# Patient Record
Sex: Female | Born: 1985 | Race: White | Hispanic: No | Marital: Married | Smoking: Current every day smoker
Health system: Southern US, Community
[De-identification: ages and names within clinical notes are randomized; demographics above are authoritative.]

## PROBLEM LIST (undated history)

## (undated) ENCOUNTER — Inpatient Hospital Stay (HOSPITAL_COMMUNITY): Payer: Self-pay

## (undated) DIAGNOSIS — I499 Cardiac arrhythmia, unspecified: Secondary | ICD-10-CM

## (undated) DIAGNOSIS — I471 Supraventricular tachycardia, unspecified: Secondary | ICD-10-CM

## (undated) DIAGNOSIS — D649 Anemia, unspecified: Secondary | ICD-10-CM

## (undated) DIAGNOSIS — R000 Tachycardia, unspecified: Secondary | ICD-10-CM

## (undated) DIAGNOSIS — N200 Calculus of kidney: Secondary | ICD-10-CM

## (undated) DIAGNOSIS — E0590 Thyrotoxicosis, unspecified without thyrotoxic crisis or storm: Secondary | ICD-10-CM

## (undated) DIAGNOSIS — F419 Anxiety disorder, unspecified: Secondary | ICD-10-CM

## (undated) DIAGNOSIS — F32A Depression, unspecified: Secondary | ICD-10-CM

## (undated) DIAGNOSIS — R002 Palpitations: Secondary | ICD-10-CM

## (undated) DIAGNOSIS — R12 Heartburn: Secondary | ICD-10-CM

## (undated) DIAGNOSIS — Z8742 Personal history of other diseases of the female genital tract: Secondary | ICD-10-CM

## (undated) DIAGNOSIS — Z9889 Other specified postprocedural states: Secondary | ICD-10-CM

## (undated) DIAGNOSIS — O26899 Other specified pregnancy related conditions, unspecified trimester: Secondary | ICD-10-CM

## (undated) DIAGNOSIS — F329 Major depressive disorder, single episode, unspecified: Secondary | ICD-10-CM

## (undated) DIAGNOSIS — I10 Essential (primary) hypertension: Secondary | ICD-10-CM

## (undated) HISTORY — DX: Depression, unspecified: F32.A

## (undated) HISTORY — DX: Supraventricular tachycardia, unspecified: I47.10

## (undated) HISTORY — DX: Calculus of kidney: N20.0

## (undated) HISTORY — DX: Anxiety disorder, unspecified: F41.9

## (undated) HISTORY — DX: Tachycardia, unspecified: R00.0

## (undated) HISTORY — DX: Palpitations: R00.2

## (undated) HISTORY — DX: Essential (primary) hypertension: I10

## (undated) HISTORY — PX: ELBOW SURGERY: SHX618

## (undated) HISTORY — DX: Supraventricular tachycardia: I47.1

## (undated) HISTORY — DX: Major depressive disorder, single episode, unspecified: F32.9

---
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
## 2003-01-08 ENCOUNTER — Ambulatory Visit (HOSPITAL_COMMUNITY): Admission: RE | Admit: 2003-01-08 | Discharge: 2003-01-08 | Payer: Self-pay | Admitting: *Deleted

## 2003-02-07 ENCOUNTER — Inpatient Hospital Stay (HOSPITAL_COMMUNITY): Admission: AD | Admit: 2003-02-07 | Discharge: 2003-02-07 | Payer: Self-pay | Admitting: *Deleted

## 2003-03-01 ENCOUNTER — Inpatient Hospital Stay (HOSPITAL_COMMUNITY): Admission: AD | Admit: 2003-03-01 | Discharge: 2003-03-01 | Payer: Self-pay | Admitting: Obstetrics and Gynecology

## 2003-03-03 DIAGNOSIS — I10 Essential (primary) hypertension: Secondary | ICD-10-CM

## 2003-03-03 HISTORY — DX: Essential (primary) hypertension: I10

## 2003-03-12 ENCOUNTER — Ambulatory Visit (HOSPITAL_COMMUNITY): Admission: RE | Admit: 2003-03-12 | Discharge: 2003-03-12 | Payer: Self-pay | Admitting: *Deleted

## 2003-04-15 ENCOUNTER — Inpatient Hospital Stay (HOSPITAL_COMMUNITY): Admission: AD | Admit: 2003-04-15 | Discharge: 2003-04-15 | Payer: Self-pay | Admitting: *Deleted

## 2003-05-21 ENCOUNTER — Inpatient Hospital Stay (HOSPITAL_COMMUNITY): Admission: AD | Admit: 2003-05-21 | Discharge: 2003-05-21 | Payer: Self-pay | Admitting: Obstetrics & Gynecology

## 2003-05-24 ENCOUNTER — Encounter: Admission: RE | Admit: 2003-05-24 | Discharge: 2003-05-24 | Payer: Self-pay | Admitting: *Deleted

## 2003-05-25 ENCOUNTER — Encounter (INDEPENDENT_AMBULATORY_CARE_PROVIDER_SITE_OTHER): Payer: Self-pay | Admitting: Specialist

## 2003-05-25 ENCOUNTER — Inpatient Hospital Stay (HOSPITAL_COMMUNITY): Admission: AD | Admit: 2003-05-25 | Discharge: 2003-05-28 | Payer: Self-pay | Admitting: *Deleted

## 2003-05-30 ENCOUNTER — Inpatient Hospital Stay (HOSPITAL_COMMUNITY): Admission: AD | Admit: 2003-05-30 | Discharge: 2003-05-30 | Payer: Self-pay | Admitting: Obstetrics and Gynecology

## 2003-06-06 ENCOUNTER — Inpatient Hospital Stay (HOSPITAL_COMMUNITY): Admission: AD | Admit: 2003-06-06 | Discharge: 2003-06-06 | Payer: Self-pay | Admitting: Obstetrics and Gynecology

## 2003-06-18 ENCOUNTER — Ambulatory Visit: Admission: RE | Admit: 2003-06-18 | Discharge: 2003-06-18 | Payer: Self-pay | Admitting: *Deleted

## 2007-12-18 ENCOUNTER — Inpatient Hospital Stay (HOSPITAL_COMMUNITY): Admission: AD | Admit: 2007-12-18 | Discharge: 2007-12-18 | Payer: Self-pay | Admitting: Obstetrics and Gynecology

## 2007-12-25 ENCOUNTER — Inpatient Hospital Stay (HOSPITAL_COMMUNITY): Admission: AD | Admit: 2007-12-25 | Discharge: 2007-12-25 | Payer: Self-pay | Admitting: Obstetrics and Gynecology

## 2008-01-01 ENCOUNTER — Inpatient Hospital Stay (HOSPITAL_COMMUNITY): Admission: AD | Admit: 2008-01-01 | Discharge: 2008-01-01 | Payer: Self-pay | Admitting: Obstetrics and Gynecology

## 2008-01-01 HISTORY — PX: DILATION AND CURETTAGE OF UTERUS: SHX78

## 2008-01-02 ENCOUNTER — Encounter (INDEPENDENT_AMBULATORY_CARE_PROVIDER_SITE_OTHER): Payer: Self-pay | Admitting: Obstetrics & Gynecology

## 2008-01-02 ENCOUNTER — Ambulatory Visit: Admission: AD | Admit: 2008-01-02 | Discharge: 2008-01-02 | Payer: Self-pay | Admitting: Obstetrics & Gynecology

## 2009-01-30 ENCOUNTER — Inpatient Hospital Stay (HOSPITAL_COMMUNITY): Admission: AD | Admit: 2009-01-30 | Discharge: 2009-01-30 | Payer: Self-pay | Admitting: Obstetrics and Gynecology

## 2009-03-28 ENCOUNTER — Inpatient Hospital Stay (HOSPITAL_COMMUNITY): Admission: AD | Admit: 2009-03-28 | Discharge: 2009-03-29 | Payer: Self-pay | Admitting: Obstetrics and Gynecology

## 2009-04-19 ENCOUNTER — Ambulatory Visit (HOSPITAL_COMMUNITY): Admission: RE | Admit: 2009-04-19 | Discharge: 2009-04-19 | Payer: Self-pay | Admitting: Obstetrics & Gynecology

## 2009-05-10 ENCOUNTER — Inpatient Hospital Stay (HOSPITAL_COMMUNITY): Admission: AD | Admit: 2009-05-10 | Discharge: 2009-05-10 | Payer: Self-pay | Admitting: Obstetrics and Gynecology

## 2009-06-11 ENCOUNTER — Inpatient Hospital Stay (HOSPITAL_COMMUNITY): Admission: RE | Admit: 2009-06-11 | Discharge: 2009-06-13 | Payer: Self-pay | Admitting: Obstetrics & Gynecology

## 2010-05-21 LAB — CBC
HCT: 28 % — ABNORMAL LOW (ref 36.0–46.0)
HCT: 35.9 % — ABNORMAL LOW (ref 36.0–46.0)
Hemoglobin: 11.7 g/dL — ABNORMAL LOW (ref 12.0–15.0)
Hemoglobin: 9.5 g/dL — ABNORMAL LOW (ref 12.0–15.0)
MCHC: 32.7 g/dL (ref 30.0–36.0)
MCHC: 33.8 g/dL (ref 30.0–36.0)
MCV: 83.3 fL (ref 78.0–100.0)
MCV: 83.8 fL (ref 78.0–100.0)
Platelets: 210 10*3/uL (ref 150–400)
Platelets: 285 10*3/uL (ref 150–400)
RBC: 3.34 MIL/uL — ABNORMAL LOW (ref 3.87–5.11)
RBC: 4.3 MIL/uL (ref 3.87–5.11)
RDW: 15.5 % (ref 11.5–15.5)
RDW: 15.7 % — ABNORMAL HIGH (ref 11.5–15.5)
WBC: 11.6 10*3/uL — ABNORMAL HIGH (ref 4.0–10.5)
WBC: 12.4 10*3/uL — ABNORMAL HIGH (ref 4.0–10.5)

## 2010-05-21 LAB — RPR: RPR Ser Ql: NONREACTIVE

## 2010-05-25 LAB — WET PREP, GENITAL
Clue Cells Wet Prep HPF POC: NONE SEEN
Trich, Wet Prep: NONE SEEN
Yeast Wet Prep HPF POC: NONE SEEN

## 2010-06-03 LAB — URINALYSIS, ROUTINE W REFLEX MICROSCOPIC
Bilirubin Urine: NEGATIVE
Glucose, UA: NEGATIVE mg/dL
Ketones, ur: NEGATIVE mg/dL
Nitrite: NEGATIVE
Protein, ur: NEGATIVE mg/dL
Specific Gravity, Urine: <1.005 — ABNORMAL LOW (ref 1.005–1.030)
Urobilinogen, UA: 0.2 mg/dL (ref 0.0–1.0)
pH: 6.5 (ref 5.0–8.0)

## 2010-06-03 LAB — URINE MICROSCOPIC-ADD ON

## 2010-07-15 NOTE — Op Note (Signed)
[name], Paula Myers           ACCOUNT NO.:  [id number]   MEDICAL RECORD NO.:  [id number]          PATIENT TYPE:  AMB   LOCATION:  DFTL                          FACILITY:  [facility]   PHYSICIAN:  Gerrit Friends. Aldona Bar, M.D.   DATE OF BIRTH:  06/28/1985   DATE OF PROCEDURE:  01/02/2008  DATE OF DISCHARGE:                               OPERATIVE REPORT   PREOPERATIVE DIAGNOSIS:  Incomplete abortion, 10-[redacted] weeks gestation,  blood type O positive.   POSTOPERATIVE DIAGNOSES:  Incomplete abortion, 10-[redacted] weeks gestation,  blood type O positive.   PATHOLOGY:  Pending.   PROCEDURE:  Suction dilation and curettage.   ANESTHESIA:  Intravenous conscious sedation.   HISTORY:  This 25 year old gravida 2, para 1 was seen by me today  earlier in the office with some bleeding.  At that time, her cervix was  closed.  Fetal heart was well heard.  The patient's abdomen was soft.  She was having no cramping and she was advised to maintain expectant  management.  She called approximately 8:00 p.m. this evening with the  increased bleeding and passage of clots and was told to come to [facility] immediately.  I saw her at 8:45 p.m. and there was  approximately 200 mL of clot and blood in the vagina making cervix very  difficult to visualize without appropriate suction, which was  unavailable.  Nonetheless, it was possible to ascertain after multiple  swabbing of the vault but the cervix was at least 1-2 cm dilated with  profuse bleeding.  The patient was having cramping.   Although, her hemoglobin at that time was   Dictation ended at this point.      Gerrit Friends. Aldona Bar, M.D.     RMW/MEDQ  D:  01/02/2008  T:  01/03/2008  Job:  [id number]

## 2010-07-15 NOTE — Op Note (Signed)
NAMESHERINA, STAMMER           ACCOUNT NO.:  [id number]   MEDICAL RECORD NO.:  [id number]          PATIENT TYPE:  AMB   LOCATION:  DFTL                          [facility]  [facility]   PHYSICIAN:  Gerrit Friends. Aldona Bar, M.D.   DATE OF BIRTH:  October 06, 1985   DATE OF PROCEDURE:  DATE OF DISCHARGE:  01/02/2008                               OPERATIVE REPORT   PREOPERATIVE DIAGNOSES:  1. Incomplete abortion.  2. 10-11 week intrauterine pregnancy.  3. Blood type O positive.   POSTOPERATIVE DIAGNOSES:  1. Incomplete abortion.  2. 10-11 week intrauterine pregnancy.  3. Blood type O positive.   PATHOLOGY:  Pending.   PROCEDURE:  Suction, dilatation, and curettage.   ANESTHESIA:  Intravenous conscious sedation.   HISTORY:  This 25 year old gravida 2, para 1 was seen by me earlier  today in the office and at that time, her cervix was closed.  Fetal  heart tone was well heard and there was some mild vaginal bleeding  noted.  The patient was discharged from the office to be managed  expectantly.  At approximately 8:00 p.m. this evening, the patient  called me with the onset of profuse bleeding and passage of clots and  was told to come to [facility] for evaluation.   The patient was seen by me at 8:45 p.m. and at that time had  approximately 250 mL of blood and clot in the vagina and with much  difficulty this was removed using large swabs - suction not available -  and it was determined that the patient's cervix was at least a  centimeter or too dilated.  It was felt the patient had incomplete AB  and decision was made to proceed immediately to the operating room.  A  stat CBC was done, which found the patient's hemoglobin to be 12.   The patient was moved to the operating room and after being placed on  the operating room table in short Allen stirrups in the modified  lithotomy position, intravenous conscious sedation was carried out.  The  patient was prepped and in-and-out cath drained  the bladder of clear  urine.  A speculum was placed in the vagina, which was full of blood and  clot.  This again was suctioned and it became apparent they were  products of conception exuding from the os.  Using a #12 suction  curette, the cavity was thoroughly, gently, and systematically evacuated  of all products of conception, which were sent to Pathology.  Thereafter  curettage with a large standard curette was performed with no further  production of tissue.  Re-suctioning produced no additional tissue.  The  uterus at this time was felt to be approximately 6-week size and firm  and bleeding was noted to be minimal.  Procedure was felt to be complete  and the patient at this time was taken to recovery room in satisfactory  condition having tolerated the procedure well.  Estimated blood loss  between preop and intraop approximately 5-750 mL.  Pathologic specimen  consisted of products of conception.   The patient will be observed in the recovery area and  if stable  discharged to home.  She will start on some ferrous sulfate on a daily  basis as well as using 3 Advils every 6 hours as needed for cramping.  She was given 1 g of Ancef IV in the operating room.  She will be given  an instruction sheet and asked to return to the office in approximately  1 week's time.  Condition on arrival in recovery room satisfactory.      Gerrit Friends. Aldona Bar, M.D.  Electronically Signed     RMW/MEDQ  D:  01/02/2008  T:  01/03/2008  Job:  [id number]

## 2010-07-18 NOTE — Discharge Summary (Signed)
NAME:  Paula Myers, Paula Myers                       ACCOUNT NO.:  [id number]   MEDICAL RECORD NO.:  [id number]                   PATIENT TYPE:  INP   LOCATION:  9136                                 [facility]ACILITY:  [facility]   PHYSICIAN:  Conni Elliot, M.D.             DATE OF BIRTH:  25-Nov-1985   DATE OF ADMISSION:  05/25/2003  DATE OF DISCHARGE:  05/28/2003                                 DISCHARGE SUMMARY   LABORATORY DATA:  Type and Rh, A positive, antibody screen negative,  hemoglobin 12.1, platelets 284, rubella immune, hepatitis B surface antigen  negative, syphilis negative, HIV negative, gonorrhea negative, Chlamydia  negative, GBS negative.   STUDIES:  None.   HOSPITAL COURSE:  This is a 25 year old, G1, P1, who presented at 56 and  3/7ths weeks with brown tinged loss of fluid consistent with meconium,  spontaneous rupture of membranes.  She was admitted and started on Pitocin  for labor augmentation.  She received 1 round of IUPC/amnioinfusion for this  heavy meconium.  Her fetal heart tones started showing recurrent severe  variables, and the patient was a low transverse cesarean section secondary  to these nonreassuring fetal heart tones.  She delivered a viable female  infant with Apgars of 8 at 1 minute and 8 at 5 minutes.  The patient had an  uneventful postpartum course and was discharged 3 days later.  The patient  will be breast feeding, and plans to use Micronor for contraception.   MEDICATIONS AT DISCHARGE:  1. Percocet 5/325, 1-2 tablets q.4h. p.r.n. pain.  2. Ibuprofen 600 mg one p.o. q.6h. p.r.n. for mild pain.  3. Micronor one tablet daily.  Start the second Sunday after delivery.  4. Prenatal vitamins one p.o. daily.   FOLLOW UP VISIT:  The patient is to [facility] up with [facility]  weeks.     Conni Elliot, M.D.                   Conni Elliot, M.D.    ASG/MEDQ  D:  06/12/2003  T:  06/12/2003  Job:  [id number]

## 2010-07-18 NOTE — Op Note (Signed)
NAME:  DUANA, BENEDICT                       ACCOUNT NO.:  [id number]   MEDICAL RECORD NO.:  [id number]                   PATIENT TYPE:  INP   LOCATION:  9136                                 [facility]ILITY[facility]  [facility]   PHYSICIAN:  Phil D. Okey Dupre, M.D.                  DATE OF BIRTH:  [birth year]   DATE OF PROCEDURE:  05/25/2003  DATE OF DISCHARGE:                                 OPERATIVE REPORT   PREOPERATIVE DIAGNOSIS:  Nonreassuring fetal heart tracing with meconium.   POSTOPERATIVE DIAGNOSIS:  Nonreassuring fetal heart tracing with meconium.   PROCEDURE:  Low transverse cesarean section.   SURGEON:  Javier Glazier. Okey Dupre, M.D.   FIRST ASSISTANT:  Franklyn Lor, M.D.   ANESTHESIA:  Epidural.   ESTIMATED BLOOD LOSS:  500 mL.   POSTOPERATIVE CONDITION:  Satisfactory.   Penrose subcutaneous drain and Foley catheter.   The procedure went as follows:  Under satisfactory epidural anesthesia with  the patient in the dorsal supine position and a Foley catheter in the  urinary bladder, the abdomen was then prepped and draped in the usual  sterile manner and then entered through a Pfannenstiel incision situated 3  cm above the symphysis pubis and extending for a total length of 16 cm.  The  abdomen was entered by layers.  On entering the peritoneal cavity, the  visceral peritoneum on the anterior surface of the uterus opened  transversely by sharp dissection with the bladder pushed away from the lower  uterine segment and immediately meconium came out from beneath the bladder  flap.  The uterine opening was expanded by blunt dissection laterally and  from an LOT presentation, the baby was easily delivered and DeLee suction  done immediately on the baby for the thick meconium.  Cord doubly clamped,  divided the baby handed to the pediatrician, and then samples of blood taken  for analysis.  The placenta was manually removed and the uterus explored and  closed with a continuous running locked 0 Vicryl on an  atraumatic needle,  the area observed for bleeding, none was noted, and the fascia was closed  with continuous running alternating locked 0 Vicryl on an atraumatic needle.  Subcutaneous bleeders were controlled with hot cautery and a 7 Penrose drain  was placed into the subcutaneous tissue and exited through a separate stab  wound to the left of the incision and slightly above it, secured with a 1  Vicryl suture, and the skin edges approximated with skin staples, dry  sterile dressings applied, and the patient transferred to the recovery room  in satisfactory condition with a 500 mL blood loss, having tolerated the  procedure well.  Phil D. Okey Dupre, M.D.   PDR/MEDQ  D:  05/25/2003  T:  05/28/2003  Job:  [id number]

## 2010-12-01 LAB — ABO/RH: ABO/RH(D): A POS

## 2010-12-02 LAB — CBC
HCT: 39
Hemoglobin: 12.8
MCHC: 32.9
MCV: 81.6
Platelets: 330
RBC: 4.77
RDW: 16.2 — ABNORMAL HIGH
WBC: 12.3 — ABNORMAL HIGH

## 2010-12-02 LAB — COMPREHENSIVE METABOLIC PANEL
ALT: 16
AST: 18
Albumin: 3.5
Alkaline Phosphatase: 72
BUN: 5 — ABNORMAL LOW
CO2: 23
Calcium: 9.2
Chloride: 105
Creatinine, Ser: 0.59
GFR calc Af Amer: >60
GFR calc non Af Amer: >60
Glucose, Bld: 125 — ABNORMAL HIGH
Potassium: 3.7
Sodium: 136
Total Bilirubin: 0.3
Total Protein: 6.8

## 2011-12-16 ENCOUNTER — Encounter: Payer: Self-pay | Admitting: *Deleted

## 2014-01-09 ENCOUNTER — Emergency Department (HOSPITAL_COMMUNITY)
Admission: EM | Admit: 2014-01-09 | Discharge: 2014-01-09 | Disposition: A | Discharge status: Home / Self Care | Payer: 59 | Attending: Emergency Medicine | Admitting: Emergency Medicine

## 2014-01-09 ENCOUNTER — Encounter (HOSPITAL_COMMUNITY): Payer: Self-pay | Admitting: Emergency Medicine

## 2014-01-09 DIAGNOSIS — Z3A01 Less than 8 weeks gestation of pregnancy: Secondary | ICD-10-CM | POA: Insufficient documentation

## 2014-01-09 DIAGNOSIS — Z79899 Other long term (current) drug therapy: Secondary | ICD-10-CM | POA: Insufficient documentation

## 2014-01-09 DIAGNOSIS — O99281 Endocrine, nutritional and metabolic diseases complicating pregnancy, first trimester: Secondary | ICD-10-CM | POA: Insufficient documentation

## 2014-01-09 DIAGNOSIS — Z3490 Encounter for supervision of normal pregnancy, unspecified, unspecified trimester: Secondary | ICD-10-CM

## 2014-01-09 DIAGNOSIS — R002 Palpitations: Secondary | ICD-10-CM | POA: Insufficient documentation

## 2014-01-09 DIAGNOSIS — Z87442 Personal history of urinary calculi: Secondary | ICD-10-CM | POA: Diagnosis not present

## 2014-01-09 DIAGNOSIS — O9989 Other specified diseases and conditions complicating pregnancy, childbirth and the puerperium: Secondary | ICD-10-CM | POA: Diagnosis present

## 2014-01-09 DIAGNOSIS — O10011 Pre-existing essential hypertension complicating pregnancy, first trimester: Secondary | ICD-10-CM | POA: Diagnosis not present

## 2014-01-09 DIAGNOSIS — E876 Hypokalemia: Secondary | ICD-10-CM | POA: Diagnosis not present

## 2014-01-09 LAB — COMPREHENSIVE METABOLIC PANEL
ALT: 36 U/L — ABNORMAL HIGH (ref 0–35)
AST: 26 U/L (ref 0–37)
Albumin: 3.8 g/dL (ref 3.5–5.2)
Alkaline Phosphatase: 70 U/L (ref 39–117)
Anion gap: 17 — ABNORMAL HIGH (ref 5–15)
BUN: 8 mg/dL (ref 6–23)
CO2: 19 mEq/L (ref 19–32)
Calcium: 9.4 mg/dL (ref 8.4–10.5)
Chloride: 98 mEq/L (ref 96–112)
Creatinine, Ser: 0.64 mg/dL (ref 0.50–1.10)
GFR calc Af Amer: >90 mL/min (ref >90)
GFR calc non Af Amer: >90 mL/min (ref >90)
Glucose, Bld: 116 mg/dL — ABNORMAL HIGH (ref 70–99)
Potassium: 3.5 mEq/L — ABNORMAL LOW (ref 3.7–5.3)
Sodium: 134 mEq/L — ABNORMAL LOW (ref 137–147)
Total Bilirubin: <0.2 mg/dL — ABNORMAL LOW (ref 0.3–1.2)
Total Protein: 7.9 g/dL (ref 6.0–8.3)

## 2014-01-09 LAB — CBC WITH DIFFERENTIAL/PLATELET
Basophils Absolute: 0 10*3/uL (ref 0.0–0.1)
Basophils Relative: 0 % (ref 0–1)
Eosinophils Absolute: 0.1 10*3/uL (ref 0.0–0.7)
Eosinophils Relative: 1 % (ref 0–5)
HCT: 39.7 % (ref 36.0–46.0)
Hemoglobin: 13.4 g/dL (ref 12.0–15.0)
Lymphocytes Relative: 30 % (ref 12–46)
Lymphs Abs: 3.5 10*3/uL (ref 0.7–4.0)
MCH: 27 pg (ref 26.0–34.0)
MCHC: 33.8 g/dL (ref 30.0–36.0)
MCV: 80 fL (ref 78.0–100.0)
Monocytes Absolute: 0.7 10*3/uL (ref 0.1–1.0)
Monocytes Relative: 6 % (ref 3–12)
Neutro Abs: 7.1 10*3/uL (ref 1.7–7.7)
Neutrophils Relative %: 63 % (ref 43–77)
Platelets: 301 10*3/uL (ref 150–400)
RBC: 4.96 MIL/uL (ref 3.87–5.11)
RDW: 14.5 % (ref 11.5–15.5)
WBC: 11.5 10*3/uL — ABNORMAL HIGH (ref 4.0–10.5)

## 2014-01-09 MED ORDER — POTASSIUM CHLORIDE ER 10 MEQ PO TBCR: 10.0000 meq | EXTENDED_RELEASE_TABLET | Freq: Two times a day (BID) | ORAL | Status: DC

## 2014-01-09 MED ORDER — POTASSIUM CHLORIDE CRYS ER 20 MEQ PO TBCR
40.0000 meq | EXTENDED_RELEASE_TABLET | Freq: Once | ORAL | Status: AC
  Administered 2014-01-09: 40 meq via ORAL
  Filled 2014-01-09: qty 2

## 2014-01-09 NOTE — ED Provider Notes (Signed)
CSN: [id number]     Arrival date & time 01/09/14  0057 History   First MD Initiated Contact with Patient 01/09/14 0235     Chief Complaint  Patient presents with  . Palpitations     (Consider location/radiation/quality/duration/timing/severity/associated sxs/prior Treatment) HPI Comments: Patient is a 28 year old female G4 P2 at [redacted] weeks gestation. She presents with complaints of palpitations that have been going off-and-on for the past several days. She denies any chest pain or shortness of breath. She denies any dizziness. She states she had similar episodes with her last pregnancy which turned out to be SVT. She was started on digoxin which she is no longer taking.  Patient is a 28 y.o. female presenting with palpitations. The history is provided by the patient.  Palpitations Palpitations quality:  Regular Onset quality:  Sudden Timing:  Intermittent Progression:  Worsening Chronicity:  Recurrent Relieved by:  Nothing Worsened by:  Nothing tried Ineffective treatments:  None tried   Past Medical History  Diagnosis Date  . Kidney stone   . Tachycardia   . Palpitation   . Hypertension    Past Surgical History  Procedure Laterality Date  . Elbow surgery      Left  . Cesarean section     Family History  Problem Relation Age of Onset  . Hypertension Maternal Grandmother   . Heart disease Maternal Grandmother    History  Substance Use Topics  . Smoking status: Never Smoker   . Smokeless tobacco: Not on file  . Alcohol Use: Yes   OB History    Gravida Para Term Preterm AB TAB SAB Ectopic Multiple Living   1              Review of Systems  Cardiovascular: Positive for palpitations.  All other systems reviewed and are negative.     Allergies  Levaquin and Tetanus toxoids  Home Medications   Prior to Admission medications   Medication Sig Start Date End Date Taking? Authorizing Provider  Prenatal Vit-Fe Fumarate-FA (PRENATAL MULTIVITAMIN) TABS tablet Take 1  tablet by mouth daily at 12 noon.   Yes Historical Provider, MD  digoxin (LANOXIN) 0.25 MG tablet Take 250 mcg by mouth as directed.    Historical Provider, MD  FERROUS SULFATE PO Take by mouth daily.    Historical Provider, MD  Prenatal Vit-Fe Sulfate-FA (PRENATAL VITAMIN PO) Take by mouth daily.    Historical Provider, MD   BP 112/60 mmHg  Pulse 102  Temp(Src) 98.6 F (37 C) (Oral)  Resp 27  Ht 5\' 5"  (1.651 m)  Wt 257 lb (116.574 kg)  BMI 42.77 kg/m2  SpO2 98%  LMP 11/21/2013 Physical Exam  Constitutional: She is oriented to person, place, and time. She appears well-developed and well-nourished. No distress.  HENT:  Head: Normocephalic and atraumatic.  Neck: Normal range of motion. Neck supple.  Cardiovascular: Normal rate and regular rhythm.  Exam reveals no gallop and no friction rub.   No murmur heard. Pulmonary/Chest: Effort normal and breath sounds normal. No respiratory distress. She has no wheezes.  Abdominal: Soft. Bowel sounds are normal. She exhibits no distension. There is no tenderness.  Musculoskeletal: Normal range of motion.  Neurological: She is alert and oriented to person, place, and time.  Skin: Skin is warm and dry. She is not diaphoretic.  Nursing note and vitals reviewed.   ED Course  Procedures (including critical care time) Labs Review Labs Reviewed  CBC WITH DIFFERENTIAL - Abnormal; Notable for the following:  WBC 11.5 (*)    All other components within normal limits  COMPREHENSIVE METABOLIC PANEL - Abnormal; Notable for the following:    Sodium 134 (*)    Potassium 3.5 (*)    Glucose, Bld 116 (*)    ALT 36 (*)    Total Bilirubin <0.2 (*)    Anion gap 17 (*)    All other components within normal limits    Imaging Review No results found.   EKG Interpretation   Date/Time:  Tuesday January 09 2014 01:11:21 EST Ventricular Rate:  106 PR Interval:  118 QRS Duration: 86 QT Interval:  346 QTC Calculation: 459 R Axis:   78 Text  Interpretation:  Sinus tachycardia with frequent Premature  ventricular complexes Otherwise normal ECG Confirmed by DELOS  MD, Shawndrea Rutkowski  ([id number]) on 01/09/2014 2:48:00 AM      MDM   Final diagnoses:  None    Workup reveals a mildly low potassium of 3.5. Her EKG reveals frequent PVCs, however no sustained ectopy. I will recommend follow-up with cardiology to discuss a Holter monitor and return to the ER if her symptoms substantially worsen or change.    Geoffery Lyonsouglas Lona Six, MD 01/09/14 [phone]

## 2014-01-09 NOTE — Discharge Instructions (Signed)
Potassium as prescribed.  Follow-up with cardiology. The contact information for the cardiology offices been provided in this discharge summary. Call in the morning to arrange this appointment.  Return to the ER if your symptoms substantially worsen or change.   Palpitations A palpitation is the feeling that your heartbeat is irregular or is faster than normal. It may feel like your heart is fluttering or skipping a beat. Palpitations are usually not a serious problem. However, in some cases, you may need further medical evaluation. CAUSES  Palpitations can be caused by:  Smoking.  Caffeine or other stimulants, such as diet pills or energy drinks.  Alcohol.  Stress and anxiety.  Strenuous physical activity.  Fatigue.  Certain medicines.  Heart disease, especially if you have a history of irregular heart rhythms (arrhythmias), such as atrial fibrillation, atrial flutter, or supraventricular tachycardia.  An improperly working pacemaker or defibrillator. DIAGNOSIS  To find the cause of your palpitations, your health care provider will take your medical history and perform a physical exam. Your health care provider may also have you take a test called an ambulatory electrocardiogram (ECG). An ECG records your heartbeat patterns over a 24-hour period. You may also have other tests, such as:  Transthoracic echocardiogram (TTE). During echocardiography, sound waves are used to evaluate how blood flows through your heart.  Transesophageal echocardiogram (TEE).  Cardiac monitoring. This allows your health care provider to monitor your heart rate and rhythm in real time.  Holter monitor. This is a portable device that records your heartbeat and can help diagnose heart arrhythmias. It allows your health care provider to track your heart activity for several days, if needed.  Stress tests by exercise or by giving medicine that makes the heart beat faster. TREATMENT  Treatment of  palpitations depends on the cause of your symptoms and can vary greatly. Most cases of palpitations do not require any treatment other than time, relaxation, and monitoring your symptoms. Other causes, such as atrial fibrillation, atrial flutter, or supraventricular tachycardia, usually require further treatment. HOME CARE INSTRUCTIONS   Avoid:  Caffeinated coffee, tea, soft drinks, diet pills, and energy drinks.  Chocolate.  Alcohol.  Stop smoking if you smoke.  Reduce your stress and anxiety. Things that can help you relax include:  A method of controlling things in your body, such as your heartbeats, with your mind (biofeedback).  Yoga.  Meditation.  Physical activity such as swimming, jogging, or walking.  Get plenty of rest and sleep. SEEK MEDICAL CARE IF:   You continue to have a fast or irregular heartbeat beyond 24 hours.  Your palpitations occur more often. SEEK IMMEDIATE MEDICAL CARE IF:  You have chest pain or shortness of breath.  You have a severe headache.  You feel dizzy or you faint. MAKE SURE YOU:  Understand these instructions.  Will watch your condition.  Will get help right away if you are not doing well or get worse. Document Released: 02/14/2000 Document Revised: 02/21/2013 Document Reviewed: 04/17/2011 [facility] Patient Information 2015 [employer] [place]. This information is not intended to replace advice given to you by your health care provider. Make sure you discuss any questions you have with your health care provider.  Hypokalemia Hypokalemia means that the amount of potassium in the blood is lower than normal.Potassium is a chemical, called an electrolyte, that helps regulate the amount of fluid in the body. It also stimulates muscle contraction and helps nerves function properly.Most of the body's potassium is inside of cells, and only  a very small amount is in the blood. Because the amount in the blood is so small, minor changes can be  life-threatening. CAUSES  Antibiotics.  Diarrhea or vomiting.  Using laxatives too much, which can cause diarrhea.  Chronic kidney disease.  Water pills (diuretics).  Eating disorders (bulimia).  Low magnesium level.  Sweating a lot. SIGNS AND SYMPTOMS  Weakness.  Constipation.  Fatigue.  Muscle cramps.  Mental confusion.  Skipped heartbeats or irregular heartbeat (palpitations).  Tingling or numbness. DIAGNOSIS  Your health care provider can diagnose hypokalemia with blood tests. In addition to checking your potassium level, your health care provider may also check other lab tests. TREATMENT Hypokalemia can be treated with potassium supplements taken by mouth or adjustments in your current medicines. If your potassium level is very low, you may need to get potassium through a vein (IV) and be monitored in the hospital. A diet high in potassium is also helpful. Foods high in potassium are:  Nuts, such as peanuts and pistachios.  Seeds, such as sunflower seeds and pumpkin seeds.  Peas, lentils, and lima beans.  Whole grain and bran cereals and breads.  Fresh fruit and vegetables, such as apricots, avocado, bananas, cantaloupe, kiwi, oranges, tomatoes, asparagus, and potatoes.  Orange and tomato juices.  Red meats.  Fruit yogurt. HOME CARE INSTRUCTIONS  Take all medicines as prescribed by your health care provider.  Maintain a healthy diet by including nutritious food, such as fruits, vegetables, nuts, whole grains, and lean meats.  If you are taking a laxative, be sure to follow the directions on the label. SEEK MEDICAL CARE IF:  Your weakness gets worse.  You feel your heart pounding or racing.  You are vomiting or having diarrhea.  You are diabetic and having trouble keeping your blood glucose in the normal range. SEEK IMMEDIATE MEDICAL CARE IF:  You have chest pain, shortness of breath, or dizziness.  You are vomiting or having diarrhea for  more than 2 days.  You faint. MAKE SURE YOU:   Understand these instructions.  Will watch your condition.  Will get help right away if you are not doing well or get worse. Document Released: 02/16/2005 Document Revised: 12/07/2012 Document Reviewed: 08/19/2012 [facility] Patient Information 2015 [place], [place]. This information is not intended to replace advice given to you by your health care provider. Make sure you discuss any questions you have with your health care provider.

## 2014-01-09 NOTE — ED Notes (Addendum)
Pt. reports intermittent palpitations onset last Wednesday , ": face flushed" , denies chest pain , respirations unlabored . Pt. is [redacted] weeks pregnant ( G4P2 ) , denies abdominal cramping or vaginal spotting .

## 2014-01-15 ENCOUNTER — Ambulatory Visit (INDEPENDENT_AMBULATORY_CARE_PROVIDER_SITE_OTHER): Payer: 59 | Admitting: Cardiology

## 2014-01-15 ENCOUNTER — Encounter: Payer: Self-pay | Admitting: Cardiology

## 2014-01-15 VITALS — BP 114/62 | HR 98 | Ht 65.0 in | Wt 246.0 lb

## 2014-01-15 DIAGNOSIS — E876 Hypokalemia: Secondary | ICD-10-CM

## 2014-01-15 DIAGNOSIS — I471 Supraventricular tachycardia: Secondary | ICD-10-CM | POA: Insufficient documentation

## 2014-01-15 DIAGNOSIS — I493 Ventricular premature depolarization: Secondary | ICD-10-CM | POA: Insufficient documentation

## 2014-01-15 DIAGNOSIS — R002 Palpitations: Secondary | ICD-10-CM

## 2014-01-15 LAB — BASIC METABOLIC PANEL
BUN: 9 mg/dL (ref 6–23)
CO2: 20 mEq/L (ref 19–32)
Calcium: 9.4 mg/dL (ref 8.4–10.5)
Chloride: 108 mEq/L (ref 96–112)
Creatinine, Ser: 0.7 mg/dL (ref 0.4–1.2)
GFR: 102.38 mL/min (ref >60.00)
Glucose, Bld: 106 mg/dL — ABNORMAL HIGH (ref 70–99)
Potassium: 4 mEq/L (ref 3.5–5.1)
Sodium: 140 mEq/L (ref 135–145)

## 2014-01-15 NOTE — Patient Instructions (Signed)
Your physician recommends that you have lab work TODAY (BMET).  Your physician has requested that you have an echocardiogram. Echocardiography is a painless test that uses sound waves to create images of your heart. It provides your doctor with information about the size and shape of your heart and how well your heart's chambers and valves are working. This procedure takes approximately one hour. There are no restrictions for this procedure.  Your physician has recommended that you wear an event monitor. Event monitors are medical devices that record the heart's electrical activity. Doctors most often us these monitors to diagnose arrhythmias. Arrhythmias are problems with the speed or rhythm of the heartbeat. The monitor is a small, portable device. You can wear one while you do your normal daily activities. This is usually used to diagnose what is causing palpitations/syncope (passing out).  Your physician recommends that you schedule a follow-up appointment in: 3 months with Dr. Mayford Knifeurner.

## 2014-01-15 NOTE — Progress Notes (Signed)
[address] [place], [place]  [address] Phone: [phone] Fax:  [phone]  Date:  01/15/2014   ID:  Paula KimStephanie D Overman, DOB 04/01/1985, MRN [id number]  PCP:  No primary care provider on file.  Cardiologist:  Armanda Magicraci Roben Tatsch, MD    History of Present Illness: Paula KimStephanie D Overman is a 28 y.o. female with a history of HTN and palpitations who is in first trimester of her pregnancy and presented to the ER with complaints of palpitations.  She says that it has been occurring off and on for the past week.  She denies any chest pain, SOB, DOE, Le edema or syncope.  She had similar problems with her last pregnancy and was diagnosed with SVT and was started on digoxin but has stopped that.  EKG in ER showed NSR with frequent PVC's.  She was found to have a borderline low potassium and was started on potassium suppl for a few doses.  After a few days on the potassium her palpitations significantly improved.  She has cut out all caffiene.  She is now here for further evaluation.    Wt Readings from Last 3 Encounters:  01/15/14 246 lb (111.585 kg)  01/09/14 257 lb (116.574 kg)     Past Medical History  Diagnosis Date  . Kidney stone   . Tachycardia   . Palpitation   . Hypertension   . SVT (supraventricular tachycardia)     Current Outpatient Prescriptions  Medication Sig Dispense Refill  . Prenatal Vit-Fe Fumarate-FA (PRENATAL MULTIVITAMIN) TABS tablet Take 1 tablet by mouth daily at 12 noon.     No current facility-administered medications for this visit.    Allergies:    Allergies  Allergen Reactions  . Levaquin [Levofloxacin In D5w] Anaphylaxis  . Tetanus Toxoids Swelling    Social History:  The patient  reports that she has never smoked. She does not have any smokeless tobacco history on file. She reports that she drinks alcohol. She reports that she does not use illicit drugs.   Family History:  The patient's family history includes Heart disease in her maternal  grandmother; Hypertension in her maternal grandmother.   ROS:  Please see the history of present illness.      All other systems reviewed and negative.   PHYSICAL EXAM: VS:  BP 114/62 mmHg  Pulse 98  Ht 5\' 5"  (1.651 m)  Wt 246 lb (111.585 kg)  BMI 40.94 kg/m2  LMP 11/21/2013 Well nourished, well developed, in no acute distress HEENT: normal Neck: no JVD Cardiac:  normal S1, S2; RRR; no murmur Lungs:  clear to auscultation bilaterally, no wheezing, rhonchi or rales Abd: soft, nontender, no hepatomegaly Ext: no edema Skin: warm and dry Neuro:  CNs 2-12 intact, no focal abnormalities noted  EKG:  In ER -Sinus tachycardia with occasional PVC's  Today: NSR at 98bpm   ASSESSMENT AND PLAN:  1. Palpitations with a history of SVT and PVC's noted on EKG - I will check an event monitor to rule out any other arrhythmias.  Her palpitations are not everyday so Holter not appropriate 2. History of SVT with prior pregnancy. 3.   2 month gestation 4.   PVC's most likely triggered by low potassium and caffeine.  I will repeat a BMET today to make sure her K is ok.  I will get a 2D echo to assess LVF.  She will continue to stay off caffiene. 5.  Hypokalemia - recheck BMEt  Followup with me  in 3 months  Signed, Armanda Magicraci Chad Donoghue, MD [facility] 01/15/2014 10:13 AM

## 2014-01-18 ENCOUNTER — Ambulatory Visit (HOSPITAL_COMMUNITY): Payer: 59 | Attending: Cardiology | Admitting: Radiology

## 2014-01-18 DIAGNOSIS — I493 Ventricular premature depolarization: Secondary | ICD-10-CM | POA: Insufficient documentation

## 2014-01-18 DIAGNOSIS — R002 Palpitations: Secondary | ICD-10-CM | POA: Diagnosis present

## 2014-01-18 NOTE — Progress Notes (Signed)
Echocardiogram performed.  

## 2014-01-22 ENCOUNTER — Telehealth: Payer: Self-pay | Admitting: Cardiology

## 2014-01-22 NOTE — Telephone Encounter (Signed)
Informed of normal ECHO results.

## 2014-01-22 NOTE — Telephone Encounter (Signed)
New msg   Patient calling to find out results of echocardiogram. Please contact at [phone].

## 2014-01-24 ENCOUNTER — Other Ambulatory Visit: Payer: Self-pay | Admitting: Obstetrics & Gynecology

## 2014-01-24 ENCOUNTER — Encounter (INDEPENDENT_AMBULATORY_CARE_PROVIDER_SITE_OTHER): Payer: 59

## 2014-01-24 ENCOUNTER — Encounter: Payer: Self-pay | Admitting: *Deleted

## 2014-01-24 DIAGNOSIS — R002 Palpitations: Secondary | ICD-10-CM

## 2014-01-24 NOTE — Progress Notes (Signed)
Patient ID: Paula Myers, female   DOB: 1985-12-09, 28 y.o.   MRN: [id number] [employer] 30 day cardiac event monitor applied to patient.

## 2014-01-30 LAB — CYTOLOGY - PAP

## 2014-02-27 ENCOUNTER — Other Ambulatory Visit: Payer: Self-pay | Admitting: Obstetrics & Gynecology

## 2014-02-27 LAB — OB RESULTS CONSOLE RUBELLA ANTIBODY, IGM: Rubella: IMMUNE

## 2014-02-27 LAB — OB RESULTS CONSOLE RPR: RPR: NONREACTIVE

## 2014-02-27 LAB — OB RESULTS CONSOLE ANTIBODY SCREEN: ANTIBODY SCREEN: NEGATIVE

## 2014-02-27 LAB — OB RESULTS CONSOLE ABO/RH: RH Type: POSITIVE

## 2014-02-27 LAB — OB RESULTS CONSOLE GC/CHLAMYDIA
CHLAMYDIA, DNA PROBE: NEGATIVE
GC PROBE AMP, GENITAL: NEGATIVE

## 2014-02-27 LAB — OB RESULTS CONSOLE HIV ANTIBODY (ROUTINE TESTING): HIV: NONREACTIVE

## 2014-02-27 LAB — OB RESULTS CONSOLE HEPATITIS B SURFACE ANTIGEN: Hepatitis B Surface Ag: NEGATIVE

## 2014-03-02 NOTE — L&D Delivery Note (Signed)
Cesarean Section Procedure Note  Indications: previous uterine incision kerr x2   Pre-operative Diagnosis: 39 week 1 day pregnancy, prior LTCS x 2 and desired permanent sterilization  Post-operative Diagnosis: same  Surgeon: Wynonia Hazard   Assistants: Marlow Baars  Anesthesia: Spinal anesthesia  ASA Class: 2  Procedure Details   The patient was counseled about the risks, benefits, complications of the cesarean section. The patient concurred with the proposed plan, giving informed consent.  The site of surgery properly noted/marked. The patient was taken to Operating Room # 9, identified as Paula Myers and the procedure verified as C-Section Delivery. A Time Out was held and the above information confirmed.  After epidural was found to adequate , the patient was placed in the dorsal supine position with a leftward tilt, draped and prepped in the usual sterile manner. A Pfannenstiel incision was made and carried down through the subcutaneous tissue to the fascia.  The fascia was incised in the midline and the fascial incision was extended laterally with Mayo scissors. The superior aspect of the fascial incision was grasped with Coker clamps x2, tented up and the rectus muscles dissected off sharply with the scalpel. The rectus was then dissected off with blunt dissection and Mayo scissors inferiorly. The rectus muscles were separated in the midline. The abdominal peritoneum was identified, tented up, entered sharply, and the incision was extended superiorly and inferiorly with good visualization of the bladder. The Alexis retractor was then deployed. The scalpel was then used to make a low transverse incision on the uterus which was extended laterally with  blunt dissection. The fetal vertex was identified, the head was brought out from the pelvis.  The head was then delivered easily through the uterine incision followed by the body. The A live female infant was bulb suctioned on the  operative field cried vigorously, cord was clamped and cut and the infant was passed to the waiting neonatologist. Apgars 9/9. Placenta was then delivered spontaneously, intact and appear normal, the uterus was cleared of all clot and debris. The uterus was brought out of the abdomen. The uterine incision was repaired with #0 Monocryl in running locked fashion. A second imbricating suture was performed using the same suture. The incision was still oozing and several figure of 8 sutures were placed with 0-chromic. There was bleeding on the right side of the incision from open sinuses which was alleviated with a suture of 0-chromic. Hemostasis was achieved. Ovaries and tubes were inspected and normal.   The Filshie clip was then applied to the right and then to the left tube without  difficulty, observing that the clip surrounded the tube entirely and the anvil had good positioning within the mesosalpinx. There was noted to be no bleeding of the tube or the mesosalpinx. ?  The Alexis retractor was removed. The abdominal cavity was cleared of all clot and debris. The abdominal peritoneum was reapproximated with 2-0 chromic  in a running fashion, the rectus muscles was reapproximated with #2 chromic in interrupted fashion. The fascia was closed with 0 PDS in a running fashion from each end meeting in the middle.  The subcuticular layer was irrigated and all bleeders cauterized.  Exparel dilution 20 in 30 of 0.25% Marcaine was injected into the fascial and subcutaneous layer. The subcutaneous layer was re-approximated with 2-0 plain.   The skin was closed with 4-0 vicryl in a subcuticular fashion using a Mellody Dance needle. The incision was dressed with benzoine, steri strips and pressure dressing.  All sponge lap and needle counts were correct x3. Patient tolerated the procedure well and recovered in stable condition following the procedure.  Instrument, sponge, and needle counts were correct prior the abdominal  closure and at the conclusion of the case.   Findings: Live female infant, Apgars 9/9, clear amniotic fluid, normal appearing placenta, normal uterus, bilateral tubes and ovaries  Estimated Blood Loss:         Drains: Foley catheter         Specimens: Placenta to L&D         Implants:  Filshie Clips         Complications:  None; patient tolerated the procedure well.         Disposition: PACU - hemodynamically stable.   Paula Myers STACIA

## 2014-03-04 ENCOUNTER — Inpatient Hospital Stay (HOSPITAL_COMMUNITY)
Admission: AD | Admit: 2014-03-04 | Discharge: 2014-03-04 | Disposition: A | Discharge status: Home / Self Care | Payer: 59 | Source: Ambulatory Visit | Attending: Obstetrics and Gynecology | Admitting: Obstetrics and Gynecology

## 2014-03-04 ENCOUNTER — Encounter (HOSPITAL_COMMUNITY): Payer: Self-pay | Admitting: *Deleted

## 2014-03-04 DIAGNOSIS — R002 Palpitations: Secondary | ICD-10-CM | POA: Insufficient documentation

## 2014-03-04 DIAGNOSIS — O208 Other hemorrhage in early pregnancy: Secondary | ICD-10-CM | POA: Insufficient documentation

## 2014-03-04 DIAGNOSIS — O4692 Antepartum hemorrhage, unspecified, second trimester: Secondary | ICD-10-CM

## 2014-03-04 DIAGNOSIS — N930 Postcoital and contact bleeding: Secondary | ICD-10-CM

## 2014-03-04 DIAGNOSIS — O209 Hemorrhage in early pregnancy, unspecified: Secondary | ICD-10-CM | POA: Diagnosis present

## 2014-03-04 DIAGNOSIS — Z3A14 14 weeks gestation of pregnancy: Secondary | ICD-10-CM | POA: Diagnosis not present

## 2014-03-04 HISTORY — DX: Personal history of other diseases of the female genital tract: Z87.42

## 2014-03-04 HISTORY — DX: Personal history of other diseases of the female genital tract: Z98.890

## 2014-03-04 LAB — URINALYSIS, ROUTINE W REFLEX MICROSCOPIC
Bilirubin Urine: NEGATIVE
Glucose, UA: NEGATIVE mg/dL
KETONES UR: NEGATIVE mg/dL
Leukocytes, UA: NEGATIVE
Nitrite: NEGATIVE
PH: 6 (ref 5.0–8.0)
Protein, ur: NEGATIVE mg/dL
Urobilinogen, UA: 0.2 mg/dL (ref 0.0–1.0)

## 2014-03-04 LAB — URINE MICROSCOPIC-ADD ON

## 2014-03-04 NOTE — MAU Note (Signed)
Pt states began bleeding last pm after sex. Was pretty heavy at first, then laid down to see if it would stop, and has since seen light blood flow. Hx cervical polyps. Has seen eraser head sized clots, however no tissue.

## 2014-03-04 NOTE — MAU Note (Signed)
Pt presents to MAU with complaints of vaginal bleeding and lower abdominal pain after intercourse last pm.

## 2014-03-04 NOTE — MAU Provider Note (Signed)
History     CSN: [id number]  Arrival date and time: 03/04/14 1134   First Provider Initiated Contact with Patient 03/04/14 1323      Chief Complaint  Patient presents with  . Vaginal Bleeding  . Abdominal Cramping   HPI Paula Myers 29 y.o. [id number]  presents to MAU complaining of vaginal bleeding that started last evening following intercourse.  She had heavy bleeding like a period initially but since this am it has been more like spotting.  She has a remote h/o cervical polyps and questions if this is cause.  She was seen in office on 12/29, had u/s at that appt and reports that there were no concerns. She has some abdominal cramping of 2/10 but denies weakness, fever, dizziness, sob, chest pain.  She is having occasional palpitations occasionally as has been ongoing this pregnancy.   OB History    Gravida Para Term Preterm AB TAB SAB Ectopic Multiple Living   Past Medical History  Diagnosis Date  . Kidney stone   . Tachycardia   . Palpitation   . Hypertension   . SVT (supraventricular tachycardia)   . H/O cervical polypectomy     Past Surgical History  Procedure Laterality Date  . Elbow surgery      Left  . Cesarean section      Family History  Problem Relation Age of Onset  . Hypertension Maternal Grandmother   . Heart disease Maternal Grandmother     History  Substance Use Topics  . Smoking status: Never Smoker   . Smokeless tobacco: Never Used  . Alcohol Use: No    Allergies:  Allergies  Allergen Reactions  . Levaquin [Levofloxacin In D5w] Anaphylaxis  . Tetanus Toxoids Swelling    Prescriptions prior to admission  Medication Sig Dispense Refill Last Dose  . acetaminophen (TYLENOL) 325 MG tablet Take by mouth every 6 (six) hours as needed for mild pain, moderate pain or headache.   03/01/2014  . Prenatal Vit-Fe Fumarate-FA (PRENATAL MULTIVITAMIN) TABS tablet Take 1 tablet by mouth daily at 12 noon.   03/03/2014 at  Unknown time    ROS  .pertinent ros in hpi Physical Exam   Blood pressure 159/85, pulse 99, temperature 98.6 F (37 C), resp. rate 18, height  (1.651 m), weight 254 lb (115.214 kg), last menstrual period 11/21/2013.  Physical Exam  Constitutional: She is oriented to person, place, and time. She appears well-developed and well-nourished. No distress.  HENT:  Head: Normocephalic and atraumatic.  Eyes: EOM are normal.  Neck: Normal range of motion.  Cardiovascular: Normal rate and regular rhythm.   Respiratory: Effort normal and breath sounds normal. No respiratory distress.  GI: Soft. Bowel sounds are normal. She exhibits no distension. There is no tenderness.  Genitourinary:  Small amt of dark bloody mucus seen in cervical os.   Cervix with small papules on surface at 12 o'clock.  Musculoskeletal: Normal range of motion.  Neurological: She is alert and oriented to person, place, and time.  Skin: Skin is warm and dry.  Psychiatric: She has a normal mood and affect.    MAU Course  Procedures  MDM Discussed with Dr. Claiborne Billings.  No need for additional workup at this time.  Pt to remain on pelvic rest until anatomy scan.    Assessment and Plan  A: Vaginal bleeding in pregnancy, post-coital  P: Discharge to home Pelvic rest  until anatomy scan in office Follow up in clinic as needed/as scheduled Patient may return to MAU as needed or if her condition were to change or worsen   Bertram Denver 03/04/2014, 1:24 PM

## 2014-03-04 NOTE — Discharge Instructions (Signed)
Vaginal Bleeding During Pregnancy, Second Trimester °A small amount of bleeding (spotting) from the vagina is relatively common in pregnancy. It usually stops on its own. Various things can cause bleeding or spotting in pregnancy. Some bleeding may be related to the pregnancy, and some may not. Sometimes the bleeding is normal and is not a problem. However, bleeding can also be a sign of something serious. Be sure to tell your health care provider about any vaginal bleeding right away. °Some possible causes of vaginal bleeding during the second trimester include: °· Infection, inflammation, or growths on the cervix.   °· The placenta may be partially or completely covering the opening of the cervix inside the uterus (placenta previa). °· The placenta may have separated from the uterus (abruption of the placenta).   °· You may be having early (preterm) labor.   °· The cervix may not be strong enough to keep a baby inside the uterus (cervical insufficiency).   °· Tiny cysts may have developed in the uterus instead of pregnancy tissue (molar pregnancy).  °HOME CARE INSTRUCTIONS  °Watch your condition for any changes. The following actions may help to lessen any discomfort you are feeling: °· Follow your health care provider's instructions for limiting your activity. If your health care provider orders bed rest, you may need to stay in bed and only get up to use the bathroom. However, your health care provider may allow you to continue light activity. °· If needed, make plans for someone to help with your regular activities and responsibilities while you are on bed rest. °· Keep track of the number of pads you use each day, how often you change pads, and how soaked (saturated) they are. Write this down. °· Do not use tampons. Do not douche. °· Do not have sexual intercourse or orgasms until approved by your health care provider. °· If you pass any tissue from your vagina, save the tissue so you can show it to your  health care provider. °· Only take over-the-counter or prescription medicines as directed by your health care provider. °· Do not take aspirin because it can make you bleed. °· Do not exercise or perform any strenuous activities or heavy lifting without your health care provider's permission. °· Keep all follow-up appointments as directed by your health care provider. °SEEK MEDICAL CARE IF: °· You have any vaginal bleeding during any part of your pregnancy. °· You have cramps or labor pains. °· You have a fever, not controlled by medicine. °SEEK IMMEDIATE MEDICAL CARE IF:  °· You have severe cramps in your back or belly (abdomen). °· You have contractions. °· You have chills. °· You pass large clots or tissue from your vagina. °· Your bleeding increases. °· You feel light-headed or weak, or you have fainting episodes. °· You are leaking fluid or have a gush of fluid from your vagina. °MAKE SURE YOU: °· Understand these instructions. °· Will watch your condition. °· Will get help right away if you are not doing well or get worse. °Document Released: 11/26/2004 Document Revised: 02/21/2013 Document Reviewed: 10/24/2012 °ExitCare® Patient Information ©2015 [employer]. This information is not intended to replace advice given to you by your health care provider. Make sure you discuss any questions you have with your health care provider. ° °

## 2014-03-06 ENCOUNTER — Telehealth: Payer: Self-pay | Admitting: Cardiology

## 2014-03-06 NOTE — Telephone Encounter (Signed)
Patient informed of monitor results and verbal understanding expressed.  Patient st she has increased her potassium intake and it helps with the "skipping" of her heart." Patient denies low dose beta blocker at this time.   To Dr. Mayford Knifeurner for review.

## 2014-03-06 NOTE — Telephone Encounter (Signed)
Please let patient know that heart monitor showed NSR with HR range from 73-131bpm with occasional PVC's.   The PVC's are benign but if they are bothering her we can try a low dose of beta blocker if she would like

## 2014-04-03 ENCOUNTER — Ambulatory Visit (INDEPENDENT_AMBULATORY_CARE_PROVIDER_SITE_OTHER): Payer: 59 | Admitting: Cardiology

## 2014-04-03 ENCOUNTER — Encounter: Payer: Self-pay | Admitting: Cardiology

## 2014-04-03 VITALS — BP 120/74 | HR 90 | Ht 65.0 in | Wt 255.0 lb

## 2014-04-03 DIAGNOSIS — E876 Hypokalemia: Secondary | ICD-10-CM

## 2014-04-03 DIAGNOSIS — I471 Supraventricular tachycardia: Secondary | ICD-10-CM

## 2014-04-03 DIAGNOSIS — I493 Ventricular premature depolarization: Secondary | ICD-10-CM

## 2014-04-03 DIAGNOSIS — R002 Palpitations: Secondary | ICD-10-CM

## 2014-04-03 NOTE — Patient Instructions (Signed)
Your physician recommends that you continue on your current medications as directed. Please refer to the Current Medication list given to you today.    FOLLOW UP WITH DR TURNER IN 3  MONTHS

## 2014-04-03 NOTE — Progress Notes (Addendum)
Cardiology Office Note   Date:  04/03/2014   ID:  Paula Myers, DOB [birth year], MRN [id number]  PCP:  No primary care provider on file.  Cardiologist:   Quintella Reichert, MD   Chief Complaint  Patient presents with  . Palpitations      History of Present Illness: Paula Myers is a 29 y.o. female with a history of HTN and palpitations who is in second trimester of her pregnancy.  During her first trimester she presented to the ER with complaints of palpitations.  She denied any chest pain, SOB, DOE, Le edema or syncope. She had similar problems with her last pregnancy and was diagnosed with SVT and was started on digoxin but stopped that. EKG in ER showed NSR with frequent PVC's. She was found to have a borderline low potassium and was started on potassium suppl for a few doses. After a few days on the potassium her palpitations significantly improved. She still drinks 1 cup of coke daily.   She is now here for followup.  2D echo showed normal LVF.  Her event monitor showed PVC's.  She still feels the PVC's but notices them more when she lays down at night and not as much during the day.     Past Medical History  Diagnosis Date  . Kidney stone   . Tachycardia   . Palpitation   . Hypertension   . SVT (supraventricular tachycardia)   . H/O cervical polypectomy     Past Surgical History  Procedure Laterality Date  . Elbow surgery      Left  . Cesarean section       Current Outpatient Prescriptions  Medication Sig Dispense Refill  . acetaminophen (TYLENOL) 325 MG tablet Take by mouth every 6 (six) hours as needed for mild pain, moderate pain or headache.    . Prenatal Vit-Fe Fumarate-FA (PRENATAL MULTIVITAMIN) TABS tablet Take 1 tablet by mouth daily at 12 noon.     No current facility-administered medications for this visit.    Allergies:   Levaquin and Tetanus toxoids    Social History:  The patient  reports that she has never smoked. She has never  used smokeless tobacco. She reports that she does not drink alcohol or use illicit drugs.   Family History:  The patient's family history includes Heart disease in her maternal grandmother; Hypertension in her maternal grandmother.    ROS:  Please see the history of present illness.   Otherwise, review of systems are positive for none.   All other systems are reviewed and negative.    PHYSICAL EXAM: VS:  BP 120/74 mmHg  Pulse 90  Ht  (1.651 m)  Wt 255 lb (115.667 kg)  BMI 42.43 kg/m2  LMP 11/21/2013 , BMI Body mass index is 42.43 kg/(m^2). GEN: Well nourished, well developed, in no acute distress HEENT: normal Neck: no JVD, carotid bruits, or masses Cardiac: RRR; no murmurs, rubs, or gallops,no edema  Respiratory:  clear to auscultation bilaterally, normal work of breathing GI: soft, nontender, nondistended, + BS MS: no deformity or atrophy Skin: warm and dry, no rash Neuro:  Strength and sensation are intact Psych: euthymic mood, full affect   EKG:  EKG is not ordered today.    Recent Labs: 01/09/2014: ALT 36*; Hemoglobin 13.4; Platelets 301 01/15/2014: BUN 9; Creatinine 0.7; Potassium 4.0; Sodium 140    Lipid Panel No results found for: CHOL, TRIG, HDL, CHOLHDL, VLDL, LDLCALC, LDLDIRECT    Wt Readings from  Last 3 Encounters:  04/03/14 255 lb (115.667 kg)  03/04/14 254 lb (115.214 kg)  01/15/14 246 lb (111.585 kg)     ASSESSMENT AND PLAN:  1.  Palpitations with a history of SVT and PVC's noted on EKG - Event monitor showed PVC's.   2.  History of SVT with prior pregnancy. 3. 5 month gestation 4. PVC's most likely triggered by low potassium and caffeine.2D echo was normal. She has been drinking one coke daily and I have encouraged her to try to avoid caffeine all together for now.   5. Hypokalemia - resolved by last BMET     Current medicines are reviewed at length with the patient today.  The patient does not have concerns regarding  medicines.  The following changes have been made:  no change    Disposition:   FU with me in 3 months Signed, Quintella ReichertURNER,Nabilah Davoli R, MD  04/03/2014 10:11 PM    [facility] [address], [place], [place]  [address] Phone: [phone]; Fax: [phone]

## 2014-05-23 LAB — OB RESULTS CONSOLE RPR: RPR: NONREACTIVE

## 2014-07-14 NOTE — Progress Notes (Signed)
This encounter was created in error - please disregard.

## 2014-07-16 ENCOUNTER — Encounter: Payer: 59 | Admitting: Cardiology

## 2014-08-02 ENCOUNTER — Other Ambulatory Visit: Payer: Self-pay | Admitting: Obstetrics & Gynecology

## 2014-08-02 LAB — OB RESULTS CONSOLE GBS: GBS: POSITIVE

## 2014-08-03 ENCOUNTER — Encounter: Payer: Self-pay | Admitting: Cardiology

## 2014-08-20 ENCOUNTER — Encounter (HOSPITAL_COMMUNITY): Payer: Self-pay

## 2014-08-20 ENCOUNTER — Encounter (HOSPITAL_COMMUNITY): Payer: Self-pay | Admitting: Anesthesiology

## 2014-08-20 NOTE — Anesthesia Preprocedure Evaluation (Addendum)
Anesthesia Evaluation  Patient identified by MRN, date of birth, ID band Patient awake    Reviewed: Allergy & Precautions, NPO status , Patient's Chart, lab work & pertinent test results  Airway Mallampati: III       Dental  (+) Dental Advisory Given   Pulmonary neg pulmonary ROS,  breath sounds clear to auscultation        Cardiovascular hypertension, + dysrhythmias Supra Ventricular Tachycardia Rhythm:Regular Rate:Normal     Neuro/Psych negative neurological ROS  negative psych ROS   GI/Hepatic   Endo/Other  Morbid obesity  Renal/GU Renal diseaseHx/o Renal calculi  negative genitourinary   Musculoskeletal negative musculoskeletal ROS (+)   Abdominal (+) + obese,   Peds  Hematology   Anesthesia Other Findings   Reproductive/Obstetrics (+) Pregnancy Previous C/Section Desires Sterilization                            Anesthesia Physical Anesthesia Plan  ASA: III  Anesthesia Plan: Spinal   Post-op Pain Management:    Induction:   Airway Management Planned: Natural Airway  Additional Equipment:   Intra-op Plan:   Post-operative Plan:   Informed Consent: I have reviewed the patients History and Physical, chart, labs and discussed the procedure including the risks, benefits and alternatives for the proposed anesthesia with the patient or authorized representative who has indicated his/her understanding and acceptance.     Plan Discussed with: Anesthesiologist, CRNA and Surgeon  Anesthesia Plan Comments:         Anesthesia Quick Evaluation

## 2014-08-21 ENCOUNTER — Encounter (HOSPITAL_COMMUNITY): Payer: Self-pay

## 2014-08-21 ENCOUNTER — Other Ambulatory Visit: Payer: Self-pay | Admitting: Obstetrics & Gynecology

## 2014-08-21 ENCOUNTER — Encounter (HOSPITAL_COMMUNITY)
Admission: RE | Admit: 2014-08-21 | Discharge: 2014-08-21 | Disposition: A | Discharge status: Home / Self Care | Payer: 59 | Source: Ambulatory Visit | Attending: Obstetrics & Gynecology | Admitting: Obstetrics & Gynecology

## 2014-08-21 HISTORY — DX: Other specified pregnancy related conditions, unspecified trimester: R12

## 2014-08-21 HISTORY — DX: Anemia, unspecified: D64.9

## 2014-08-21 HISTORY — DX: Cardiac arrhythmia, unspecified: I49.9

## 2014-08-21 HISTORY — DX: Other specified pregnancy related conditions, unspecified trimester: O26.899

## 2014-08-21 LAB — CBC
HCT: 33.5 % — ABNORMAL LOW (ref 36.0–46.0)
HEMOGLOBIN: 11.3 g/dL — AB (ref 12.0–15.0)
MCH: 28 pg (ref 26.0–34.0)
MCHC: 33.7 g/dL (ref 30.0–36.0)
MCV: 82.9 fL (ref 78.0–100.0)
Platelets: 259 10*3/uL (ref 150–400)
RBC: 4.04 MIL/uL (ref 3.87–5.11)
RDW: 14.4 % (ref 11.5–15.5)
WBC: 9.5 10*3/uL (ref 4.0–10.5)

## 2014-08-21 LAB — TYPE AND SCREEN
ABO/RH(D): A POS
Antibody Screen: NEGATIVE

## 2014-08-21 NOTE — Patient Instructions (Signed)
Your procedure is scheduled on: 08/22/14  Enter through the Main Entrance at : 6am Pick up desk phone and dial [phone] and inform us of your arrival.  Please call [phone] if you have any problems the morning of surgery.  Remember: Do not eat food or drink liquids, including water, after midnight:tonight   You may brush your teeth the morning of surgery.   DO NOT wear jewelry, eye make-up, lipstick,body lotion, or dark fingernail polish.  (Polished toes are ok) You may wear deodorant.  If you are to be admitted after surgery, leave suitcase in car until your room has been assigned. Patients discharged on the day of surgery will not be allowed to drive home. Wear loose fitting, comfortable clothes for your ride home.

## 2014-08-22 ENCOUNTER — Inpatient Hospital Stay (HOSPITAL_COMMUNITY)
Admission: RE | Admit: 2014-08-22 | Discharge: 2014-08-24 | DRG: 765 | Disposition: A | Discharge status: Home / Self Care | Payer: 59 | Source: Ambulatory Visit | Attending: Obstetrics & Gynecology | Admitting: Obstetrics & Gynecology

## 2014-08-22 ENCOUNTER — Encounter (HOSPITAL_COMMUNITY): Payer: Self-pay | Admitting: *Deleted

## 2014-08-22 ENCOUNTER — Inpatient Hospital Stay (HOSPITAL_COMMUNITY): Payer: 59 | Admitting: Anesthesiology

## 2014-08-22 ENCOUNTER — Encounter (HOSPITAL_COMMUNITY)
Admission: RE | Disposition: A | Discharge status: Home / Self Care | Payer: Self-pay | Source: Ambulatory Visit | Attending: Obstetrics & Gynecology

## 2014-08-22 DIAGNOSIS — O99824 Streptococcus B carrier state complicating childbirth: Secondary | ICD-10-CM | POA: Diagnosis present

## 2014-08-22 DIAGNOSIS — I471 Supraventricular tachycardia: Secondary | ICD-10-CM | POA: Diagnosis present

## 2014-08-22 DIAGNOSIS — O3421 Maternal care for scar from previous cesarean delivery: Principal | ICD-10-CM | POA: Diagnosis present

## 2014-08-22 DIAGNOSIS — Z3A39 39 weeks gestation of pregnancy: Secondary | ICD-10-CM | POA: Diagnosis present

## 2014-08-22 DIAGNOSIS — Z98891 History of uterine scar from previous surgery: Secondary | ICD-10-CM

## 2014-08-22 DIAGNOSIS — O9942 Diseases of the circulatory system complicating childbirth: Secondary | ICD-10-CM | POA: Diagnosis present

## 2014-08-22 DIAGNOSIS — N858 Other specified noninflammatory disorders of uterus: Secondary | ICD-10-CM | POA: Diagnosis present

## 2014-08-22 DIAGNOSIS — Z302 Encounter for sterilization: Secondary | ICD-10-CM | POA: Diagnosis not present

## 2014-08-22 HISTORY — DX: History of uterine scar from previous surgery: Z98.891

## 2014-08-22 LAB — RPR: RPR: NONREACTIVE

## 2014-08-22 SURGERY — Surgical Case
Anesthesia: Spinal | Laterality: Bilateral

## 2014-08-22 MED ORDER — KETOROLAC TROMETHAMINE 30 MG/ML IJ SOLN
30.0000 mg | Freq: Four times a day (QID) | INTRAMUSCULAR | Status: AC | PRN
  Administered 2014-08-22: 30 mg via INTRAMUSCULAR

## 2014-08-22 MED ORDER — SODIUM CHLORIDE 0.9 % IJ SOLN: 3.0000 mL | INTRAMUSCULAR | Status: DC | PRN

## 2014-08-22 MED ORDER — BUPIVACAINE LIPOSOME 1.3 % IJ SUSP
20.0000 mL | Freq: Once | INTRAMUSCULAR | Status: AC
  Administered 2014-08-22: 20 mL
  Filled 2014-08-22: qty 20

## 2014-08-22 MED ORDER — CEFAZOLIN SODIUM-DEXTROSE 2-3 GM-% IV SOLR
INTRAVENOUS | Status: DC | PRN
  Administered 2014-08-22: 2 g via INTRAVENOUS

## 2014-08-22 MED ORDER — PHENYLEPHRINE 8 MG IN D5W 100 ML (0.08MG/ML) PREMIX OPTIME
INJECTION | INTRAVENOUS | Status: DC | PRN
  Administered 2014-08-22: 60 ug/min via INTRAVENOUS

## 2014-08-22 MED ORDER — NALBUPHINE HCL 10 MG/ML IJ SOLN: 5.0000 mg | Freq: Once | INTRAMUSCULAR | Status: AC | PRN

## 2014-08-22 MED ORDER — LANOLIN HYDROUS EX OINT: 1.0000 "application " | TOPICAL_OINTMENT | CUTANEOUS | Status: DC | PRN

## 2014-08-22 MED ORDER — SIMETHICONE 80 MG PO CHEW
80.0000 mg | CHEWABLE_TABLET | ORAL | Status: DC
  Administered 2014-08-23 (×2): 80 mg via ORAL
  Filled 2014-08-22 (×2): qty 1

## 2014-08-22 MED ORDER — ONDANSETRON HCL 4 MG/2ML IJ SOLN
INTRAMUSCULAR | Status: DC | PRN
  Administered 2014-08-22: 4 mg via INTRAVENOUS

## 2014-08-22 MED ORDER — OXYTOCIN 40 UNITS IN LACTATED RINGERS INFUSION - SIMPLE MED: 62.5000 mL/h | INTRAVENOUS | Status: AC

## 2014-08-22 MED ORDER — OXYCODONE-ACETAMINOPHEN 5-325 MG PO TABS: 2.0000 | ORAL_TABLET | ORAL | Status: DC | PRN

## 2014-08-22 MED ORDER — OXYCODONE-ACETAMINOPHEN 5-325 MG PO TABS
1.0000 | ORAL_TABLET | ORAL | Status: DC | PRN
Start: 2014-08-22 — End: 2014-08-24
  Administered 2014-08-23 – 2014-08-24 (×5): 1 via ORAL
  Filled 2014-08-22 (×5): qty 1

## 2014-08-22 MED ORDER — FENTANYL CITRATE (PF) 100 MCG/2ML IJ SOLN: 25.0000 ug | INTRAMUSCULAR | Status: DC | PRN

## 2014-08-22 MED ORDER — ONDANSETRON HCL 4 MG/2ML IJ SOLN
INTRAMUSCULAR | Status: AC
  Filled 2014-08-22: qty 2

## 2014-08-22 MED ORDER — MORPHINE SULFATE 0.5 MG/ML IJ SOLN
INTRAMUSCULAR | Status: AC
  Filled 2014-08-22: qty 10

## 2014-08-22 MED ORDER — IBUPROFEN 600 MG PO TABS
600.0000 mg | ORAL_TABLET | Freq: Four times a day (QID) | ORAL | Status: DC
  Administered 2014-08-22 – 2014-08-24 (×7): 600 mg via ORAL
  Filled 2014-08-22 (×7): qty 1

## 2014-08-22 MED ORDER — PHENYLEPHRINE 8 MG IN D5W 100 ML (0.08MG/ML) PREMIX OPTIME
INJECTION | INTRAVENOUS | Status: AC
  Filled 2014-08-22: qty 100

## 2014-08-22 MED ORDER — SENNOSIDES-DOCUSATE SODIUM 8.6-50 MG PO TABS
2.0000 | ORAL_TABLET | ORAL | Status: DC
  Administered 2014-08-23 (×2): 2 via ORAL
  Filled 2014-08-22 (×2): qty 2

## 2014-08-22 MED ORDER — ACETAMINOPHEN 325 MG PO TABS: 650.0000 mg | ORAL_TABLET | ORAL | Status: DC | PRN

## 2014-08-22 MED ORDER — NALBUPHINE HCL 10 MG/ML IJ SOLN: 5.0000 mg | INTRAMUSCULAR | Status: DC | PRN

## 2014-08-22 MED ORDER — LACTATED RINGERS IV SOLN
INTRAVENOUS | Status: DC
  Administered 2014-08-22: 20:00:00 via INTRAVENOUS

## 2014-08-22 MED ORDER — OXYTOCIN 10 UNIT/ML IJ SOLN
40.0000 [IU] | INTRAVENOUS | Status: DC | PRN
  Administered 2014-08-22: 40 [IU] via INTRAVENOUS

## 2014-08-22 MED ORDER — NALOXONE HCL 0.4 MG/ML IJ SOLN: 0.4000 mg | INTRAMUSCULAR | Status: DC | PRN

## 2014-08-22 MED ORDER — DIPHENHYDRAMINE HCL 25 MG PO CAPS: 25.0000 mg | ORAL_CAPSULE | Freq: Four times a day (QID) | ORAL | Status: DC | PRN

## 2014-08-22 MED ORDER — DIPHENHYDRAMINE HCL 25 MG PO CAPS
25.0000 mg | ORAL_CAPSULE | ORAL | Status: DC | PRN
  Filled 2014-08-22: qty 1

## 2014-08-22 MED ORDER — SCOPOLAMINE 1 MG/3DAYS TD PT72
MEDICATED_PATCH | TRANSDERMAL | Status: AC
  Administered 2014-08-22: 1.5 mg via TRANSDERMAL
  Filled 2014-08-22: qty 1

## 2014-08-22 MED ORDER — BUPIVACAINE HCL (PF) 0.25 % IJ SOLN
INTRAMUSCULAR | Status: AC
  Filled 2014-08-22: qty 30

## 2014-08-22 MED ORDER — ZOLPIDEM TARTRATE 5 MG PO TABS: 5.0000 mg | ORAL_TABLET | Freq: Every evening | ORAL | Status: DC | PRN

## 2014-08-22 MED ORDER — SCOPOLAMINE 1 MG/3DAYS TD PT72: 1.0000 | MEDICATED_PATCH | Freq: Once | TRANSDERMAL | Status: DC

## 2014-08-22 MED ORDER — SIMETHICONE 80 MG PO CHEW: 80.0000 mg | CHEWABLE_TABLET | ORAL | Status: DC | PRN

## 2014-08-22 MED ORDER — FENTANYL CITRATE (PF) 100 MCG/2ML IJ SOLN
INTRAMUSCULAR | Status: AC
  Filled 2014-08-22: qty 2

## 2014-08-22 MED ORDER — KETOROLAC TROMETHAMINE 30 MG/ML IJ SOLN: 30.0000 mg | Freq: Four times a day (QID) | INTRAMUSCULAR | Status: AC | PRN

## 2014-08-22 MED ORDER — IBUPROFEN 600 MG PO TABS: 600.0000 mg | ORAL_TABLET | Freq: Four times a day (QID) | ORAL | Status: DC | PRN

## 2014-08-22 MED ORDER — PRENATAL MULTIVITAMIN CH
1.0000 | ORAL_TABLET | Freq: Every day | ORAL | Status: DC
  Administered 2014-08-23: 1 via ORAL
  Filled 2014-08-22: qty 1

## 2014-08-22 MED ORDER — ONDANSETRON HCL 4 MG/2ML IJ SOLN: 4.0000 mg | Freq: Three times a day (TID) | INTRAMUSCULAR | Status: DC | PRN

## 2014-08-22 MED ORDER — LACTATED RINGERS IV SOLN
INTRAVENOUS | Status: DC
  Administered 2014-08-22: 07:00:00 via INTRAVENOUS

## 2014-08-22 MED ORDER — BUPIVACAINE IN DEXTROSE 0.75-8.25 % IT SOLN
INTRATHECAL | Status: DC | PRN
  Administered 2014-08-22: .6 mL via INTRATHECAL

## 2014-08-22 MED ORDER — FENTANYL CITRATE (PF) 100 MCG/2ML IJ SOLN
INTRAMUSCULAR | Status: DC | PRN
  Administered 2014-08-22: 25 ug via INTRATHECAL

## 2014-08-22 MED ORDER — DIBUCAINE 1 % RE OINT: 1.0000 "application " | TOPICAL_OINTMENT | RECTAL | Status: DC | PRN

## 2014-08-22 MED ORDER — WITCH HAZEL-GLYCERIN EX PADS: 1.0000 "application " | MEDICATED_PAD | CUTANEOUS | Status: DC | PRN

## 2014-08-22 MED ORDER — KETOROLAC TROMETHAMINE 30 MG/ML IJ SOLN
INTRAMUSCULAR | Status: AC
  Filled 2014-08-22: qty 1

## 2014-08-22 MED ORDER — DIPHENHYDRAMINE HCL 50 MG/ML IJ SOLN: 12.5000 mg | INTRAMUSCULAR | Status: DC | PRN

## 2014-08-22 MED ORDER — MORPHINE SULFATE (PF) 0.5 MG/ML IJ SOLN
INTRAMUSCULAR | Status: DC | PRN
  Administered 2014-08-22: .15 mg via INTRATHECAL

## 2014-08-22 MED ORDER — LACTATED RINGERS IV SOLN
INTRAVENOUS | Status: DC | PRN
  Administered 2014-08-22: 08:00:00 via INTRAVENOUS

## 2014-08-22 MED ORDER — SCOPOLAMINE 1 MG/3DAYS TD PT72
1.0000 | MEDICATED_PATCH | Freq: Once | TRANSDERMAL | Status: DC
  Administered 2014-08-22: 1.5 mg via TRANSDERMAL

## 2014-08-22 MED ORDER — SIMETHICONE 80 MG PO CHEW
80.0000 mg | CHEWABLE_TABLET | Freq: Three times a day (TID) | ORAL | Status: DC
  Administered 2014-08-22 – 2014-08-23 (×3): 80 mg via ORAL
  Filled 2014-08-22 (×3): qty 1

## 2014-08-22 MED ORDER — MEPERIDINE HCL 25 MG/ML IJ SOLN: 6.2500 mg | INTRAMUSCULAR | Status: DC | PRN

## 2014-08-22 MED ORDER — MENTHOL 3 MG MT LOZG: 1.0000 | LOZENGE | OROMUCOSAL | Status: DC | PRN

## 2014-08-22 MED ORDER — BUPIVACAINE HCL 0.25 % IJ SOLN
INTRAMUSCULAR | Status: DC | PRN
  Administered 2014-08-22: 30 mL

## 2014-08-22 MED ORDER — LACTATED RINGERS IV SOLN
Freq: Once | INTRAVENOUS | Status: AC
  Administered 2014-08-22: 06:00:00 via INTRAVENOUS

## 2014-08-22 MED ORDER — OXYTOCIN 10 UNIT/ML IJ SOLN
INTRAMUSCULAR | Status: AC
  Filled 2014-08-22: qty 4

## 2014-08-22 SURGICAL SUPPLY — 41 items
APL SKNCLS STERI-STRIP NONHPOA (GAUZE/BANDAGES/DRESSINGS) ×1
BENZOIN TINCTURE PRP APPL 2/3 (GAUZE/BANDAGES/DRESSINGS) ×2 IMPLANT
CLAMP CORD UMBIL (MISCELLANEOUS) IMPLANT
CLIP FILSHIE TUBAL LIGA STRL (Clip) ×2 IMPLANT
CLOTH BEACON ORANGE TIMEOUT ST (SAFETY) ×2 IMPLANT
DRAPE SHEET LG 3/4 BI-LAMINATE (DRAPES) IMPLANT
DRSG OPSITE POSTOP 4X10 (GAUZE/BANDAGES/DRESSINGS) ×2 IMPLANT
DURAPREP 26ML APPLICATOR (WOUND CARE) ×2 IMPLANT
ELECT REM PT RETURN 9FT ADLT (ELECTROSURGICAL) ×2
ELECTRODE REM PT RTRN 9FT ADLT (ELECTROSURGICAL) ×1 IMPLANT
EXTRACTOR VACUUM BELL STYLE (SUCTIONS) IMPLANT
GLOVE BIO SURGEON STRL SZ 6.5 (GLOVE) ×2 IMPLANT
GLOVE BIOGEL PI IND STRL 7.0 (GLOVE) ×1 IMPLANT
GLOVE BIOGEL PI INDICATOR 7.0 (GLOVE) ×1
GOWN STRL REUS W/TWL LRG LVL3 (GOWN DISPOSABLE) ×6 IMPLANT
KIT ABG SYR 3ML LUER SLIP (SYRINGE) IMPLANT
NDL HYPO 25X5/8 SAFETYGLIDE (NEEDLE) IMPLANT
NEEDLE HYPO 22GX1.5 SAFETY (NEEDLE) ×2 IMPLANT
NEEDLE HYPO 25X5/8 SAFETYGLIDE (NEEDLE) IMPLANT
NS IRRIG 1000ML POUR BTL (IV SOLUTION) ×2 IMPLANT
PACK C SECTION WH (CUSTOM PROCEDURE TRAY) ×2 IMPLANT
PAD OB MATERNITY 4.3X12.25 (PERSONAL CARE ITEMS) ×2 IMPLANT
RETAINER VISCERAL (MISCELLANEOUS) ×1 IMPLANT
RTRCTR C-SECT PINK 25CM LRG (MISCELLANEOUS) ×2 IMPLANT
SPONGE LAP 18X18 X RAY DECT (DISPOSABLE) ×2 IMPLANT
STAPLER VISISTAT 35W (STAPLE) IMPLANT
STRIP CLOSURE SKIN 1/2X4 (GAUZE/BANDAGES/DRESSINGS) ×2 IMPLANT
SUT CHROMIC 0 CT 1 (SUTURE) ×1 IMPLANT
SUT CHROMIC 0 CTX 36 (SUTURE) ×1 IMPLANT
SUT CHROMIC 2 0 CT 1 (SUTURE) ×4 IMPLANT
SUT MNCRL 0 VIOLET CTX 36 (SUTURE) ×2 IMPLANT
SUT MONOCRYL 0 CTX 36 (SUTURE) ×3
SUT PDS AB 0 CTX 36 PDP370T (SUTURE) IMPLANT
SUT PLAIN 2 0 (SUTURE) ×2
SUT PLAIN ABS 2-0 CT1 27XMFL (SUTURE) IMPLANT
SUT VIC AB 0 CTX 36 (SUTURE) ×4
SUT VIC AB 0 CTX36XBRD ANBCTRL (SUTURE) ×2 IMPLANT
SUT VIC AB 4-0 KS 27 (SUTURE) ×2 IMPLANT
SYR CONTROL 10ML LL (SYRINGE) ×2 IMPLANT
TOWEL OR 17X24 6PK STRL BLUE (TOWEL DISPOSABLE) ×2 IMPLANT
TRAY FOLEY CATH SILVER 14FR (SET/KITS/TRAYS/PACK) ×2 IMPLANT

## 2014-08-22 NOTE — H&P (Signed)
Paula Myers is a 29 y.o. female presenting for scheduled repeat c-section and BTL.  Maternal Medical History:  Reason for admission: Nausea. Scheduled repeat cesarean section  Contractions: Onset was less than 1 hour ago.   Frequency: rare.   Duration is approximately 45 seconds.   Perceived severity is mild.    Fetal activity: Perceived fetal activity is normal.   Last perceived fetal movement was within the past 12 hours.    Prenatal complications: no prenatal complications Prenatal Complications - Diabetes: none.    OB History    Gravida Para Term Preterm AB TAB SAB Ectopic Multiple Living   Past Medical History  Diagnosis Date  . Kidney stone   . Tachycardia   . Palpitation   . SVT (supraventricular tachycardia)   . H/O cervical polypectomy   . Hypertension 2005    while in labor  . Dysrhythmia     "skipping" HR at beginning of pregnancy  . Heartburn during pregnancy   . Anemia     prior pregnancies   Past Surgical History  Procedure Laterality Date  . Elbow surgery      Left  . Cesarean section     Family History: family history includes Heart disease in her maternal grandmother; Hypertension in her maternal grandmother. Social History:  reports that she has never smoked. She has never used smokeless tobacco. She reports that she does not drink alcohol or use illicit drugs.   Prenatal Transfer Tool  Maternal Diabetes: No Genetic Screening: Declined Maternal Ultrasounds/Referrals: Normal Fetal Ultrasounds or other Referrals:  Other: Fetus was measuring SGA not IUGR otherwise normal Maternal Substance Abuse:  No Significant Maternal Medications:  None Significant Maternal Lab Results:  Lab values include: Group B Strep negative Other Comments:  None  Review of Systems  Constitutional: Negative for fever and chills.  HENT: Negative for hearing loss.   Eyes: Negative for blurred vision and double vision.  Respiratory:  Negative for cough and hemoptysis.   Cardiovascular: Negative for chest pain and palpitations.  Gastrointestinal: Negative for heartburn and nausea.  Genitourinary: Negative for dysuria and urgency.  Musculoskeletal: Negative for myalgias and neck pain.  Skin: Negative for itching and rash.  Neurological: Negative for dizziness, tingling and headaches.  Endo/Heme/Allergies: Negative for environmental allergies. Does not bruise/bleed easily.  Psychiatric/Behavioral: Negative for depression.  All other systems reviewed and are negative.     Blood pressure 128/79, pulse 100, temperature 98.2 F (36.8 C), temperature source Oral, resp. rate 20, last menstrual period 11/21/2013, SpO2 100 %. Maternal Exam:  Uterine Assessment: Contraction strength is mild.  Contraction duration is 25 seconds. Contraction frequency is irregular.   Abdomen: Patient reports no abdominal tenderness. Surgical scars: low transverse.   Fundal height is 39 cm.   Estimated fetal weight is 2800 grams.   Fetal presentation: vertex  Introitus: Normal vulva. Normal vagina.  Ferning test: not done.  Nitrazine test: not done.     Fetal Exam Fetal Monitor Review: Baseline rate: 140.  Variability: moderate (6-25 bpm).   Pattern: accelerations present and no decelerations.    Fetal State Assessment: Category I - tracings are normal.     Physical Exam  Nursing note and vitals reviewed. Constitutional: She is oriented to person, place, and time. She appears well-developed and well-nourished.  HENT:  Head: Normocephalic.  Eyes: Pupils are equal, round, and reactive to light.  Neck: Normal range of motion.  Cardiovascular: Normal rate and regular rhythm.   Respiratory: Effort normal.  GI: Soft. Bowel sounds are normal.  Genitourinary: Vagina normal.  Musculoskeletal: Normal range of motion.  Neurological: She is alert and oriented to person, place, and time. She has normal reflexes.  Skin: Skin is warm.   Psychiatric: She has a normal mood and affect.    Prenatal labs: ABO, Rh: --/--/A POS (06/21 1040) Antibody: NEG (06/21 1040) Rubella: Immune (12/29 0000) RPR: Nonreactive (03/23 0000)  HBsAg: Negative (12/29 0000)  HIV: Non-reactive (12/29 0000)  GBS: Positive (06/02 0000)   Assessment/Plan: 29 yo G4P2 at 39 weeks 1 day for elective repeat c-section and BTL Ancef 2GM IV on call to OR    Deston Bilyeu STACIA 08/22/2014, 6:59 AM

## 2014-08-22 NOTE — Anesthesia Postprocedure Evaluation (Signed)
  Anesthesia Post-op Note  Patient: Paula Myers  Procedure(s) Performed: Procedure(s): CESAREAN SECTION WITH BILATERAL TUBAL LIGATION (Bilateral)  Patient Location: PACU  Anesthesia Type:Spinal  Level of Consciousness: awake, alert  and oriented  Airway and Oxygen Therapy: Patient Spontanous Breathing  Post-op Pain: none  Post-op Assessment: Post-op Vital signs reviewed, Patient's Cardiovascular Status Stable, Respiratory Function Stable, Patent Airway, No signs of Nausea or vomiting, Pain level controlled, No headache, No backache and Spinal receding   LLE Sensation: Tingling   RLE Sensation: Tingling      Post-op Vital Signs: Reviewed and stable  Last Vitals:  Filed Vitals:   08/22/14 0945  BP: 130/57  Pulse: 84  Temp:   Resp: 21    Complications: No apparent anesthesia complications

## 2014-08-22 NOTE — Consult Note (Signed)
Neonatology Note:   Attendance at C-section:    I was asked by Dr. Mora Appl to attend this repeat C/S at term. The mother is a G4P2A1 A pos, GBS pos with a history of SVT. ROM at delivery, fluid clear. Infant vigorous with good spontaneous cry and tone. Needed only minimal bulb suctioning. Ap 9/9. Lungs clear to ausc in DR. To CN to care of Pediatrician.   Doretha Sou, MD

## 2014-08-22 NOTE — Addendum Note (Signed)
Addendum  created 08/22/14 1541 by Armanda Heritage, CRNA   Modules edited: Notes Section   Notes Section:  File: [id number]

## 2014-08-22 NOTE — Transfer of Care (Signed)
Immediate Anesthesia Transfer of Care Note  Patient: Paula Myers  Procedure(s) Performed: Procedure(s): CESAREAN SECTION WITH BILATERAL TUBAL LIGATION (Bilateral)  Patient Location: PACU  Anesthesia Type:Spinal  Level of Consciousness: awake, alert  and oriented  Airway & Oxygen Therapy: Patient Spontanous Breathing  Post-op Assessment: Report given to RN and Post -op Vital signs reviewed and stable  Post vital signs: Reviewed and stable  Last Vitals:  Filed Vitals:   08/22/14 0556  BP: 128/79  Pulse: 100  Temp: 36.8 C  Resp: 20    Complications: No apparent anesthesia complications

## 2014-08-22 NOTE — Anesthesia Procedure Notes (Signed)
Spinal Patient location during procedure: OR Start time: 08/22/2014 7:25 AM Staffing Anesthesiologist: Mal Amabile Performed by: anesthesiologist  Preanesthetic Checklist Completed: patient identified, site marked, surgical consent, pre-op evaluation, timeout performed, IV checked, risks and benefits discussed and monitors and equipment checked Spinal Block Patient position: sitting Prep: site prepped and draped and DuraPrep Patient monitoring: heart rate, cardiac monitor, continuous pulse ox and blood pressure Approach: midline Location: L4-5 Injection technique: single-shot Needle Needle type: Sprotte  Needle gauge: 24 G Needle length: 9 cm Needle insertion depth: 7 cm Assessment Sensory level: T4 Additional Notes Patient tolerated procedure well. Adequate sensory level.

## 2014-08-22 NOTE — Anesthesia Postprocedure Evaluation (Signed)
  Anesthesia Post-op Note  Patient: Paula Myers  Procedure(s) Performed: Procedure(s): CESAREAN SECTION WITH BILATERAL TUBAL LIGATION (Bilateral)  Patient Location:   Anesthesia Type:Spinal  Level of Consciousness: awake, alert  and oriented  Airway and Oxygen Therapy: Patient Spontanous Breathing  Post-op Pain: mild  Post-op Assessment: Post-op Vital signs reviewed   LLE Sensation: Full sensation   RLE Sensation: Full sensation      Post-op Vital Signs: Reviewed and stable  Last Vitals:  Filed Vitals:   08/22/14 1347  BP: 133/72  Pulse: 71  Temp:   Resp: 20    Complications: No apparent anesthesia complications

## 2014-08-22 NOTE — OR Nursing (Signed)
Charting under Dawn [place] id number, trying to change my password. Margarita Mail rn

## 2014-08-22 NOTE — Op Note (Signed)
Cesarean Section Procedure Note  Indications: previous uterine incision kerr x2   Pre-operative Diagnosis: 39 week 1 day pregnancy, prior LTCS x 2 and desired permanent sterilization  Post-operative Diagnosis: same  Surgeon: Arnav Cregg STACIA   Assistants: CLARK, DYANNA  Anesthesia: Spinal anesthesia  ASA Class: 2  Procedure Details   The patient was counseled about the risks, benefits, complications of the cesarean section. The patient concurred with the proposed plan, giving informed consent.  The site of surgery properly noted/marked. The patient was taken to Operating Room # 9, identified as Paula Myers and the procedure verified as C-Section Delivery. A Time Out was held and the above information confirmed.  After epidural was found to adequate , the patient was placed in the dorsal supine position with a leftward tilt, draped and prepped in the usual sterile manner. A Pfannenstiel incision was made and carried down through the subcutaneous tissue to the fascia.  The fascia was incised in the midline and the fascial incision was extended laterally with Mayo scissors. The superior aspect of the fascial incision was grasped with Coker clamps x2, tented up and the rectus muscles dissected off sharply with the scalpel. The rectus was then dissected off with blunt dissection and Mayo scissors inferiorly. The rectus muscles were separated in the midline. The abdominal peritoneum was identified, tented up, entered sharply, and the incision was extended superiorly and inferiorly with good visualization of the bladder. The Alexis retractor was then deployed. The scalpel was then used to make a low transverse incision on the uterus which was extended laterally with  blunt dissection. The fetal vertex was identified, the head was brought out from the pelvis.  The head was then delivered easily through the uterine incision followed by the body. The A live female infant was bulb suctioned on the  operative field cried vigorously, cord was clamped and cut and the infant was passed to the waiting neonatologist. Apgars 9/9. Placenta was then delivered spontaneously, intact and appear normal, the uterus was cleared of all clot and debris. The uterus was brought out of the abdomen. The uterine incision was repaired with #0 Monocryl in running locked fashion. A second imbricating suture was performed using the same suture. The incision was still oozing and several figure of 8 sutures were placed with 0-chromic. There was bleeding on the right side of the incision from open sinuses which was alleviated with a suture of 0-chromic. Hemostasis was achieved. Ovaries and tubes were inspected and normal.   The Filshie clip was then applied to the right and then to the left tube without  difficulty, observing that the clip surrounded the tube entirely and the anvil had good positioning within the mesosalpinx. There was noted to be no bleeding of the tube or the mesosalpinx. ?  The Alexis retractor was removed. The abdominal cavity was cleared of all clot and debris. The abdominal peritoneum was reapproximated with 2-0 chromic  in a running fashion, the rectus muscles was reapproximated with #2 chromic in interrupted fashion. The fascia was closed with 0 PDS in a running fashion from each end meeting in the middle.  The subcuticular layer was irrigated and all bleeders cauterized.  Exparel dilution 20 in 30 of 0.25% Marcaine was injected into the fascial and subcutaneous layer. The subcutaneous layer was re-approximated with 2-0 plain.   The skin was closed with 4-0 vicryl in a subcuticular fashion using a Keith needle. The incision was dressed with benzoine, steri strips and pressure dressing.   All sponge lap and needle counts were correct x3. Patient tolerated the procedure well and recovered in stable condition following the procedure.  Instrument, sponge, and needle counts were correct prior the abdominal  closure and at the conclusion of the case.   Findings: Live female infant, Apgars 9/9, clear amniotic fluid, normal appearing placenta, normal uterus, bilateral tubes and ovaries  Estimated Blood Loss: 700mL         Drains: Foley catheter         Specimens: Placenta to L&D         Implants:  Filshie Clips         Complications:  None; patient tolerated the procedure well.         Disposition: PACU - hemodynamically stable.   [name] STACIA 

## 2014-08-23 LAB — CBC
HCT: 30.3 % — ABNORMAL LOW (ref 36.0–46.0)
HEMOGLOBIN: 10.1 g/dL — AB (ref 12.0–15.0)
MCH: 27.7 pg (ref 26.0–34.0)
MCHC: 33.3 g/dL (ref 30.0–36.0)
MCV: 83.2 fL (ref 78.0–100.0)
PLATELETS: 213 10*3/uL (ref 150–400)
RBC: 3.64 MIL/uL — AB (ref 3.87–5.11)
RDW: 14.6 % (ref 11.5–15.5)
WBC: 10.6 10*3/uL — AB (ref 4.0–10.5)

## 2014-08-23 LAB — BIRTH TISSUE RECOVERY COLLECTION (PLACENTA DONATION)

## 2014-08-23 NOTE — Lactation Note (Addendum)
This note was copied from the chart of Paula Myers. Lactation Consultation Note  Assisted mom to achieve a deeper latch.  Baby was circumcised eariler today and is not very active at the breast.  Experienced BF mother reports that he had been BF better prior to circumcision.  Reviewed feeding cues and hand expression.  Denies any questions at this time.  Aware of support group and outpatient services.  Follow-up tomorrow.  Patient Name: Paula Myers [id number] Date: 08/23/2014 Reason for consult: Initial assessment   Maternal Data Has patient been taught Hand Expression?: Yes Does the patient have breastfeeding experience prior to this delivery?: Yes  Feeding Feeding Type: Breast Fed  LATCH Score/Interventions Latch: Grasps breast easily, tongue down, lips flanged, rhythmical sucking.  Audible Swallowing: A few with stimulation  Type of Nipple: Everted at rest and after stimulation  Comfort (Breast/Nipple): Filling, red/small blisters or bruises, mild/mod discomfort     Hold (Positioning): Assistance needed to correctly position infant at breast and maintain latch.  LATCH Score: 7  Lactation Tools Discussed/Used     Consult Status Consult Status: Follow-up Date: 08/24/14 Follow-up type: In-patient    Soyla Dryer 08/23/2014, 2:57 PM

## 2014-08-23 NOTE — Progress Notes (Signed)
Subjective: Postpartum Day 1: Cesarean Delivery Patient reports incisional pain, tolerating PO and no problems voiding.    Objective: Vital signs in last 24 hours: Temp:  [98 F (36.7 C)-98.6 F (37 C)] 98 F (36.7 C) (06/23 0545) Pulse Rate:  [70-85] 84 (06/23 0545) Resp:  [16-24] 18 (06/23 0545) BP: (98-140)/(56-84) 98/56 mmHg (06/23 0545) SpO2:  [98 %-99 %] 98 % (06/23 0545)  Physical Exam:  General: alert, cooperative and no distress Lochia: appropriate Uterine Fundus: firm Incision: healing well, no significant drainage, no dehiscence, no significant erythema DVT Evaluation: No evidence of DVT seen on physical exam. Negative Homan's sign. No cords or calf tenderness. No significant calf/ankle edema.   Recent Labs  08/21/14 1040  HGB 11.3*  HCT 33.5*    Assessment/Plan: Status post Cesarean section. Doing well postoperatively.  Baby to be circumcised.  Essie Hart STACIA 08/23/2014, 9:44 AM

## 2014-08-24 MED ORDER — OXYCODONE-ACETAMINOPHEN 5-325 MG PO TABS: 1.0000 | ORAL_TABLET | ORAL | Status: DC | PRN

## 2014-08-24 NOTE — Discharge Summary (Signed)
Obstetric Discharge Summary Reason for Admission: cesarean section Prenatal Procedures: ultrasound Intrapartum Procedures: cesarean: low cervical, transverse Postpartum Procedures: none Complications-Operative and Postpartum: none HEMOGLOBIN  Date Value Ref Range Status  08/23/2014 10.1* 12.0 - 15.0 g/dL Final   HCT  Date Value Ref Range Status  08/23/2014 30.3* 36.0 - 46.0 % Final    Physical Exam:  General: alert and cooperative Lochia: appropriate Uterine Fundus: firm Incision: healing well, no significant drainage DVT Evaluation: No evidence of DVT seen on physical exam.  Discharge Diagnoses: Term Pregnancy-delivered  Discharge Information: Date: 08/24/2014 Activity: pelvic rest Diet: routine Medications: PNV, Ibuprofen and Percocet Condition: stable Instructions: refer to practice specific booklet Discharge to: home Follow-up Information    Follow up with PINN, [name], MD In 2 weeks.   Specialty:  Obstetrics and Gynecology   Contact information:   [address] [place] [address] [phone]       Newborn Data: Live born female  Birth Weight: 7 lb 11.5 oz (3500 g) APGAR: 9, 9  Home with mother.  Paula Myers 08/24/2014, 10:08 AM

## 2014-08-25 ENCOUNTER — Encounter (HOSPITAL_COMMUNITY): Payer: Self-pay | Admitting: Obstetrics & Gynecology

## 2014-09-24 ENCOUNTER — Other Ambulatory Visit: Payer: Self-pay | Admitting: Obstetrics & Gynecology

## 2015-03-06 ENCOUNTER — Other Ambulatory Visit: Payer: Self-pay | Admitting: Obstetrics and Gynecology

## 2015-03-07 LAB — CYTOLOGY - PAP

## 2015-10-01 ENCOUNTER — Ambulatory Visit (INDEPENDENT_AMBULATORY_CARE_PROVIDER_SITE_OTHER): Payer: 59 | Admitting: Pediatrics

## 2015-10-01 ENCOUNTER — Encounter: Payer: Self-pay | Admitting: Pediatrics

## 2015-10-01 VITALS — BP 130/84 | HR 86 | Temp 97.2°F | Ht 65.0 in | Wt 263.2 lb

## 2015-10-01 DIAGNOSIS — N63 Unspecified lump in unspecified breast: Secondary | ICD-10-CM

## 2015-10-01 DIAGNOSIS — Z6841 Body Mass Index (BMI) 40.0 and over, adult: Secondary | ICD-10-CM | POA: Diagnosis not present

## 2015-10-01 DIAGNOSIS — Z638 Other specified problems related to primary support group: Secondary | ICD-10-CM

## 2015-10-01 DIAGNOSIS — F329 Major depressive disorder, single episode, unspecified: Secondary | ICD-10-CM

## 2015-10-01 DIAGNOSIS — F439 Reaction to severe stress, unspecified: Secondary | ICD-10-CM

## 2015-10-01 DIAGNOSIS — F32A Depression, unspecified: Secondary | ICD-10-CM

## 2015-10-01 MED ORDER — CITALOPRAM HYDROBROMIDE 20 MG PO TABS: 20.0000 mg | ORAL_TABLET | Freq: Every day | ORAL | 5 refills | Status: DC

## 2015-10-01 NOTE — Progress Notes (Signed)
    Subjective:    Patient ID: Paula Myers, female    DOB: 1985-09-21, 30 y.o.   MRN: [id number]  CC: New Patient (Initial Visit) (Left breast lump) .  HPI: Paula Myers is a 30 y.o. female presenting for New Patient (Initial Visit) (Left breast lump)  Noticed last week a bump on breast   Having some anxiety and depression Husband dx with Hodgkins Lymphoma 2 mo ago Getting chemo Pt says he is doing ok Kids 1, 30 (daughter), 12yo Was on something for depression 30 yo, not sure what it was, then on wellbutrin but that made her feel worse Has trouble getting to sleep, once asleep stays asleep Saw a psychiatrist a while ago Has good days and bad days No invincibility   Depression screen [facility] 2/9 10/01/2015  Decreased Interest 0  Down, Depressed, Hopeless 0  PHQ - 2 Score 0    Family History  Problem Relation Age of Onset  . Hypertension Maternal Grandmother   . Heart disease Maternal Grandmother    Doesn't know about dad's side of the family  Past Medical History:  Diagnosis Date  . Anemia    prior pregnancies  . Dysrhythmia    "skipping" HR at beginning of pregnancy  . H/O cervical polypectomy   . Heartburn during pregnancy   . Hypertension 2005   while in labor  . Kidney stone   . Palpitation   . SVT (supraventricular tachycardia) (HCC)   . Tachycardia      ROS: All systems neg other than what is in HPI    Objective:    BP 130/84 (BP Location: Right Arm, Patient Position: Sitting, Cuff Size: Large)   Pulse 86   Temp 97.2 F (36.2 C) (Oral)   Ht 5\' 5"  (1.651 m)   Wt 263 lb 3.2 oz (119.4 kg)   LMP 09/17/2015 (Approximate)   BMI 43.80 kg/m   Wt Readings from Last 3 Encounters:  10/01/15 263 lb 3.2 oz (119.4 kg)  08/23/14 254 lb (115.2 kg)  08/21/14 260 lb (117.9 kg)     Gen: NAD, alert, cooperative with exam, NCAT EYES: EOMI, no scleral injection or icterus ENT:  TMs pearly gray b/l, OP without erythema LYMPH: no cervical LAD CV: NRRR,  normal S1/S2, no murmur, distal pulses 2+ b/l Resp: CTABL, no wheezes, normal WOB Abd: +BS, soft, NTND. no guarding or organomegaly Ext: No edema, warm Neuro: Alert and oriented, strength equal b/l UE and LE, coordination grossly normal MSK: normal muscle bulk Breast: normal R breast exam, L breast with apprx 2cm by 0.5 cm lump L upper quadrant of breast felt by deep palpation. Normal skin over breast, no LAD.     Assessment & Plan:    Paula Myers was seen today for new patient (initial visit), breast mass, depression.   Diagnoses and all orders for this visit:  Depression inadequate controlled, start below. encourgaed counseling -     citalopram (CELEXA) 20 MG tablet; Take 1 tablet (20 mg total) by mouth daily.  BMI 43 Continue lifestyle changes  Breast lump -     MM Digital Diagnostic Unilat L; Future    Follow up plan: 8 weeks  Rex Kras, MD [place] [facility] 10/01/2015, 4:34 PM

## 2015-10-02 DIAGNOSIS — F32A Depression, unspecified: Secondary | ICD-10-CM | POA: Insufficient documentation

## 2015-10-02 DIAGNOSIS — F329 Major depressive disorder, single episode, unspecified: Secondary | ICD-10-CM | POA: Insufficient documentation

## 2015-10-02 DIAGNOSIS — F439 Reaction to severe stress, unspecified: Secondary | ICD-10-CM | POA: Insufficient documentation

## 2015-10-02 DIAGNOSIS — F331 Major depressive disorder, recurrent, moderate: Secondary | ICD-10-CM | POA: Insufficient documentation

## 2015-10-02 DIAGNOSIS — N63 Unspecified lump in unspecified breast: Secondary | ICD-10-CM | POA: Insufficient documentation

## 2015-10-02 DIAGNOSIS — Z6841 Body Mass Index (BMI) 40.0 and over, adult: Secondary | ICD-10-CM | POA: Insufficient documentation

## 2015-10-04 ENCOUNTER — Telehealth: Payer: Self-pay | Admitting: Pediatrics

## 2015-10-08 ENCOUNTER — Other Ambulatory Visit: Payer: Self-pay | Admitting: Pediatrics

## 2015-10-08 DIAGNOSIS — N63 Unspecified lump in unspecified breast: Secondary | ICD-10-CM

## 2015-10-11 ENCOUNTER — Telehealth: Payer: Self-pay | Admitting: Pediatrics

## 2015-10-11 NOTE — Telephone Encounter (Signed)
Pt aware of appointment date/time  [facility]

## 2015-10-22 ENCOUNTER — Ambulatory Visit (HOSPITAL_COMMUNITY)
Admission: RE | Admit: 2015-10-22 | Discharge: 2015-10-22 | Disposition: A | Discharge status: Home / Self Care | Payer: 59 | Source: Ambulatory Visit | Attending: Pediatrics | Admitting: Pediatrics

## 2015-10-22 DIAGNOSIS — N63 Unspecified lump in unspecified breast: Secondary | ICD-10-CM

## 2015-10-22 DIAGNOSIS — N6002 Solitary cyst of left breast: Secondary | ICD-10-CM | POA: Insufficient documentation

## 2015-10-23 NOTE — Telephone Encounter (Signed)
Imaging test were done 10-22-15.

## 2015-11-05 ENCOUNTER — Encounter: Payer: Self-pay | Admitting: Pediatrics

## 2015-11-05 ENCOUNTER — Other Ambulatory Visit: Payer: Self-pay | Admitting: Nurse Practitioner

## 2016-03-10 ENCOUNTER — Other Ambulatory Visit: Payer: Self-pay | Admitting: Obstetrics and Gynecology

## 2016-03-12 LAB — CYTOLOGY - PAP

## 2016-04-10 ENCOUNTER — Encounter: Payer: Self-pay | Admitting: Family Medicine

## 2016-04-10 ENCOUNTER — Ambulatory Visit (INDEPENDENT_AMBULATORY_CARE_PROVIDER_SITE_OTHER): Payer: 59 | Admitting: Family Medicine

## 2016-04-10 VITALS — BP 141/80 | HR 95 | Temp 98.1°F | Ht 65.0 in | Wt 259.0 lb

## 2016-04-10 DIAGNOSIS — R05 Cough: Secondary | ICD-10-CM | POA: Diagnosis not present

## 2016-04-10 DIAGNOSIS — R310 Gross hematuria: Secondary | ICD-10-CM | POA: Diagnosis not present

## 2016-04-10 DIAGNOSIS — R059 Cough, unspecified: Secondary | ICD-10-CM

## 2016-04-10 LAB — URINALYSIS
Bilirubin, UA: NEGATIVE
Glucose, UA: NEGATIVE
KETONES UA: NEGATIVE
LEUKOCYTES UA: NEGATIVE
Nitrite, UA: NEGATIVE
PH UA: 6 (ref 5.0–7.5)
PROTEIN UA: NEGATIVE
RBC, UA: NEGATIVE
Specific Gravity, UA: <1.005 — ABNORMAL LOW (ref 1.005–1.030)
Urobilinogen, Ur: 0.2 mg/dL (ref 0.2–1.0)

## 2016-04-10 MED ORDER — CLARITHROMYCIN ER 500 MG PO TB24: 1000.0000 mg | ORAL_TABLET | Freq: Every day | ORAL | 0 refills | Status: DC

## 2016-04-10 MED ORDER — BETAMETHASONE SOD PHOS & ACET 6 (3-3) MG/ML IJ SUSP
6.0000 mg | Freq: Once | INTRAMUSCULAR | Status: AC
  Administered 2016-04-10: 6 mg via INTRAMUSCULAR

## 2016-04-10 NOTE — Progress Notes (Signed)
Subjective:  Patient ID: Paula Myers, female    DOB: 1985/09/02  Age: 31 y.o. MRN: [id number]  CC: Cough (pt here today c/o cough and congestion)   HPI Paula Myers presents for The cough has been ongoing for about 3 months. It waxes and wanes. It is occasionally productive. Productivity can be mucoid. There is been no fever no chills no sweats. No body aches. She has not been short of breath. Congestion is in the nasal passages and cheeks. No earaches. No purulent rhinorrhea. No sore throat.  Patient also states that she's had some blood in her urine for a few days. It started off 3 days ago and lasted 2 days. Yesterday there was no sign of it and today her urine has been clear. She's had no dysuria. No flank pain. No abdominal pain.   History Paula Myers has a past medical history of Anemia; Dysrhythmia; H/O cervical polypectomy; Heartburn during pregnancy; Hypertension (2005); Kidney stone; Palpitation; SVT (supraventricular tachycardia) (HCC); and Tachycardia.   She has a past surgical history that includes Elbow surgery; Cesarean section; and Cesarean section with bilateral tubal ligation (Bilateral, 08/22/2014).   Her family history includes Heart disease in her maternal grandmother; Hypertension in her maternal grandmother.She reports that she has never smoked. She has never used smokeless tobacco. She reports that she does not drink alcohol or use drugs.    ROS Review of Systems  Constitutional: Negative for activity change, appetite change and fever.  HENT: Negative for congestion, rhinorrhea and sore throat.   Eyes: Negative for visual disturbance.  Respiratory: Negative for cough and shortness of breath.   Cardiovascular: Negative for chest pain and palpitations.  Gastrointestinal: Negative for abdominal pain, diarrhea and nausea.  Genitourinary: Negative for decreased urine volume, difficulty urinating, dysuria and urgency.  Musculoskeletal: Negative for  arthralgias and myalgias.    Objective:  BP (!) 141/80   Pulse 95   Temp 98.1 F (36.7 C) (Oral)   Ht 5\' 5"  (1.651 m)   Wt 259 lb (117.5 kg)   LMP 03/27/2016 (Approximate)   BMI 43.10 kg/m   BP Readings from Last 3 Encounters:  04/10/16 (!) 141/80  10/01/15 130/84  08/24/14 122/71    Wt Readings from Last 3 Encounters:  04/10/16 259 lb (117.5 kg)  10/01/15 263 lb 3.2 oz (119.4 kg)  08/23/14 254 lb (115.2 kg)     Physical Exam  Constitutional: She is oriented to person, place, and time. She appears well-developed and well-nourished. No distress.  HENT:  Head: Normocephalic and atraumatic.  Eyes: Conjunctivae are normal. Pupils are equal, round, and reactive to light.  Neck: Normal range of motion. Neck supple. No thyromegaly present.  Cardiovascular: Normal rate, regular rhythm and normal heart sounds.   No murmur heard. Pulmonary/Chest: Effort normal and breath sounds normal. No respiratory distress. She has no wheezes. She has no rales.  Abdominal: Soft. Bowel sounds are normal. She exhibits no distension. There is no tenderness.  Musculoskeletal: Normal range of motion.  Lymphadenopathy:    She has no cervical adenopathy.  Neurological: She is alert and oriented to person, place, and time.  Skin: Skin is warm and dry.  Psychiatric: She has a normal mood and affect. Her behavior is normal. Judgment and thought content normal.      Assessment & Plan:   Paula Myers was seen today for cough.  Diagnoses and all orders for this visit:  Gross hematuria -     Urinalysis, Complete  Cough -  betamethasone acetate-betamethasone sodium phosphate (CELESTONE) injection 6 mg; Inject 1 mL (6 mg total) into the muscle once.  Other orders -     clarithromycin (BIAXIN XL) 500 MG 24 hr tablet; Take 2 tablets (1,000 mg total) by mouth daily.    Urinalysis shows no sign of blood. Advised to monitor for return of symptoms and follow-up as needed.  I have discontinued Ms.  Myers's citalopram. I am also having her start on clarithromycin. Additionally, I am having her maintain her acetaminophen. We will continue to administer betamethasone acetate-betamethasone sodium phosphate.  Allergies as of 04/10/2016      Reactions   Levaquin [levofloxacin In D5w] Anaphylaxis   Pertussis Vaccines Swelling   T-Dap injection caused localized swelling of arm. Has had Tetanus before without problems.      Medication List       Accurate as of 04/10/16  4:30 PM. Always use your most recent med list.          acetaminophen 325 MG tablet Commonly known as:  TYLENOL Take by mouth every 6 (six) hours as needed for mild pain, moderate pain or headache.   clarithromycin 500 MG 24 hr tablet Commonly known as:  BIAXIN XL Take 2 tablets (1,000 mg total) by mouth daily.        Follow-up: Return if symptoms worsen or fail to improve.  Mechele Claude, M.D.

## 2016-04-10 NOTE — Addendum Note (Signed)
Addended by: Margurite AuerbachOMPTON, KARLA G on: 04/10/2016 04:55 PM   Modules accepted: Orders

## 2016-04-11 LAB — URINALYSIS, MICROSCOPIC ONLY: Casts: NONE SEEN /lpf

## 2016-04-15 ENCOUNTER — Encounter: Payer: Self-pay | Admitting: Family Medicine

## 2016-04-15 ENCOUNTER — Other Ambulatory Visit: Payer: Self-pay | Admitting: Family Medicine

## 2016-04-15 MED ORDER — AMOXICILLIN-POT CLAVULANATE 875-125 MG PO TABS: 1.0000 | ORAL_TABLET | Freq: Two times a day (BID) | ORAL | 0 refills | Status: DC

## 2016-07-31 ENCOUNTER — Ambulatory Visit (INDEPENDENT_AMBULATORY_CARE_PROVIDER_SITE_OTHER): Payer: 59 | Admitting: Pediatrics

## 2016-07-31 ENCOUNTER — Encounter: Payer: Self-pay | Admitting: Pediatrics

## 2016-07-31 VITALS — BP 123/80 | HR 89 | Temp 97.3°F | Ht 65.0 in | Wt 254.4 lb

## 2016-07-31 DIAGNOSIS — D509 Iron deficiency anemia, unspecified: Secondary | ICD-10-CM | POA: Diagnosis not present

## 2016-07-31 DIAGNOSIS — F329 Major depressive disorder, single episode, unspecified: Secondary | ICD-10-CM | POA: Diagnosis not present

## 2016-07-31 DIAGNOSIS — N760 Acute vaginitis: Secondary | ICD-10-CM

## 2016-07-31 DIAGNOSIS — O210 Mild hyperemesis gravidarum: Secondary | ICD-10-CM | POA: Insufficient documentation

## 2016-07-31 DIAGNOSIS — O923 Agalactia: Secondary | ICD-10-CM | POA: Insufficient documentation

## 2016-07-31 DIAGNOSIS — F32A Depression, unspecified: Secondary | ICD-10-CM

## 2016-07-31 DIAGNOSIS — B9689 Other specified bacterial agents as the cause of diseases classified elsewhere: Secondary | ICD-10-CM | POA: Insufficient documentation

## 2016-07-31 DIAGNOSIS — R87810 Cervical high risk human papillomavirus (HPV) DNA test positive: Secondary | ICD-10-CM | POA: Insufficient documentation

## 2016-07-31 MED ORDER — VENLAFAXINE HCL ER 37.5 MG PO TB24: 37.5000 mg | ORAL_TABLET | Freq: Every day | ORAL | 2 refills | Status: DC

## 2016-07-31 NOTE — Patient Instructions (Signed)
[email or web address]  Your provider wants you to schedule an appointment with a Psychologist/Psychiatrist. The following list of offices requires the patient to call and make their own appointment, as there is information they need that only you can provide. Please feel free to choose form the following providers:  [facility]   [phone] Crisis Recovery in [place] [phone]  [facility]  [phone]   [address] [place], [place]  (Scheduled through [facility]) Must call and do an interview for appointment. Sees Children / Accepts Medicaid  [facility]    [phone]  [address], [address]    [place], [place]       [facility]  [phone] [address] [place], [place]  Evaluates for Autism but does not treat it Sees Children / Accepts Medicaid  [facility]    [phone] [address], [address]   [place], [place] Medication management, substance abuse, bipolar, grief, family, marriage, OCD, anxiety, PTSD Sees children / Accepts Medicaid  [facility]    [phone] [address] [place], [place] Sees children / Accepts Medicaid  [facility]  [phone] [address] [place], [place]   Dr Akinlayo     [phone] [address] [place], [place]  Sees ADD & ADHD for treatment Accepts Medicaid  [facility]  [phone] [place], [place] Evaluates for Autism Accepts Medicaid  [facility]  [phone] [address]  [place], [place]  Does Adult ADD evaluations Does not accept Medicaid  [facility]   [phone] [address]   [place], [place] Uses animal therapy  Sees children as young as 3 years old Accepts Medicaid  [facility]     [phone]    [address]  [place], [place] [address] Sees children Accepts Medicaid   

## 2016-07-31 NOTE — Progress Notes (Signed)
  Subjective:   Patient ID: Paula Myers, female    DOB: [birth year], 31 y.o.   MRN: [id number] CC: Depression and Anxiety  HPI: Paula Myers is a 31 y.o. female presenting for Depression and Anxiety  Thinks for past two months depression has been worse Has been tried on wellbutrin, thinks her depression got worse Was started on celexa last summer, caused sexual side effects so she stopped it, not able to come back in with high deductible plan last year, new insurance now  Feels safe at home Nerves stay shot, feels snappy all the time Mom and husband have been noticing snapping  Husband finished with chemo treatments for hodgkins, in surveillance  Still with breast lump, had diagnostic u/s, was benign rec to f/u at age 31yo  BMI elevated: was going to zumba class, has no interest in it now  Relevant past medical, surgical, family and social history reviewed. Allergies and medications reviewed and updated. History  Smoking Status  . Never Smoker  Smokeless Tobacco  . Never Used   ROS: Per HPI   Objective:    BP 123/80   Pulse 89   Temp 97.3 F (36.3 C) (Oral)   Ht 5\' 5"  (1.651 m)   Wt 254 lb 6.4 oz (115.4 kg)   BMI 42.33 kg/m   Wt Readings from Last 3 Encounters:  07/31/16 254 lb 6.4 oz (115.4 kg)  04/10/16 259 lb (117.5 kg)  10/01/15 263 lb 3.2 oz (119.4 kg)    Gen: NAD, alert, cooperative with exam, NCAT EYES: EOMI, no conjunctival injection, or no icterus CV: NRRR, normal S1/S2, no murmur, distal pulses 2+ b/l Resp: CTABL, no wheezes, normal WOB Abd: soft, NTND Ext: No edema, warm Neuro: Alert and oriented Psych: normal affect, no thoughts of self harm  Assessment & Plan:  Paula Myers was seen today for depression and anxiety.  Diagnoses and all orders for this visit:  Depression, unspecified depression type Ongoing symptoms, not on medication now No thgouths of self harm, feels safe at home Gave list of counselors, pt to call Start below -      Venlafaxine HCl 37.5 MG TB24; Take 1 tablet (37.5 mg total) by mouth daily.  Iron deficiency anemia, unspecified iron deficiency anemia type Following with OB, heavy periods, on Fe replacement, due for repeat labs per pt -     CBC with Differential -     Ferritin   Follow up plan: Return in about 4 weeks (around 08/28/2016). Rex Krasarol Deondray Ospina, MD [facility]

## 2016-08-01 LAB — CBC WITH DIFFERENTIAL/PLATELET
BASOS ABS: 0.1 10*3/uL (ref 0.0–0.2)
Basos: 1 %
EOS (ABSOLUTE): 0.2 10*3/uL (ref 0.0–0.4)
Eos: 2 %
Hematocrit: 34.6 % (ref 34.0–46.6)
Hemoglobin: 10.5 g/dL — ABNORMAL LOW (ref 11.1–15.9)
IMMATURE GRANS (ABS): 0 10*3/uL (ref 0.0–0.1)
Immature Granulocytes: 0 %
LYMPHS: 27 %
Lymphocytes Absolute: 2.4 10*3/uL (ref 0.7–3.1)
MCH: 21.4 pg — AB (ref 26.6–33.0)
MCHC: 30.3 g/dL — ABNORMAL LOW (ref 31.5–35.7)
MCV: 71 fL — AB (ref 79–97)
MONOS ABS: 0.5 10*3/uL (ref 0.1–0.9)
Monocytes: 6 %
NEUTROS ABS: 5.6 10*3/uL (ref 1.4–7.0)
NEUTROS PCT: 64 %
PLATELETS: 398 10*3/uL — AB (ref 150–379)
RBC: 4.91 x10E6/uL (ref 3.77–5.28)
RDW: 17.7 % — AB (ref 12.3–15.4)
WBC: 8.7 10*3/uL (ref 3.4–10.8)

## 2016-08-01 LAB — FERRITIN: FERRITIN: 6 ng/mL — AB (ref 15–150)

## 2016-08-03 ENCOUNTER — Telehealth: Payer: Self-pay | Admitting: Pediatrics

## 2016-08-03 NOTE — Telephone Encounter (Signed)
Per pt's husband, pt is very dizzy and nervous after starting Venlafaxine last night She can not tolerate this med Pt states she has taken Lamictal and Lorazepam that has worked in the past Please advise

## 2016-08-04 ENCOUNTER — Encounter: Payer: Self-pay | Admitting: Pediatrics

## 2016-08-04 MED ORDER — LAMOTRIGINE 25 MG PO TABS: ORAL_TABLET | ORAL | 0 refills | Status: DC

## 2016-08-04 NOTE — Telephone Encounter (Signed)
Pt notified of recommendation Pt had tubal ligation She will d/c Venlafaxine and start Lamictal She will call today to schedule with counselor

## 2016-08-04 NOTE — Telephone Encounter (Signed)
Stop venlafaxine, I sent in lamictal 25mg  tabs. Have to increase slowly. Take one a day for 2 weeks, then take 2 in the morning for two weeks. Already has f/u with me 6/29. Needs to stay on effective birth control while on this medication. If any problems come back sooner. Has she called for counseling appt yet?

## 2016-08-05 NOTE — Telephone Encounter (Signed)
Having heavy periods, first three days changing super size pads every hour. Last seen by gyn 03/03/2016, rec start iron pills, has been taking regularly past few months. Still with decrease Hg, low ferritin and MCV. Pt with tubal ligation for BC. Wants to avoid hormone treatment if possible as has made her mood worse in the past. Pt will call to set up appt with Gyn.

## 2016-08-28 ENCOUNTER — Ambulatory Visit (INDEPENDENT_AMBULATORY_CARE_PROVIDER_SITE_OTHER): Payer: 59 | Admitting: Pediatrics

## 2016-08-28 ENCOUNTER — Encounter: Payer: Self-pay | Admitting: Pediatrics

## 2016-08-28 VITALS — BP 125/81 | HR 92 | Temp 97.4°F | Ht 65.0 in | Wt 253.8 lb

## 2016-08-28 DIAGNOSIS — N920 Excessive and frequent menstruation with regular cycle: Secondary | ICD-10-CM

## 2016-08-28 DIAGNOSIS — F419 Anxiety disorder, unspecified: Secondary | ICD-10-CM

## 2016-08-28 DIAGNOSIS — F319 Bipolar disorder, unspecified: Secondary | ICD-10-CM

## 2016-08-28 MED ORDER — BUSPIRONE HCL 5 MG PO TABS: 5.0000 mg | ORAL_TABLET | Freq: Three times a day (TID) | ORAL | 2 refills | Status: DC

## 2016-08-28 MED ORDER — LAMOTRIGINE 100 MG PO TABS: ORAL_TABLET | ORAL | 2 refills | Status: DC

## 2016-08-28 NOTE — Progress Notes (Signed)
Subjective:   Patient ID: Paula Myers, female    DOB: 1985/03/26, 31 y.o.   MRN: [id number] CC: Follow-up (4 week, Depression)  HPI: Paula Myers is a 31 y.o. female presenting for Follow-up (4 week, Depression)  Pt saw Gyn yesterday for menorrhagia Has had tubes tied for birth contorl in the past Pt interested in ablation to help with heavy periods Thinks that hormonal BC in the past has altered mood  Mood: has not noticed big different on the lamicatal Has been on it for apprx 3 weeks Nothing is getting worse, doesn't think things have gotten much better  a lot of stress at home Husband had recheck with oncology, had elevated counts and needs scan Kids have been sick at home  Diagnosed with bipolar d/o by psychiatry, antidepressants didn't work but lamicatal did in the past  Has been going to bed at 9pm, wakes up at 550a Feels tired all the time Started counseling with someone at church, has been helpful No thoughts of self harm Sometimes wishes to be somewhere else  Regularly stooling, no blood in stool, used to be dark when she first started iron, lighter now Has loose stools several times a day  [facility] 2[facility] 08/28/2016 07/31/2016 04/10/2016 10/01/2015  Decreased Interest 2 3 2  0  Down, Depressed, Hopeless - 2 1 0  PHQ - 2 Score 2 5 3  0  Altered sleeping 2 3 2  -  Tired, decreased energy 2 3 3  -  Change in appetite 2 2 1  -  Feeling bad or failure about yourself  2 2 0 -  Trouble concentrating 2 2 1  -  Moving slowly or fidgety/restless 2 2 0 -  Suicidal thoughts 1 1 0 -  PHQ-9 Score 15 20 10  -  Difficult doing work/chores Somewhat difficult Very difficult - -    Relevant past medical, surgical, family and social history reviewed. Allergies and medications reviewed and updated. History  Smoking Status  . Never Smoker  Smokeless Tobacco  . Never Used   ROS: Per HPI   Objective:    BP 125/81   Pulse 92   Temp 97.4 F (36.3 C) (Oral)    Ht 5\' 5"  (1.651 m)   Wt 253 lb 12.8 oz (115.1 kg)   BMI 42.23 kg/m   Wt Readings from Last 3 Encounters:  08/28/16 253 lb 12.8 oz (115.1 kg)  07/31/16 254 lb 6.4 oz (115.4 kg)  04/10/16 259 lb (117.5 kg)    Gen: NAD, alert, cooperative with exam, NCAT EYES: EOMI, no conjunctival injection, or no icterus ENT:  OP without erythema LYMPH: no cervical LAD CV: NRRR, normal S1/S2, no murmur Resp: CTABL, no wheezes, normal WOB Abd: +BS, soft, mildly tender LUQ with palpation, ND. no guarding or organomegaly Ext: No edema, warm Neuro: Alert and oriented, strength equal b/l UE and LE, coordination grossly normal MSK: normal muscle bulk Psych: full affect, tearful at times  Assessment & Plan:  Paula Myers was seen today for follow-up depression  Diagnoses and all orders for this visit:  Bipolar 1 disorder (HCC) Increase to 100mg  daily Cont counseling Feels safe at home, says comfortable rtc if any worsening -     lamoTRIgine (LAMICTAL) 100 MG tablet; Take 100mg  daily  Anxiety Trial of below, condiser ssri once on lamictal if still symptomatic -     busPIRone (BUSPAR) 5 MG tablet; Take 1 tablet (5 mg total) by mouth 3 (three) times daily.  Menorrhagia with regular cycle Has  been seen by gyn, pt wants to try for ablation Cont Fe  Follow up plan: Return in about 8 weeks (around 10/23/2016) for med follow up. Rex Kras, MD [facility]

## 2016-08-28 NOTE — Patient Instructions (Signed)
Send me a message about how your symptoms are doing in a couple of weeks

## 2016-09-30 ENCOUNTER — Encounter: Payer: Self-pay | Admitting: Pediatrics

## 2016-09-30 DIAGNOSIS — F419 Anxiety disorder, unspecified: Secondary | ICD-10-CM

## 2016-09-30 MED ORDER — BUSPIRONE HCL 10 MG PO TABS: 10.0000 mg | ORAL_TABLET | Freq: Two times a day (BID) | ORAL | 1 refills | Status: DC

## 2016-10-02 ENCOUNTER — Encounter: Payer: Self-pay | Admitting: Pediatrics

## 2016-10-02 ENCOUNTER — Ambulatory Visit (INDEPENDENT_AMBULATORY_CARE_PROVIDER_SITE_OTHER): Payer: 59 | Admitting: Pediatrics

## 2016-10-02 ENCOUNTER — Ambulatory Visit (INDEPENDENT_AMBULATORY_CARE_PROVIDER_SITE_OTHER): Payer: 59

## 2016-10-02 VITALS — BP 132/85 | HR 84 | Temp 97.9°F | Ht 65.0 in | Wt 256.2 lb

## 2016-10-02 DIAGNOSIS — M25532 Pain in left wrist: Secondary | ICD-10-CM

## 2016-10-02 NOTE — Progress Notes (Signed)
  Subjective:   Patient ID: Paula Myers, female    DOB: Nov 04, 1985, 31 y.o.   MRN: [id number] CC: Wrist Pain (Left)  HPI: Paula Myers is a 31 y.o. female presenting for Wrist Pain (Left)  Larey SeatFell last night takin gwet leggings off in bathroom, caught herself with L wrist on a cabinet, hyperextended her L wrist Has had pain since then Some swelling in L fingers compared with R Took ibuprofen last night, helped some Hurts to flex/ext her wrist  Relevant past medical, surgical, family and social history reviewed. Allergies and medications reviewed and updated. History  Smoking Status  . Never Smoker  Smokeless Tobacco  . Never Used   ROS: Per HPI   Objective:    BP 132/85   Pulse 84   Temp 97.9 F (36.6 C) (Oral)   Ht 5\' 5"  (1.651 m)   Wt 256 lb 3.2 oz (116.2 kg)   BMI 42.63 kg/m   Wt Readings from Last 3 Encounters:  10/02/16 256 lb 3.2 oz (116.2 kg)  08/28/16 253 lb 12.8 oz (115.1 kg)  07/31/16 254 lb 6.4 oz (115.4 kg)    Gen: NAD, alert, cooperative with exam, NCAT EYES: EOMI, no conjunctival injection, or no icterus CV: distal pulses 2+ b/l Resp: normal WOB Neuro: Alert and oriented MSK: L hand with minimal swelling compared with R hand which has none No point tenderness L wrist Pain with supination, ext/flex L wrist  Assessment & Plan:  Judeth CornfieldStephanie was seen today for wrist pain.  Diagnoses and all orders for this visit:  Wrist pain, acute, left No fracture on xray NSAIDs, rest, ice Wrist brace for support If still hurting in a couple weeks rtc for repeat xray If worsening symptoms rtc sooner -     DG Wrist Complete Left; Future   Follow up plan: As needed Rex Krasarol Vincent, MD [facility]

## 2017-05-17 ENCOUNTER — Other Ambulatory Visit: Payer: Self-pay

## 2017-05-17 ENCOUNTER — Encounter (HOSPITAL_BASED_OUTPATIENT_CLINIC_OR_DEPARTMENT_OTHER): Payer: Self-pay | Admitting: Emergency Medicine

## 2017-05-17 ENCOUNTER — Emergency Department (HOSPITAL_BASED_OUTPATIENT_CLINIC_OR_DEPARTMENT_OTHER)
Admission: EM | Admit: 2017-05-17 | Discharge: 2017-05-17 | Disposition: A | Discharge status: Home / Self Care | Payer: 59 | Attending: Emergency Medicine | Admitting: Emergency Medicine

## 2017-05-17 DIAGNOSIS — N72 Inflammatory disease of cervix uteri: Secondary | ICD-10-CM | POA: Insufficient documentation

## 2017-05-17 DIAGNOSIS — Z79899 Other long term (current) drug therapy: Secondary | ICD-10-CM | POA: Insufficient documentation

## 2017-05-17 DIAGNOSIS — I10 Essential (primary) hypertension: Secondary | ICD-10-CM | POA: Insufficient documentation

## 2017-05-17 LAB — URINALYSIS, MICROSCOPIC (REFLEX)

## 2017-05-17 LAB — URINALYSIS, ROUTINE W REFLEX MICROSCOPIC
Bilirubin Urine: NEGATIVE
Glucose, UA: NEGATIVE mg/dL
Ketones, ur: NEGATIVE mg/dL
Nitrite: NEGATIVE
Protein, ur: NEGATIVE mg/dL
Specific Gravity, Urine: 1.015 (ref 1.005–1.030)
pH: 7 (ref 5.0–8.0)

## 2017-05-17 LAB — WET PREP, GENITAL
CLUE CELLS WET PREP: NONE SEEN
Sperm: NONE SEEN
Trich, Wet Prep: NONE SEEN
Yeast Wet Prep HPF POC: NONE SEEN

## 2017-05-17 LAB — PREGNANCY, URINE: PREG TEST UR: NEGATIVE

## 2017-05-17 MED ORDER — AZITHROMYCIN 250 MG PO TABS
1000.0000 mg | ORAL_TABLET | Freq: Once | ORAL | Status: AC
  Administered 2017-05-17: 1000 mg via ORAL
  Filled 2017-05-17: qty 4

## 2017-05-17 MED ORDER — CEFTRIAXONE SODIUM 250 MG IJ SOLR
250.0000 mg | Freq: Once | INTRAMUSCULAR | Status: AC
  Administered 2017-05-17: 250 mg via INTRAMUSCULAR
  Filled 2017-05-17: qty 250

## 2017-05-17 NOTE — ED Triage Notes (Signed)
Reports vaginal pain, dysuria, vaginal swelling and groin pain.  Also reports lower back pain.  Reports she was 4 wheeling this weekend and doesn't know if this could be related.  Reports vaginal discharge.

## 2017-05-17 NOTE — ED Provider Notes (Signed)
[facility] EMERGENCY DEPARTMENT Provider Note   CSN: [id number] Arrival date & time: 05/17/17  1816     History   Chief Complaint Chief Complaint  Patient presents with  . Vaginal Discharge  . Back Pain    HPI Paula Myers is a 32 y.o. female.  HPI Complains of bilateral groin pain and low back pain onset 4 days ago.  She also reports that she had sexual intercourse with her husband 2 nights ago""e ripped me alittle" she complains of dysuria and burning at urethral meatus for the past 2 days accompanied by vaginal discharge noted today after she inserted a Monistat vaginal tablet yesterday.  Symptoms worse with urination and mild.  No fever.  No other associated symptoms.  Last normal menstrual period 2 weeks ago Past Medical History:  Diagnosis Date  . Anemia    prior pregnancies  . Anxiety   . Depression   . Dysrhythmia    "skipping" HR at beginning of pregnancy  . H/O cervical polypectomy   . Heartburn during pregnancy   . Hypertension 2005   while in labor  . Kidney stone   . Palpitation   . SVT (supraventricular tachycardia) (HCC)   . Tachycardia     Patient Active Problem List   Diagnosis Date Noted  . Bacterial vaginosis 07/31/2016  . Delivery of pregnancy by cesarean section 07/31/2016  . Failure of lactation 07/31/2016  . Cervical high risk HPV (human papillomavirus) test positive 07/31/2016  . Morning sickness 07/31/2016  . Depression 10/02/2015  . Breast lump 10/02/2015  . Stress at home 10/02/2015  . BMI 40.0-44.9, adult (HCC) 10/02/2015  . History of cesarean section 08/22/2014  . Heart palpitations 01/15/2014  . Ventricular premature beats 01/15/2014  . Hypokalemia 01/15/2014  . SVT (supraventricular tachycardia) (HCC)     Past Surgical History:  Procedure Laterality Date  . CESAREAN SECTION    . CESAREAN SECTION WITH BILATERAL TUBAL LIGATION Bilateral 08/22/2014   Procedure: CESAREAN SECTION WITH BILATERAL TUBAL LIGATION;   Surgeon: Essie Hart, MD;  Location: [facility];  Service: Obstetrics;  Laterality: Bilateral;  . ELBOW SURGERY     Left    OB History    Gravida Para Term Preterm AB Living   4 3 3   1 1    SAB TAB Ectopic Multiple Live Births   1     0 1       Home Medications    Prior to Admission medications   Medication Sig Start Date End Date Taking? Authorizing Provider  busPIRone (BUSPAR) 10 MG tablet Take 1 tablet (10 mg total) by mouth 2 (two) times daily. 09/30/16   Johna Sheriff, MD  ferrous sulfate 325 (65 FE) MG tablet Take 325 mg by mouth daily with breakfast.    [provider]  lamoTRIgine (LAMICTAL) 100 MG tablet Take 100mg  daily 08/28/16   Johna Sheriff, MD  Multiple Vitamin (MULTIVITAMIN) tablet Take 1 tablet by mouth daily.    [provider]    Family History Family History  Problem Relation Age of Onset  . Hypertension Maternal Grandmother   . Heart disease Maternal Grandmother     Social History Social History   Tobacco Use  . Smoking status: Never Smoker  . Smokeless tobacco: Never Used  Substance Use Topics  . Alcohol use: No  . Drug use: No     Allergies   Levaquin [levofloxacin in d5w] and Pertussis vaccines   Review of Systems Review  of Systems  Constitutional: Negative.   HENT: Negative.   Respiratory: Negative.   Cardiovascular: Negative.   Gastrointestinal: Negative.   Genitourinary: Positive for vaginal discharge.       Bilateral groin pain  Musculoskeletal: Positive for back pain.  Skin: Negative.   Neurological: Negative.   Psychiatric/Behavioral: Negative.   All other systems reviewed and are negative.    Physical Exam Updated Vital Signs BP (!) 142/88 (BP Location: Right Arm) Comment: post-pelvic   Pulse 98   Temp 98.9 F (37.2 C) (Oral)   Resp 18   Ht 5\' 5"  (1.651 m)   Wt 108.9 kg (240 lb)   LMP 05/03/2017 (Approximate)   SpO2 99%   BMI 39.94 kg/m   Physical Exam  Constitutional: She appears  well-developed and well-nourished. No distress.  HENT:  Head: Normocephalic and atraumatic.  Eyes: Conjunctivae are normal. Pupils are equal, round, and reactive to light.  Neck: Neck supple. No tracheal deviation present. No thyromegaly present.  Cardiovascular: Normal rate and regular rhythm.  No murmur heard. Pulmonary/Chest: Effort normal and breath sounds normal.  Abdominal: Soft. Bowel sounds are normal. She exhibits no distension. There is no tenderness.  obese  Genitourinary:  Genitourinary Comments: Pelvic exam no external lesion.  Cervical loss closed.  Base discharge in vault, slight amount.  Positive cervical motion tenderness no adnexal masses or tenderness  Musculoskeletal: Normal range of motion. She exhibits no edema or tenderness.  Entire spine nontender.  All 4 extremities without redness swelling or tenderness neurovascularly intact  Neurological: She is alert. Coordination normal.  Skin: Skin is warm and dry. No rash noted.  Psychiatric: She has a normal mood and affect.  Nursing note and vitals reviewed.    ED Treatments / Results  Labs (all labs ordered are listed, but only abnormal results are displayed) Labs Reviewed  URINALYSIS, ROUTINE W REFLEX MICROSCOPIC - Abnormal; Notable for the following components:      Result Value   Hgb urine dipstick TRACE (*)    Leukocytes, UA SMALL (*)    All other components within normal limits  URINALYSIS, MICROSCOPIC (REFLEX) - Abnormal; Notable for the following components:   Bacteria, UA RARE (*)    Squamous Epithelial / LPF 6-30 (*)    All other components within normal limits  WET PREP, GENITAL  PREGNANCY, URINE  RPR  HIV ANTIBODY (ROUTINE TESTING)  GC/CHLAMYDIA PROBE AMP ([place]) NOT AT [facility]    EKG  EKG Interpretation None       Radiology No results found.  Procedures Procedures (including critical care time)  Medications Ordered in ED Medications - No data to display  Results for orders  placed or performed during the hospital encounter of 05/17/17  Wet prep, genital  Result Value Ref Range   Yeast Wet Prep HPF POC NONE SEEN NONE SEEN   Trich, Wet Prep NONE SEEN NONE SEEN   Clue Cells Wet Prep HPF POC NONE SEEN NONE SEEN   WBC, Wet Prep HPF POC MODERATE (A) NONE SEEN   Sperm NONE SEEN   Pregnancy, urine  Result Value Ref Range   Preg Test, Ur NEGATIVE NEGATIVE  Urinalysis, Routine w reflex microscopic  Result Value Ref Range   Color, Urine YELLOW YELLOW   APPearance CLEAR CLEAR   Specific Gravity, Urine 1.015 1.005 - 1.030   pH 7.0 5.0 - 8.0   Glucose, UA NEGATIVE NEGATIVE mg/dL   Hgb urine dipstick TRACE (A) NEGATIVE   Bilirubin Urine NEGATIVE NEGATIVE  Ketones, ur NEGATIVE NEGATIVE mg/dL   Protein, ur NEGATIVE NEGATIVE mg/dL   Nitrite NEGATIVE NEGATIVE   Leukocytes, UA SMALL (A) NEGATIVE  Urinalysis, Microscopic (reflex)  Result Value Ref Range   RBC / HPF 6-30 0 - 5 RBC/hpf   WBC, UA 6-30 0 - 5 WBC/hpf   Bacteria, UA RARE (A) NONE SEEN   Squamous Epithelial / LPF 6-30 (A) NONE SEEN   Mucus PRESENT    No results found. Initial Impression / Assessment and Plan / ED Course  I have reviewed the triage vital signs and the nursing notes.  Pertinent labs & imaging results that were available during my care of the patient were reviewed by me and considered in my medical decision making (see chart for details).     In light of cervical motion tenderness, vaginal discharge, we will treat for cervicitis.  Rocephin, Zithromax.  Just blood pressure recheck 3 weeks.  Safe sex encouraged.  Referral Center for women's health care  Final Clinical Impressions(s) / ED Diagnoses  Dx #1 cervicitis #2 elevated blood pressure Final diagnoses:  None    ED Discharge Orders    None       Doug Sou, MD 05/18/17 0006

## 2017-05-17 NOTE — ED Notes (Signed)
Pt verbalizes understanding of d/c instructions and denies any further needs at this time. 

## 2017-05-17 NOTE — Discharge Instructions (Signed)
Use a condom each time that you have sex until your tests for sexually transmitted diseases have been resulted.  You can take Tylenol as directed for pain.  Call [facility] to arrange to get a gynecologist.  Your blood pressure should be rechecked within the next 3 weeks.  Today's was mildly elevated at 142/88

## 2017-05-18 LAB — GC/CHLAMYDIA PROBE AMP (~~LOC~~) NOT AT ARMC
CHLAMYDIA, DNA PROBE: NEGATIVE
NEISSERIA GONORRHEA: NEGATIVE

## 2017-05-19 LAB — RPR: RPR: NONREACTIVE

## 2017-05-19 LAB — HIV ANTIBODY (ROUTINE TESTING W REFLEX): HIV SCREEN 4TH GENERATION: NONREACTIVE

## 2018-03-17 ENCOUNTER — Other Ambulatory Visit: Payer: Self-pay

## 2018-03-17 ENCOUNTER — Encounter (HOSPITAL_BASED_OUTPATIENT_CLINIC_OR_DEPARTMENT_OTHER): Payer: Self-pay

## 2018-03-17 ENCOUNTER — Emergency Department (HOSPITAL_BASED_OUTPATIENT_CLINIC_OR_DEPARTMENT_OTHER)
Admission: EM | Admit: 2018-03-17 | Discharge: 2018-03-17 | Disposition: A | Discharge status: Home / Self Care | Payer: Commercial Managed Care - PPO | Attending: Emergency Medicine | Admitting: Emergency Medicine

## 2018-03-17 ENCOUNTER — Emergency Department (HOSPITAL_BASED_OUTPATIENT_CLINIC_OR_DEPARTMENT_OTHER): Payer: Commercial Managed Care - PPO

## 2018-03-17 DIAGNOSIS — F329 Major depressive disorder, single episode, unspecified: Secondary | ICD-10-CM | POA: Diagnosis not present

## 2018-03-17 DIAGNOSIS — F419 Anxiety disorder, unspecified: Secondary | ICD-10-CM | POA: Insufficient documentation

## 2018-03-17 DIAGNOSIS — R0789 Other chest pain: Secondary | ICD-10-CM

## 2018-03-17 DIAGNOSIS — R079 Chest pain, unspecified: Secondary | ICD-10-CM | POA: Insufficient documentation

## 2018-03-17 DIAGNOSIS — I10 Essential (primary) hypertension: Secondary | ICD-10-CM | POA: Insufficient documentation

## 2018-03-17 DIAGNOSIS — Z79899 Other long term (current) drug therapy: Secondary | ICD-10-CM | POA: Insufficient documentation

## 2018-03-17 LAB — CBC WITH DIFFERENTIAL/PLATELET
Abs Immature Granulocytes: 0.03 10*3/uL (ref 0.00–0.07)
Basophils Absolute: 0.1 10*3/uL (ref 0.0–0.1)
Basophils Relative: 1 %
EOS PCT: 1 %
Eosinophils Absolute: 0.1 10*3/uL (ref 0.0–0.5)
HEMATOCRIT: 36.6 % (ref 36.0–46.0)
HEMOGLOBIN: 10.5 g/dL — AB (ref 12.0–15.0)
Immature Granulocytes: 0 %
LYMPHS PCT: 24 %
Lymphs Abs: 2.5 10*3/uL (ref 0.7–4.0)
MCH: 20.4 pg — AB (ref 26.0–34.0)
MCHC: 28.7 g/dL — AB (ref 30.0–36.0)
MCV: 71.2 fL — ABNORMAL LOW (ref 80.0–100.0)
MONO ABS: 0.7 10*3/uL (ref 0.1–1.0)
Monocytes Relative: 7 %
Neutro Abs: 7.3 10*3/uL (ref 1.7–7.7)
Neutrophils Relative %: 67 %
Platelets: 437 10*3/uL — ABNORMAL HIGH (ref 150–400)
RBC: 5.14 MIL/uL — AB (ref 3.87–5.11)
RDW: 18.5 % — ABNORMAL HIGH (ref 11.5–15.5)
WBC: 10.8 10*3/uL — ABNORMAL HIGH (ref 4.0–10.5)
nRBC: 0 % (ref 0.0–0.2)

## 2018-03-17 LAB — URINALYSIS, ROUTINE W REFLEX MICROSCOPIC
Bilirubin Urine: NEGATIVE
GLUCOSE, UA: NEGATIVE mg/dL
Ketones, ur: NEGATIVE mg/dL
Leukocytes, UA: NEGATIVE
Nitrite: NEGATIVE
Protein, ur: NEGATIVE mg/dL
pH: 7 (ref 5.0–8.0)

## 2018-03-17 LAB — BASIC METABOLIC PANEL
Anion gap: 7 (ref 5–15)
BUN: 10 mg/dL (ref 6–20)
CALCIUM: 9.2 mg/dL (ref 8.9–10.3)
CHLORIDE: 105 mmol/L (ref 98–111)
CO2: 23 mmol/L (ref 22–32)
CREATININE: 0.74 mg/dL (ref 0.44–1.00)
GFR calc Af Amer: >60 mL/min (ref >60)
GFR calc non Af Amer: >60 mL/min (ref >60)
Glucose, Bld: 102 mg/dL — ABNORMAL HIGH (ref 70–99)
Potassium: 3.7 mmol/L (ref 3.5–5.1)
Sodium: 135 mmol/L (ref 135–145)

## 2018-03-17 LAB — TROPONIN I

## 2018-03-17 LAB — D-DIMER, QUANTITATIVE: D-Dimer, Quant: 0.49 ug/mL-FEU (ref 0.00–0.50)

## 2018-03-17 LAB — PREGNANCY, URINE: Preg Test, Ur: NEGATIVE

## 2018-03-17 LAB — URINALYSIS, MICROSCOPIC (REFLEX)

## 2018-03-17 MED ORDER — METHOCARBAMOL 500 MG PO TABS: 500.0000 mg | ORAL_TABLET | Freq: Two times a day (BID) | ORAL | 0 refills | Status: DC

## 2018-03-17 MED ORDER — FERROUS SULFATE 325 (65 FE) MG PO TABS: 325.0000 mg | ORAL_TABLET | Freq: Every day | ORAL | 0 refills | Status: DC

## 2018-03-17 MED ORDER — KETOROLAC TROMETHAMINE 30 MG/ML IJ SOLN
30.0000 mg | Freq: Once | INTRAMUSCULAR | Status: AC
  Administered 2018-03-17: 30 mg via INTRAVENOUS
  Filled 2018-03-17: qty 1

## 2018-03-17 NOTE — Discharge Instructions (Addendum)
You can take Tylenol or Ibuprofen as directed for pain. You can alternate Tylenol and Ibuprofen every 4 hours. If you take Tylenol at 1pm, then you can take Ibuprofen at 5pm. Then you can take Tylenol again at 9pm.   Take Robaxin as prescribed. This medication will make you drowsy so do not drive or drink alcohol when taking it.  Take iron supplements as directed.  Take them with orange juice as this may help it better absorbed.  Follow-up with your primary care doctor as directed.  Follow-up with cardiology if symptoms worsen.  Return to the Emergency Department immediately if you experiencing worsening chest pain, difficulty breathing, nausea/vomiting, get very sweaty, headache or any other worsening or concerning symptoms.

## 2018-03-17 NOTE — ED Notes (Signed)
Pt on monitor 

## 2018-03-17 NOTE — ED Triage Notes (Signed)
C/o intermittent CP x "weeks"-was notified at health screening this week at work that she has "severe iron deficiency" -NAD-steady gait

## 2018-03-17 NOTE — ED Provider Notes (Signed)
[facility] EMERGENCY DEPARTMENT Provider Note   CSN: [id number] Arrival date & time: 03/17/18  1105     History   Chief Complaint Chief Complaint  Patient presents with  . Chest Pain    HPI Paula Myers is a 33 y.o. female with PMH/o anemia, Anxiety, Depression, SVT who presents for evaluation of chest discomfort.  She reports this is been ongoing for last several weeks.  She reports pain was intermittent and would come and go.  She states that no particular action brought on the pain.  She reports that since this morning, the pain has become more constant.  She describes it as a "heaviness" on her chest.  Patient states that she notices it more with moving but states it is not worse with exertion.  Patient states that she is also had some shortness of breath that she feels like is worse with exertion.  She reports that she still able to do her normal daily activities but does feel like when she starts walking around, her shortness of breath becomes worse.  She reports that the pain is not worse with deep inspiration.  Patient reports she has not taken any medications for the pain.  Patient reports that she was recently had health screening work-up done at work and noted to be iron deficient anemia.  She reports she is not on any supplementation for this.  She denies any rectal bleeding, bleeding from gums.  Her last menstrual cycle was approximately 2 weeks ago.  Patient reports a history of SVT during pregnancy where she had to wear a monitor.  No MI history.  Patient reports that she is not a current smoker.  She denies any alcohol use, cocaine use, IV drug use. She denies any OCP use, recent immobilization, prior history of DVT/PE, recent surgery, leg swelling, or long travel.  The history is provided by the patient.    Past Medical History:  Diagnosis Date  . Anemia    prior pregnancies  . Anxiety   . Depression   . Dysrhythmia    "skipping" HR at beginning of  pregnancy  . H/O cervical polypectomy   . Heartburn during pregnancy   . Hypertension 2005   while in labor  . Kidney stone   . Palpitation   . SVT (supraventricular tachycardia) (HCC)   . Tachycardia     Patient Active Problem List   Diagnosis Date Noted  . Bacterial vaginosis 07/31/2016  . Delivery of pregnancy by cesarean section 07/31/2016  . Failure of lactation 07/31/2016  . Cervical high risk HPV (human papillomavirus) test positive 07/31/2016  . Morning sickness 07/31/2016  . Depression 10/02/2015  . Breast lump 10/02/2015  . Stress at home 10/02/2015  . BMI 40.0-44.9, adult (HCC) 10/02/2015  . History of cesarean section 08/22/2014  . Heart palpitations 01/15/2014  . Ventricular premature beats 01/15/2014  . Hypokalemia 01/15/2014  . SVT (supraventricular tachycardia) (HCC)     Past Surgical History:  Procedure Laterality Date  . CESAREAN SECTION    . CESAREAN SECTION WITH BILATERAL TUBAL LIGATION Bilateral 08/22/2014   Procedure: CESAREAN SECTION WITH BILATERAL TUBAL LIGATION;  Surgeon: Essie Hart, MD;  Location: [facility];  Service: Obstetrics;  Laterality: Bilateral;  . ELBOW SURGERY     Left     OB History    Gravida  4   Para  3   Term  3   Preterm      AB  1   Living  1     SAB  1   TAB      Ectopic      Multiple  0   Live Births  1            Home Medications    Prior to Admission medications   Medication Sig Start Date End Date Taking? Authorizing Provider  busPIRone (BUSPAR) 10 MG tablet Take 1 tablet (10 mg total) by mouth 2 (two) times daily. 09/30/16   Johna Sheriff, MD  ferrous sulfate 325 (65 FE) MG tablet Take 1 tablet (325 mg total) by mouth daily. 03/17/18   Maxwell Caul, PA-C  lamoTRIgine (LAMICTAL) 100 MG tablet Take 100mg  daily 08/28/16   Johna Sheriff, MD  methocarbamol (ROBAXIN) 500 MG tablet Take 1 tablet (500 mg total) by mouth 2 (two) times daily. 03/17/18   Maxwell Caul, PA-C  Multiple Vitamin  (MULTIVITAMIN) tablet Take 1 tablet by mouth daily.    [provider]    Family History Family History  Problem Relation Age of Onset  . Hypertension Maternal Grandmother   . Heart disease Maternal Grandmother     Social History Social History   Tobacco Use  . Smoking status: Never Smoker  . Smokeless tobacco: Never Used  Substance Use Topics  . Alcohol use: No  . Drug use: No     Allergies   Levaquin [levofloxacin in d5w] and Pertussis vaccines   Review of Systems Review of Systems  Constitutional: Negative for chills and fever.  HENT: Negative for congestion.   Eyes: Negative for visual disturbance.  Respiratory: Positive for shortness of breath. Negative for cough.   Cardiovascular: Positive for chest pain. Negative for leg swelling.  Gastrointestinal: Negative for abdominal pain, diarrhea, nausea and vomiting.  Genitourinary: Negative for dysuria and hematuria.  Musculoskeletal: Negative for back pain and neck pain.  Skin: Negative for rash.  Neurological: Negative for dizziness, weakness, numbness and headaches.  Psychiatric/Behavioral: Negative for confusion.  All other systems reviewed and are negative.    Physical Exam Updated Vital Signs BP 108/76 (BP Location: Right Arm)   Pulse 83   Temp 98.1 F (36.7 C) (Oral)   Resp 15   Ht 5\' 5"  (1.651 m)   Wt 110.2 kg   LMP 03/03/2018 (Approximate)   SpO2 100%   BMI 40.44 kg/m   Physical Exam Vitals signs and nursing note reviewed.  Constitutional:      Appearance: Normal appearance. She is well-developed.  HENT:     Head: Normocephalic and atraumatic.  Eyes:     General: Lids are normal.     Conjunctiva/sclera: Conjunctivae normal.     Pupils: Pupils are equal, round, and reactive to light.  Neck:     Musculoskeletal: Full passive range of motion without pain.  Cardiovascular:     Rate and Rhythm: Normal rate and regular rhythm.     Pulses: Normal pulses.          Radial pulses are 2+  on the right side and 2+ on the left side.       Dorsalis pedis pulses are 2+ on the right side and 2+ on the left side.     Heart sounds: Normal heart sounds. No murmur. No friction rub. No gallop.   Pulmonary:     Effort: Pulmonary effort is normal.     Breath sounds: Normal breath sounds.     Comments: Lungs clear to auscultation bilaterally.  Symmetric chest rise.  No  wheezing, rales, rhonchi. Abdominal:     Palpations: Abdomen is soft. Abdomen is not rigid.     Tenderness: There is no abdominal tenderness. There is no guarding.  Musculoskeletal: Normal range of motion.     Comments: Bilateral lower extremities are symmetric in appearance without any overlying warmth, erythema, edema.  Skin:    General: Skin is warm and dry.     Capillary Refill: Capillary refill takes less than 2 seconds.  Neurological:     Mental Status: She is alert and oriented to person, place, and time.  Psychiatric:        Speech: Speech normal.      ED Treatments / Results  Labs (all labs ordered are listed, but only abnormal results are displayed) Labs Reviewed  BASIC METABOLIC PANEL - Abnormal; Notable for the following components:      Result Value   Glucose, Bld 102 (*)    All other components within normal limits  CBC WITH DIFFERENTIAL/PLATELET - Abnormal; Notable for the following components:   WBC 10.8 (*)    RBC 5.14 (*)    Hemoglobin 10.5 (*)    MCV 71.2 (*)    MCH 20.4 (*)    MCHC 28.7 (*)    RDW 18.5 (*)    Platelets 437 (*)    All other components within normal limits  URINALYSIS, ROUTINE W REFLEX MICROSCOPIC - Abnormal; Notable for the following components:   Specific Gravity, Urine <1.005 (*)    Hgb urine dipstick TRACE (*)    All other components within normal limits  URINALYSIS, MICROSCOPIC (REFLEX) - Abnormal; Notable for the following components:   Bacteria, UA RARE (*)    All other components within normal limits  TROPONIN I  PREGNANCY, URINE  D-DIMER, QUANTITATIVE (NOT  AT [facility])  TROPONIN I    EKG EKG Interpretation  Date/Time:  Thursday March 17 2018 11:19:21 EST Ventricular Rate:  93 PR Interval:    QRS Duration: 91 QT Interval:  358 QTC Calculation: 446 R Axis:   75 Text Interpretation:  Sinus rhythm Borderline T wave abnormalities new isolated flipped t wave in lead III Otherwise no significant change Confirmed by Melene Plan [phone]) on 03/17/2018 11:51:02 AM   Radiology Dg Chest 2 View  Result Date: 03/17/2018 CLINICAL DATA:  Chest pain over the past several weeks. Acute dizziness. Former smoker. EXAM: CHEST - 2 VIEW COMPARISON:  None. FINDINGS: Cardiomediastinal silhouette unremarkable. Lungs clear. Bronchovascular markings normal. Pulmonary vascularity normal. No pneumothorax. No pleural effusions. Visualized bony thorax intact. IMPRESSION: Normal examination. Electronically Signed   By: Hulan Saas M.D.   On: 03/17/2018 13:37    Procedures Procedures (including critical care time)  Medications Ordered in ED Medications  ketorolac (TORADOL) 30 MG/ML injection 30 mg (30 mg Intravenous Given 03/17/18 1251)     Initial Impression / Assessment and Plan / ED Course  I have reviewed the triage vital signs and the nursing notes.  Pertinent labs & imaging results that were available during my care of the patient were reviewed by me and considered in my medical decision making (see chart for details).     33 year old female who presents for evaluation of chest pain, exertional shortness of breath that is been ongoing for last several weeks.  She reports worsening today.  History of SVT during pregnancy.  No complications since then.  No recent PE risk factors.  The patient is complaining of exertional shortness of breath.  Patient also reports history of iron deficiency  anemia reports she is not currently on any supplements.  Patient is afebrile, non-toxic appearing, sitting comfortably on examination table. Vital signs reviewed and stable.   Consider ACS etiology versus infectious etiology versus musculoskeletal pain. Low suspicion for PE but given exertional SOB, will plan for Dimer.   D-dimer negative.  Initial troponin negative.  UA shows trace hemoglobin negative and for infectious etiology.  Urine pregnancy negative.  BMP is unremarkable.  CBC shows leukocytosis of 10.8.  Hemoglobin is 10.5.  Review of records is consistent with previous.  It does appear to be iron deficient anemia.  Platelets are 437.  EKG shows some new isolated flipped T waves in lead III.  Chest x-ray negative for any acute infectious etiology.  Given patient's risk factors, presentation, she has a heart score of 1.  Given new findings and EKG, will plan for delta trop.  Repeat troponin negative.  Troponin was collected at 12 and 3 PM.  There is a mistake in the epic medical record system that showed that the troponin been collected at 10 AM.  I verified with lab of collection time and patient's labwork available in the media tab.   Discussed results with patient.  Discussed with patient that given her history of SVT, she could follow-up with cardiology on outpatient basis to see if she needs monitoring.  She reports she has not had any palpitations or fluttering sensation but mostly chest discomfort. Given two negative trops here in the ED, do not suspect ACS etiology. At this time, patient exhibits no emergent life-threatening condition that require further evaluation in ED or admission.  Plan to start patient on iron supplements for anemia.  Patient had ample opportunity for questions and discussion. All patient's questions were answered with full understanding. Strict return precautions discussed. Patient expresses understanding and agreement to plan.   Portions of this note were generated with Scientist, clinical (histocompatibility and immunogenetics)Dragon dictation software. Dictation errors may occur despite best attempts at proofreading.  Final Clinical Impressions(s) / ED Diagnoses   Final diagnoses:  Atypical  chest pain    ED Discharge Orders         Ordered    methocarbamol (ROBAXIN) 500 MG tablet  2 times daily     03/17/18 1607    ferrous sulfate 325 (65 FE) MG tablet  Daily     03/17/18 1607           Maxwell CaulLayden, Cory Kitt A, PA-C 03/17/18 1613    Melene PlanFloyd, Dan, DO 03/18/18 [phone]

## 2018-12-13 ENCOUNTER — Other Ambulatory Visit: Payer: Self-pay

## 2018-12-13 DIAGNOSIS — Z20822 Contact with and (suspected) exposure to covid-19: Secondary | ICD-10-CM

## 2018-12-14 LAB — NOVEL CORONAVIRUS, NAA: SARS-CoV-2, NAA: NOT DETECTED

## 2019-01-05 ENCOUNTER — Encounter (HOSPITAL_BASED_OUTPATIENT_CLINIC_OR_DEPARTMENT_OTHER): Payer: Self-pay | Admitting: Emergency Medicine

## 2019-01-05 ENCOUNTER — Emergency Department (HOSPITAL_BASED_OUTPATIENT_CLINIC_OR_DEPARTMENT_OTHER): Payer: Commercial Managed Care - PPO

## 2019-01-05 ENCOUNTER — Emergency Department (HOSPITAL_BASED_OUTPATIENT_CLINIC_OR_DEPARTMENT_OTHER)
Admission: EM | Admit: 2019-01-05 | Discharge: 2019-01-05 | Disposition: A | Discharge status: Home / Self Care | Payer: Commercial Managed Care - PPO | Attending: Emergency Medicine | Admitting: Emergency Medicine

## 2019-01-05 ENCOUNTER — Other Ambulatory Visit: Payer: Self-pay

## 2019-01-05 DIAGNOSIS — R079 Chest pain, unspecified: Secondary | ICD-10-CM | POA: Diagnosis present

## 2019-01-05 DIAGNOSIS — Z79899 Other long term (current) drug therapy: Secondary | ICD-10-CM | POA: Diagnosis not present

## 2019-01-05 DIAGNOSIS — D649 Anemia, unspecified: Secondary | ICD-10-CM | POA: Diagnosis not present

## 2019-01-05 DIAGNOSIS — R0789 Other chest pain: Secondary | ICD-10-CM

## 2019-01-05 DIAGNOSIS — R0602 Shortness of breath: Secondary | ICD-10-CM | POA: Diagnosis not present

## 2019-01-05 LAB — CBC WITH DIFFERENTIAL/PLATELET
Abs Immature Granulocytes: 0.04 10*3/uL (ref 0.00–0.07)
Basophils Absolute: 0.1 10*3/uL (ref 0.0–0.1)
Basophils Relative: 1 %
Eosinophils Absolute: 0.2 10*3/uL (ref 0.0–0.5)
Eosinophils Relative: 2 %
HCT: 34.2 % — ABNORMAL LOW (ref 36.0–46.0)
Hemoglobin: 9.9 g/dL — ABNORMAL LOW (ref 12.0–15.0)
Immature Granulocytes: 0 %
Lymphocytes Relative: 31 %
Lymphs Abs: 3.4 10*3/uL (ref 0.7–4.0)
MCH: 20.1 pg — ABNORMAL LOW (ref 26.0–34.0)
MCHC: 28.9 g/dL — ABNORMAL LOW (ref 30.0–36.0)
MCV: 69.4 fL — ABNORMAL LOW (ref 80.0–100.0)
Monocytes Absolute: 1 10*3/uL (ref 0.1–1.0)
Monocytes Relative: 9 %
Neutro Abs: 6.5 10*3/uL (ref 1.7–7.7)
Neutrophils Relative %: 57 %
Platelets: 348 10*3/uL (ref 150–400)
RBC: 4.93 MIL/uL (ref 3.87–5.11)
RDW: 19.5 % — ABNORMAL HIGH (ref 11.5–15.5)
WBC: 11.2 10*3/uL — ABNORMAL HIGH (ref 4.0–10.5)
nRBC: 0 % (ref 0.0–0.2)

## 2019-01-05 LAB — TROPONIN I (HIGH SENSITIVITY): Troponin I (High Sensitivity): <2 ng/L (ref <18)

## 2019-01-05 LAB — BASIC METABOLIC PANEL
Anion gap: 9 (ref 5–15)
BUN: 11 mg/dL (ref 6–20)
CO2: 23 mmol/L (ref 22–32)
Calcium: 9.2 mg/dL (ref 8.9–10.3)
Chloride: 105 mmol/L (ref 98–111)
Creatinine, Ser: 0.83 mg/dL (ref 0.44–1.00)
GFR calc Af Amer: >60 mL/min (ref >60)
GFR calc non Af Amer: >60 mL/min (ref >60)
Glucose, Bld: 98 mg/dL (ref 70–99)
Potassium: 4 mmol/L (ref 3.5–5.1)
Sodium: 137 mmol/L (ref 135–145)

## 2019-01-05 LAB — PREGNANCY, URINE: Preg Test, Ur: NEGATIVE

## 2019-01-05 NOTE — ED Provider Notes (Addendum)
[facility] EMERGENCY DEPARTMENT Provider Note   CSN: [id number] Arrival date & time: 01/05/19  0736     History   Chief Complaint Chief Complaint  Patient presents with  . Chest Pain    HPI Paula Myers is a 33 y.o. female.     33 year old female with past medical history including gestational hypertension, SVT, kidney stone, anxiety/depression who presents with chest pain.  Patient states that around 4 AM this morning, she awoke with a heart fluttering or "quivering" sensation.  She had associated feelings of mild shortness of breath and lightheadedness as well as some diaphoresis.  She then began having some chest tightness.  This has been constant since 4 AM.  Her other symptoms have resolved.  She denies any leg swelling/pain, cough/cold symptoms, recent illness, recent travel, OCP use, history of blood clots, or family history of heart disease.  No drug or alcohol use.  The history is provided by the patient.  Chest Pain   Past Medical History:  Diagnosis Date  . Anemia    prior pregnancies  . Anxiety   . Depression   . Dysrhythmia    "skipping" HR at beginning of pregnancy  . H/O cervical polypectomy   . Heartburn during pregnancy   . Hypertension 2005   while in labor  . Kidney stone   . Palpitation   . SVT (supraventricular tachycardia) (HCC)   . Tachycardia     Patient Active Problem List   Diagnosis Date Noted  . Bacterial vaginosis 07/31/2016  . Delivery of pregnancy by cesarean section 07/31/2016  . Failure of lactation 07/31/2016  . Cervical high risk HPV (human papillomavirus) test positive 07/31/2016  . Morning sickness 07/31/2016  . Depression 10/02/2015  . Breast lump 10/02/2015  . Stress at home 10/02/2015  . BMI 40.0-44.9, adult (HCC) 10/02/2015  . History of cesarean section 08/22/2014  . Heart palpitations 01/15/2014  . Ventricular premature beats 01/15/2014  . Hypokalemia 01/15/2014  . SVT (supraventricular  tachycardia) (HCC)     Past Surgical History:  Procedure Laterality Date  . CESAREAN SECTION    . CESAREAN SECTION WITH BILATERAL TUBAL LIGATION Bilateral 08/22/2014   Procedure: CESAREAN SECTION WITH BILATERAL TUBAL LIGATION;  Surgeon: Essie HartWalda Pinn, MD;  Location: [facility];  Service: Obstetrics;  Laterality: Bilateral;  . ELBOW SURGERY     Left     OB History    Gravida  4   Para  3   Term  3   Preterm      AB  1   Living  1     SAB  1   TAB      Ectopic      Multiple  0   Live Births  1            Home Medications    Prior to Admission medications   Medication Sig Start Date End Date Taking? Authorizing Provider  busPIRone (BUSPAR) 10 MG tablet Take 1 tablet (10 mg total) by mouth 2 (two) times daily. 09/30/16   Johna SheriffVincent, Carol L, MD  cephALEXin (KEFLEX) 500 MG capsule Take 500 mg by mouth every 8 (eight) hours. 12/31/18   [provider]  ferrous sulfate 325 (65 FE) MG tablet Take 1 tablet (325 mg total) by mouth daily. 03/17/18   Maxwell CaulLayden, Lindsey A, PA-C  lamoTRIgine (LAMICTAL) 100 MG tablet Take 100mg  daily 08/28/16   Johna SheriffVincent, Carol L, MD  methocarbamol (ROBAXIN) 500 MG tablet Take 1 tablet (500  mg total) by mouth 2 (two) times daily. 03/17/18   Volanda Napoleon, PA-C  Multiple Vitamin (MULTIVITAMIN) tablet Take 1 tablet by mouth daily.    [provider]    Family History Family History  Problem Relation Age of Onset  . Hypertension Maternal Grandmother   . Heart disease Maternal Grandmother     Social History Social History   Tobacco Use  . Smoking status: Never Smoker  . Smokeless tobacco: Never Used  Substance Use Topics  . Alcohol use: No  . Drug use: No     Allergies   Levaquin [levofloxacin in d5w], Levofloxacin, and Pertussis vaccines   Review of Systems Review of Systems  Cardiovascular: Positive for chest pain.   All other systems reviewed and are negative except that which was mentioned in HPI   Physical Exam  Updated Vital Signs BP (!) 153/90   Pulse 85   Resp 16   Ht 5\' 5"  (1.651 m)   Wt 106.1 kg   LMP 01/04/2019   SpO2 100%   BMI 38.94 kg/m   Physical Exam Vitals signs and nursing note reviewed.  Constitutional:      General: She is not in acute distress.    Appearance: She is well-developed.  HENT:     Head: Normocephalic and atraumatic.  Eyes:     Conjunctiva/sclera: Conjunctivae normal.  Neck:     Musculoskeletal: Neck supple.  Cardiovascular:     Rate and Rhythm: Normal rate and regular rhythm.     Heart sounds: Normal heart sounds. No murmur.  Pulmonary:     Effort: Pulmonary effort is normal.     Breath sounds: Normal breath sounds.  Abdominal:     General: Bowel sounds are normal. There is no distension.     Palpations: Abdomen is soft.     Tenderness: There is no abdominal tenderness.  Musculoskeletal:     Right lower leg: She exhibits no tenderness.     Left lower leg: She exhibits no tenderness.  Skin:    General: Skin is warm and dry.  Neurological:     Mental Status: She is alert and oriented to person, place, and time.     Comments: Fluent speech  Psychiatric:        Judgment: Judgment normal.      ED Treatments / Results  Labs (all labs ordered are listed, but only abnormal results are displayed) Labs Reviewed  CBC WITH DIFFERENTIAL/PLATELET - Abnormal; Notable for the following components:      Result Value   WBC 11.2 (*)    Hemoglobin 9.9 (*)    HCT 34.2 (*)    MCV 69.4 (*)    MCH 20.1 (*)    MCHC 28.9 (*)    RDW 19.5 (*)    All other components within normal limits  BASIC METABOLIC PANEL  PREGNANCY, URINE  TROPONIN I (HIGH SENSITIVITY)    EKG EKG Interpretation  Date/Time:  Thursday January 05 2019 07:47:28 EST Ventricular Rate:  83 PR Interval:    QRS Duration: 91 QT Interval:  379 QTC Calculation: 446 R Axis:   84 Text Interpretation: Sinus rhythm EKG WITHIN NORMAL LIMITS Confirmed by Theotis Burrow [phone]) on 01/05/2019  10:02:25 AM   Radiology Dg Chest 2 View  Result Date: 01/05/2019 CLINICAL DATA:  Chest tightness and cardiac palpitations EXAM: CHEST - 2 VIEW COMPARISON:  March 17, 2018 FINDINGS: Lungs are clear. Heart size and pulmonary vascularity are normal. No adenopathy. No pneumothorax. No bone  lesions. IMPRESSION: No edema or consolidation. Electronically Signed   By: Bretta Bang III M.D.   On: 01/05/2019 08:52    Procedures Procedures (including critical care time)  Medications Ordered in ED Medications - No data to display   Initial Impression / Assessment and Plan / ED Course  I have reviewed the triage vital signs and the nursing notes.  Pertinent labs & imaging results that were available during my care of the patient were reviewed by me and considered in my medical decision making (see chart for details).        Well-appearing on exam, mildly hypertensive.  EKG unremarkable.  Patient is PERC negative thus PE very unlikely. Single HS troponin is sufficient given constant symptoms for 4 hours.  Labwork reassuring w/ normal trop. CXR clear. Pt has chronic anemia likely 2/2 heavy period. Has iron but sometimes forgets to take it. Encouraged compliance and PCP f/u for recheck of Hgb.   HEART score is 1 and with atypical story, I doubt ACS. I have instructed to f/u with PCP regarding blood pressure and symptoms. Return precautions reviewed and she voiced understanding.   Final Clinical Impressions(s) / ED Diagnoses   Final diagnoses:  Atypical chest pain  Anemia, unspecified type    ED Discharge Orders    None       Pastor Sgro, Ambrose Finland, MD 01/05/19 1000    Tripton Ned, Ambrose Finland, MD 01/05/19 1002

## 2019-01-05 NOTE — ED Triage Notes (Signed)
Chest pain since last night.  Initially felt like the heart was fluttering but now feels tight.  Some sob and lightheadedness upon start.  Some diaphoresis at the onset.  No n/v.  Has history of pvc's.

## 2019-01-26 ENCOUNTER — Emergency Department (HOSPITAL_COMMUNITY)
Admission: EM | Admit: 2019-01-26 | Discharge: 2019-01-26 | Disposition: A | Discharge status: Home / Self Care | Payer: Commercial Managed Care - PPO | Attending: Emergency Medicine | Admitting: Emergency Medicine

## 2019-01-26 ENCOUNTER — Emergency Department (HOSPITAL_COMMUNITY): Payer: Commercial Managed Care - PPO

## 2019-01-26 ENCOUNTER — Other Ambulatory Visit: Payer: Self-pay

## 2019-01-26 ENCOUNTER — Encounter (HOSPITAL_COMMUNITY): Payer: Self-pay | Admitting: Emergency Medicine

## 2019-01-26 DIAGNOSIS — R1030 Lower abdominal pain, unspecified: Secondary | ICD-10-CM

## 2019-01-26 DIAGNOSIS — R110 Nausea: Secondary | ICD-10-CM | POA: Diagnosis not present

## 2019-01-26 DIAGNOSIS — D649 Anemia, unspecified: Secondary | ICD-10-CM | POA: Diagnosis not present

## 2019-01-26 DIAGNOSIS — R1031 Right lower quadrant pain: Secondary | ICD-10-CM | POA: Insufficient documentation

## 2019-01-26 DIAGNOSIS — R102 Pelvic and perineal pain: Secondary | ICD-10-CM

## 2019-01-26 DIAGNOSIS — I10 Essential (primary) hypertension: Secondary | ICD-10-CM | POA: Diagnosis not present

## 2019-01-26 DIAGNOSIS — Z79899 Other long term (current) drug therapy: Secondary | ICD-10-CM | POA: Insufficient documentation

## 2019-01-26 LAB — COMPREHENSIVE METABOLIC PANEL
ALT: 21 U/L (ref 0–44)
AST: 18 U/L (ref 15–41)
Albumin: 3.7 g/dL (ref 3.5–5.0)
Alkaline Phosphatase: 59 U/L (ref 38–126)
Anion gap: 10 (ref 5–15)
BUN: 7 mg/dL (ref 6–20)
CO2: 23 mmol/L (ref 22–32)
Calcium: 9.2 mg/dL (ref 8.9–10.3)
Chloride: 106 mmol/L (ref 98–111)
Creatinine, Ser: 0.72 mg/dL (ref 0.44–1.00)
GFR calc Af Amer: >60 mL/min (ref >60)
GFR calc non Af Amer: >60 mL/min (ref >60)
Glucose, Bld: 106 mg/dL — ABNORMAL HIGH (ref 70–99)
Potassium: 4.6 mmol/L (ref 3.5–5.1)
Sodium: 139 mmol/L (ref 135–145)
Total Bilirubin: 0.4 mg/dL (ref 0.3–1.2)
Total Protein: 7.1 g/dL (ref 6.5–8.1)

## 2019-01-26 LAB — CBC
HCT: 34.5 % — ABNORMAL LOW (ref 36.0–46.0)
Hemoglobin: 10.2 g/dL — ABNORMAL LOW (ref 12.0–15.0)
MCH: 20.7 pg — ABNORMAL LOW (ref 26.0–34.0)
MCHC: 29.6 g/dL — ABNORMAL LOW (ref 30.0–36.0)
MCV: 70.1 fL — ABNORMAL LOW (ref 80.0–100.0)
Platelets: 347 10*3/uL (ref 150–400)
RBC: 4.92 MIL/uL (ref 3.87–5.11)
RDW: 20.5 % — ABNORMAL HIGH (ref 11.5–15.5)
WBC: 12.9 10*3/uL — ABNORMAL HIGH (ref 4.0–10.5)
nRBC: 0 % (ref 0.0–0.2)

## 2019-01-26 LAB — URINALYSIS, ROUTINE W REFLEX MICROSCOPIC
Bilirubin Urine: NEGATIVE
Glucose, UA: NEGATIVE mg/dL
Hgb urine dipstick: NEGATIVE
Ketones, ur: NEGATIVE mg/dL
Leukocytes,Ua: NEGATIVE
Nitrite: NEGATIVE
Protein, ur: NEGATIVE mg/dL
Specific Gravity, Urine: 1.008 (ref 1.005–1.030)
pH: 7 (ref 5.0–8.0)

## 2019-01-26 LAB — LIPASE, BLOOD: Lipase: 33 U/L (ref 11–51)

## 2019-01-26 LAB — WET PREP, GENITAL
Sperm: NONE SEEN
Trich, Wet Prep: NONE SEEN
Yeast Wet Prep HPF POC: NONE SEEN

## 2019-01-26 LAB — I-STAT BETA HCG BLOOD, ED (MC, WL, AP ONLY): I-stat hCG, quantitative: <5 m[IU]/mL (ref <5)

## 2019-01-26 MED ORDER — NAPROXEN 500 MG PO TABS: 500.0000 mg | ORAL_TABLET | Freq: Two times a day (BID) | ORAL | 0 refills | Status: AC

## 2019-01-26 MED ORDER — IOHEXOL 300 MG/ML  SOLN
100.0000 mL | Freq: Once | INTRAMUSCULAR | Status: AC | PRN
  Administered 2019-01-26: 100 mL via INTRAVENOUS

## 2019-01-26 NOTE — Discharge Instructions (Addendum)
We discussed the results of your ultrasound today.  You will need to have a repeat ultrasound within 6 to 12 weeks to reassess the enlargement of your right ovary.  I have provided a prescription for an anti-inflammatory, please take 1 tablet twice a day for the next 7 days.   Follow-up with your oncologist as needed.  If you experience any vomiting, worsening symptoms please return to the ED.

## 2019-01-26 NOTE — ED Triage Notes (Signed)
Pt in with RLQ, sharp and noticed when waking this am. Denies n/v/d, urinary symptoms or vaginal discharge. Tender on palpation, pain radiates to back. Still has appendix and gallbladder

## 2019-01-26 NOTE — ED Notes (Signed)
Patient transported to CT 

## 2019-01-26 NOTE — ED Provider Notes (Addendum)
[facility] Provider Note   CSN: [id number] Arrival date & time: 01/26/19  1217     History   Chief Complaint Chief Complaint  Patient presents with  . Abdominal Pain    HPI Paula Myers is a 33 y.o. female.     HPI Paula Myers is a 33 y.o. female with history of anemia, hypertension, SVT, presents to emergency department with complaint of abdominal pain.  Patient states that she woke up this morning with pain in her right lower abdomen.  Throughout the morning pain persisted.  She states she took ibuprofen but it did not help.  She denies any urinary symptoms.  She denies any flank pain.  She denies any vomiting or diarrhea.  She states she has had nausea and decrease in appetite.  She states pain is worsened when she is walking or moving.  She states her abdomen is very tender as well.  She denies any fever or chills. No hx of similar pain.  States she has had kidney stones in the past but this does not feel similar.  No other complaints. Does not believe hs ie pregnant. Hx of tubal ligation.   Past Medical History:  Diagnosis Date  . Anemia    prior pregnancies  . Anxiety   . Depression   . Dysrhythmia    "skipping" HR at beginning of pregnancy  . H/O cervical polypectomy   . Heartburn during pregnancy   . Hypertension 2005   while in labor  . Kidney stone   . Palpitation   . SVT (supraventricular tachycardia) (HCC)   . Tachycardia     Patient Active Problem List   Diagnosis Date Noted  . Bacterial vaginosis 07/31/2016  . Delivery of pregnancy by cesarean section 07/31/2016  . Failure of lactation 07/31/2016  . Cervical high risk HPV (human papillomavirus) test positive 07/31/2016  . Morning sickness 07/31/2016  . Depression 10/02/2015  . Breast lump 10/02/2015  . Stress at home 10/02/2015  . BMI 40.0-44.9, adult (HCC) 10/02/2015  . History of cesarean section 08/22/2014  . Heart palpitations 01/15/2014  .  Ventricular premature beats 01/15/2014  . Hypokalemia 01/15/2014  . SVT (supraventricular tachycardia) (HCC)     Past Surgical History:  Procedure Laterality Date  . CESAREAN SECTION    . CESAREAN SECTION WITH BILATERAL TUBAL LIGATION Bilateral 08/22/2014   Procedure: CESAREAN SECTION WITH BILATERAL TUBAL LIGATION;  Surgeon: Essie HartWalda Pinn, MD;  Location: [facility];  Service: Obstetrics;  Laterality: Bilateral;  . ELBOW SURGERY     Left     OB History    Gravida  4   Para  3   Term  3   Preterm      AB  1   Living  1     SAB  1   TAB      Ectopic      Multiple  0   Live Births  1            Home Medications    Prior to Admission medications   Medication Sig Start Date End Date Taking? Authorizing Provider  busPIRone (BUSPAR) 10 MG tablet Take 1 tablet (10 mg total) by mouth 2 (two) times daily. 09/30/16   Johna SheriffVincent, Carol L, MD  cephALEXin (KEFLEX) 500 MG capsule Take 500 mg by mouth every 8 (eight) hours. 12/31/18   [provider]  ferrous sulfate 325 (65 FE) MG tablet Take 1 tablet (325 mg total)  by mouth daily. 03/17/18   Volanda Napoleon, PA-C  lamoTRIgine (LAMICTAL) 100 MG tablet Take 100mg  daily 08/28/16   Eustaquio Maize, MD  methocarbamol (ROBAXIN) 500 MG tablet Take 1 tablet (500 mg total) by mouth 2 (two) times daily. 03/17/18   Volanda Napoleon, PA-C  Multiple Vitamin (MULTIVITAMIN) tablet Take 1 tablet by mouth daily.    [provider]    Family History Family History  Problem Relation Age of Onset  . Hypertension Maternal Grandmother   . Heart disease Maternal Grandmother     Social History Social History   Tobacco Use  . Smoking status: Never Smoker  . Smokeless tobacco: Never Used  Substance Use Topics  . Alcohol use: No  . Drug use: No     Allergies   Levaquin [levofloxacin in d5w], Levofloxacin, and Pertussis vaccines   Review of Systems Review of Systems  Constitutional: Negative for chills and fever.   Respiratory: Negative for cough, chest tightness and shortness of breath.   Cardiovascular: Negative for chest pain, palpitations and leg swelling.  Gastrointestinal: Positive for abdominal pain and nausea. Negative for diarrhea and vomiting.  Genitourinary: Negative for dysuria, flank pain, pelvic pain, vaginal bleeding, vaginal discharge and vaginal pain.  Musculoskeletal: Negative for arthralgias, myalgias, neck pain and neck stiffness.  Skin: Negative for rash.  Neurological: Negative for dizziness, weakness and headaches.  All other systems reviewed and are negative.    Physical Exam Updated Vital Signs BP 117/73 (BP Location: Left Arm)   Pulse 90   Temp 98.5 F (36.9 C) (Oral)   Resp 20   Wt 106 kg   LMP 01/04/2019   SpO2 97%   BMI 38.89 kg/m   Physical Exam Vitals signs and nursing note reviewed.  Constitutional:      General: She is not in acute distress.    Appearance: She is well-developed.  HENT:     Head: Normocephalic.  Eyes:     Conjunctiva/sclera: Conjunctivae normal.  Neck:     Musculoskeletal: Neck supple.  Cardiovascular:     Rate and Rhythm: Normal rate and regular rhythm.     Heart sounds: Normal heart sounds.  Pulmonary:     Effort: Pulmonary effort is normal. No respiratory distress.     Breath sounds: Normal breath sounds. No wheezing or rales.  Abdominal:     General: Bowel sounds are normal. There is no distension.     Palpations: Abdomen is soft.     Tenderness: There is abdominal tenderness in the right lower quadrant. There is no rebound. Positive signs include McBurney's sign and psoas sign. Negative signs include Murphy's sign.  Genitourinary:    Comments: Normal external genitalia. Normal vaginal canal. Small thin white discharge. Cervix is normal, closed. No CMT. Right and left adnexal tenderness. No masses palpated.   Skin:    General: Skin is warm and dry.  Neurological:     Mental Status: She is alert.  Psychiatric:         Behavior: Behavior normal.      ED Treatments / Results  Labs (all labs ordered are listed, but only abnormal results are displayed) Labs Reviewed  WET PREP, GENITAL - Abnormal; Notable for the following components:      Result Value   Clue Cells Wet Prep HPF POC PRESENT (*)    WBC, Wet Prep HPF POC FEW (*)    All other components within normal limits  COMPREHENSIVE METABOLIC PANEL - Abnormal; Notable for the  following components:   Glucose, Bld 106 (*)    All other components within normal limits  CBC - Abnormal; Notable for the following components:   WBC 12.9 (*)    Hemoglobin 10.2 (*)    HCT 34.5 (*)    MCV 70.1 (*)    MCH 20.7 (*)    MCHC 29.6 (*)    RDW 20.5 (*)    All other components within normal limits  LIPASE, BLOOD  URINALYSIS, ROUTINE W REFLEX MICROSCOPIC  I-STAT BETA HCG BLOOD, ED ([facility], [facility], [facility] ONLY)  GC/CHLAMYDIA PROBE AMP ([place]) NOT AT [facility]    EKG None  Radiology No results found.  Procedures Procedures (including critical care time)  Medications Ordered in ED Medications - No data to display   Initial Impression / Assessment and Plan / ED Course  I have reviewed the triage vital signs and the nursing notes.  Pertinent labs & imaging results that were available during my care of the patient were reviewed by me and considered in my medical decision making (see chart for details).  Clinical Course as of Feb 24 656  Thu Jan 26, 2019  1702 Clue Cells Wet Prep HPF POC(!): PRESENT [JS]  1702 WBC, Wet Prep HPF POC(!): FEW [JS]    Clinical Course User Index [JS] Claude Manges, PA-C      Pt seen and examined upon arrival to the room. Pt with RLQ tenderness. Tender in McBurney pont. Pelvic exam unremarkable other than some tenderness over RLQ and LLQ/adnexa. Will get CT for further evaluation to ro appendicitis. Other differential includes kidney stone, ovarian torsion but less likely given the location of the pain, tubo-ovarian abscess.   Pregnancy test is negative today.  3:40 PM CT negative for appendicitis.  Repeat CT scan does show enlarged right ovary, concerning for possible ovarian torsion.  Ultrasound ordered.  Patient signed out at shift change pending ultrasound.  If negative, patient is okay to be discharged home with some pain management. I discussed results and plan with pt who agree.   Pt signed out at shift change pending [place]. Disposition based on results.   Final Clinical Impressions(s) / ED Diagnoses   Final diagnoses:  Lower abdominal pain    ED Discharge Orders    None       Jaynie Crumble, PA-C 01/26/19 1541    Jaynie Crumble, PA-C 02/24/19 3382    Gerhard Munch, MD 02/25/19 [phone]

## 2019-01-26 NOTE — ED Provider Notes (Addendum)
  Physical Exam  BP 128/67 (BP Location: Left Arm)   Pulse 89   Temp 98.5 F (36.9 C) (Oral)   Resp 17   Wt 106 kg   LMP 01/04/2019   SpO2 100%   BMI 38.89 kg/m   Physical Exam  ED Course/Procedures   Clinical Course as of Jan 25 1705  Thu Jan 26, 2019  1702 Clue Cells Wet Prep HPF POC(!): PRESENT [JS]  1702 WBC, Wet Prep HPF POC(!): FEW [JS]    Clinical Course User Index [JS] Janeece Fitting, PA-C    Procedures  MDM  Patient care assumed from [place] at shift change, please see her note for a full HPI. Briefly, patient with lower abdominal pain, was waking up this morning with this pain along her right abdomen.  Pain has persisted.  No prior history of kidney stones, history of tubal ligation. Plan is for pending ultrasound to rule out torsion.  Patient will also need prescription for home anti-inflammatories.  US Pelvic showed: 1. Right ovary with signs of low resistance vascular flow.  Nonspecific mild enlargement without surrounding fluid. If there are  intermittent symptoms or clinical concern for intermittent torsion  repeat examination could be performed.  2. Signs of prior C-section.  3. No free fluid.  4. Left ovary not visualized.     5:05 PM Based on results will page GYN in order to obtain further recommendations.   5:18 PM Spoke to Dr. Harle Stanford GYN, who reviewed [place] and recommended outpatient imaging in 6 to 12 weeks.  I have discussed results of ultrasound with patient at length, she is aware she will need to obtain a repeat ultrasound in 6 to 12 weeks.  She currently is relocating to [place], will follow up with a new OB/GYN.  Short prescription for anti-inflammatories has been provided on today's visit. Return precautions discussed at length.    I have discussed case with Dr. Johnney Killian who agrees with plan and management.   Portions of this note were generated with [employer]. Dictation errors may occur despite best attempts at  [place], Mellanie Bejarano, PA-C 01/26/19 [place], Christoher Drudge, PA-C 01/26/19 1733    Charlesetta Shanks, MD 01/27/19 [phone]

## 2019-01-30 LAB — GC/CHLAMYDIA PROBE AMP (~~LOC~~) NOT AT ARMC
Chlamydia: NEGATIVE
Neisseria Gonorrhea: NEGATIVE

## 2019-02-01 ENCOUNTER — Other Ambulatory Visit: Payer: Self-pay

## 2019-02-01 DIAGNOSIS — Z20822 Contact with and (suspected) exposure to covid-19: Secondary | ICD-10-CM

## 2019-02-03 LAB — NOVEL CORONAVIRUS, NAA: SARS-CoV-2, NAA: DETECTED — AB

## 2019-09-22 ENCOUNTER — Encounter (HOSPITAL_COMMUNITY): Payer: Self-pay

## 2019-09-22 ENCOUNTER — Other Ambulatory Visit: Payer: Self-pay

## 2019-09-22 ENCOUNTER — Emergency Department (HOSPITAL_COMMUNITY)
Admission: EM | Admit: 2019-09-22 | Discharge: 2019-09-22 | Disposition: A | Discharge status: Home / Self Care | Payer: Commercial Managed Care - PPO | Attending: Emergency Medicine | Admitting: Emergency Medicine

## 2019-09-22 ENCOUNTER — Emergency Department (HOSPITAL_COMMUNITY): Payer: Commercial Managed Care - PPO

## 2019-09-22 DIAGNOSIS — F17290 Nicotine dependence, other tobacco product, uncomplicated: Secondary | ICD-10-CM | POA: Diagnosis not present

## 2019-09-22 DIAGNOSIS — I10 Essential (primary) hypertension: Secondary | ICD-10-CM | POA: Diagnosis not present

## 2019-09-22 DIAGNOSIS — R079 Chest pain, unspecified: Secondary | ICD-10-CM | POA: Insufficient documentation

## 2019-09-22 DIAGNOSIS — Z79899 Other long term (current) drug therapy: Secondary | ICD-10-CM | POA: Insufficient documentation

## 2019-09-22 LAB — BASIC METABOLIC PANEL
Anion gap: 8 (ref 5–15)
BUN: 11 mg/dL (ref 6–20)
CO2: 22 mmol/L (ref 22–32)
Calcium: 9 mg/dL (ref 8.9–10.3)
Chloride: 107 mmol/L (ref 98–111)
Creatinine, Ser: 0.76 mg/dL (ref 0.44–1.00)
GFR calc Af Amer: >60 mL/min (ref >60)
GFR calc non Af Amer: >60 mL/min (ref >60)
Glucose, Bld: 94 mg/dL (ref 70–99)
Potassium: 3.8 mmol/L (ref 3.5–5.1)
Sodium: 137 mmol/L (ref 135–145)

## 2019-09-22 LAB — CBC
HCT: 36.7 % (ref 36.0–46.0)
Hemoglobin: 10.6 g/dL — ABNORMAL LOW (ref 12.0–15.0)
MCH: 20.8 pg — ABNORMAL LOW (ref 26.0–34.0)
MCHC: 28.9 g/dL — ABNORMAL LOW (ref 30.0–36.0)
MCV: 72 fL — ABNORMAL LOW (ref 80.0–100.0)
Platelets: 374 10*3/uL (ref 150–400)
RBC: 5.1 MIL/uL (ref 3.87–5.11)
RDW: 18.7 % — ABNORMAL HIGH (ref 11.5–15.5)
WBC: 11.7 10*3/uL — ABNORMAL HIGH (ref 4.0–10.5)
nRBC: 0 % (ref 0.0–0.2)

## 2019-09-22 LAB — TROPONIN I (HIGH SENSITIVITY)
Troponin I (High Sensitivity): <2 ng/L (ref <18)
Troponin I (High Sensitivity): <2 ng/L (ref <18)

## 2019-09-22 LAB — D-DIMER, QUANTITATIVE: D-Dimer, Quant: 0.47 ug/mL-FEU (ref 0.00–0.50)

## 2019-09-22 MED ORDER — IBUPROFEN 800 MG PO TABS
800.0000 mg | ORAL_TABLET | Freq: Three times a day (TID) | ORAL | 0 refills | Status: DC
Start: 2019-09-22 — End: 2020-10-07

## 2019-09-22 MED ORDER — IBUPROFEN 800 MG PO TABS
800.0000 mg | ORAL_TABLET | Freq: Once | ORAL | Status: AC
  Administered 2019-09-22: 800 mg via ORAL
  Filled 2019-09-22: qty 1

## 2019-09-22 NOTE — ED Triage Notes (Signed)
Pt to er, pt states that a few days ago she started having some chest pain, states that the pain would come and go.  States that her friend who is an emt did an ekg of her and he said it was ok, states that he also listened to her lungs.  States that she is feeling more sob today.  Pt has a dry cough.

## 2019-09-22 NOTE — ED Notes (Signed)
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

## 2019-09-22 NOTE — ED Provider Notes (Signed)
[facility] EMERGENCY DEPARTMENT Provider Note   CSN: [id number] Arrival date & time: 09/22/19  1856     History Chief Complaint  Patient presents with  . Chest Pain    Paula Myers is a 34 y.o. female presenting for evaluation of chest pain which started 3 days ago and was initially intermittent but has become more constant.  She describes left-sided aching pain in addition to having shortness of breath which is also worsened today, and worsened with exertion.  She denies fevers or chills.  She has had a mild nonproductive cough, denies upper respiratory symptoms such as nasal congestion, rhinorrhea, postnasal drip.  Denies orthopnea.  She also denies peripheral edema or pain.  She was at the beach last weekend and so did have a prolonged car ride, also describes catching footballs on the beach, was hit in the chest several times with the football but denies any reproducible pain with chest palpation.  She is not on oral contraceptives.  She denies palpitations, nausea or vomiting, denies myalgias or other flulike symptoms.  She had COVID-19 in December, has not been vaccinated since, but denies symptoms are reminiscent of this infection.  Past medical history is significant for SVT and occasional PVCs.  She has had no treatments and is found no alleviators for her symptoms.  The history is provided by the patient.       Past Medical History:  Diagnosis Date  . Anemia    prior pregnancies  . Anxiety   . Depression   . Dysrhythmia    "skipping" HR at beginning of pregnancy  . H/O cervical polypectomy   . Heartburn during pregnancy   . Hypertension 2005   while in labor  . Kidney stone   . Palpitation   . SVT (supraventricular tachycardia) (HCC)   . Tachycardia     Patient Active Problem List   Diagnosis Date Noted  . Bacterial vaginosis 07/31/2016  . Delivery of pregnancy by cesarean section 07/31/2016  . Failure of lactation 07/31/2016  . Cervical high risk HPV  (human papillomavirus) test positive 07/31/2016  . Morning sickness 07/31/2016  . Depression 10/02/2015  . Breast lump 10/02/2015  . Stress at home 10/02/2015  . BMI 40.0-44.9, adult (HCC) 10/02/2015  . History of cesarean section 08/22/2014  . Heart palpitations 01/15/2014  . Ventricular premature beats 01/15/2014  . Hypokalemia 01/15/2014  . SVT (supraventricular tachycardia) (HCC)     Past Surgical History:  Procedure Laterality Date  . CESAREAN SECTION    . CESAREAN SECTION WITH BILATERAL TUBAL LIGATION Bilateral 08/22/2014   Procedure: CESAREAN SECTION WITH BILATERAL TUBAL LIGATION;  Surgeon: Essie Hart, MD;  Location: [facility];  Service: Obstetrics;  Laterality: Bilateral;  . ELBOW SURGERY     Left     OB History    Gravida  4   Para  3   Term  3   Preterm      AB  1   Living  1     SAB  1   TAB      Ectopic      Multiple  0   Live Births  1           Family History  Problem Relation Age of Onset  . Hypertension Maternal Grandmother   . Heart disease Maternal Grandmother     Social History   Tobacco Use  . Smoking status: Never Smoker  . Smokeless tobacco: Never Used  Vaping Use  . Vaping  Use: Every day  Substance Use Topics  . Alcohol use: No  . Drug use: No    Home Medications Prior to Admission medications   Medication Sig Start Date End Date Taking? Authorizing Provider  busPIRone (BUSPAR) 10 MG tablet Take 1 tablet (10 mg total) by mouth 2 (two) times daily. Patient not taking: Reported on 01/26/2019 09/30/16   Johna Sheriff, MD  ferrous sulfate 325 (65 FE) MG tablet Take 1 tablet (325 mg total) by mouth daily. 03/17/18   Maxwell Caul, PA-C  ibuprofen (ADVIL) 800 MG tablet Take 1 tablet (800 mg total) by mouth 3 (three) times daily. 09/22/19   Burgess Amor, PA-C  lamoTRIgine (LAMICTAL) 100 MG tablet Take 100mg  daily Patient not taking: Reported on 01/26/2019 08/28/16   08/30/16, MD  methocarbamol (ROBAXIN) 500 MG tablet  Take 1 tablet (500 mg total) by mouth 2 (two) times daily. Patient not taking: Reported on 01/26/2019 03/17/18   03/19/18, PA-C    Allergies    Levaquin [levofloxacin in d5w], Levofloxacin, and Pertussis vaccines  Review of Systems   Review of Systems  Constitutional: Negative for chills and fever.  HENT: Negative for congestion, postnasal drip and sore throat.   Eyes: Negative.   Respiratory: Positive for cough and shortness of breath. Negative for chest tightness and wheezing.   Cardiovascular: Positive for chest pain. Negative for palpitations and leg swelling.  Gastrointestinal: Negative for abdominal pain, nausea and vomiting.  Genitourinary: Negative.   Musculoskeletal: Negative for arthralgias, joint swelling and neck pain.  Skin: Negative.  Negative for rash and wound.  Neurological: Negative for dizziness, weakness, light-headedness, numbness and headaches.  Psychiatric/Behavioral: Negative.     Physical Exam Updated Vital Signs BP 117/70   Pulse 83   Temp 98.2 F (36.8 C) (Oral)   Resp 20   Ht 5\' 5"  (1.651 m)   Wt (!) 97.5 kg   SpO2 100%   Breastfeeding No   BMI 35.78 kg/m   Physical Exam Vitals and nursing note reviewed.  Constitutional:      Appearance: She is well-developed.  HENT:     Head: Normocephalic and atraumatic.  Eyes:     Conjunctiva/sclera: Conjunctivae normal.  Cardiovascular:     Rate and Rhythm: Normal rate and regular rhythm.     Heart sounds: Normal heart sounds.  Pulmonary:     Effort: Pulmonary effort is normal.     Breath sounds: Normal breath sounds. No decreased breath sounds, wheezing, rhonchi or rales.  Chest:     Chest wall: No mass, tenderness or edema.  Abdominal:     General: Bowel sounds are normal.     Palpations: Abdomen is soft.     Tenderness: There is no abdominal tenderness.  Musculoskeletal:        General: Normal range of motion.     Cervical back: Normal range of motion.     Right lower leg: No  tenderness. No edema.     Left lower leg: No tenderness. No edema.  Skin:    General: Skin is warm and dry.  Neurological:     Mental Status: She is alert.     ED Results / Procedures / Treatments   Labs (all labs ordered are listed, but only abnormal results are displayed) Labs Reviewed  CBC - Abnormal; Notable for the following components:      Result Value   WBC 11.7 (*)    Hemoglobin 10.6 (*)    MCV  72.0 (*)    MCH 20.8 (*)    MCHC 28.9 (*)    RDW 18.7 (*)    All other components within normal limits  BASIC METABOLIC PANEL  D-DIMER, QUANTITATIVE (NOT AT [facility])  POC URINE PREG, ED  TROPONIN I (HIGH SENSITIVITY)  TROPONIN I (HIGH SENSITIVITY)    EKG EKG Interpretation  Date/Time:  Friday September 22 2019 19:12:08 EDT Ventricular Rate:  88 PR Interval:  126 QRS Duration: 80 QT Interval:  364 QTC Calculation: 440 R Axis:   60 Text Interpretation: Normal sinus rhythm Normal ECG since last tracing no significant change Confirmed by Eber Hong [phone]) on 09/22/2019 7:20:46 PM   Radiology DG Chest 2 View  Result Date: 09/22/2019 CLINICAL DATA:  Intermittent chest pain for several days, short of breath, dry cough EXAM: CHEST - 2 VIEW COMPARISON:  01/05/2019 FINDINGS: The heart size and mediastinal contours are within normal limits. Both lungs are clear. The visualized skeletal structures are unremarkable. IMPRESSION: No active cardiopulmonary disease. Electronically Signed   By: Sharlet Salina M.D.   On: 09/22/2019 19:28    Procedures Procedures (including critical care time)  Medications Ordered in ED Medications  ibuprofen (ADVIL) tablet 800 mg (800 mg Oral Given 09/22/19 2221)    ED Course  I have reviewed the triage vital signs and the nursing notes.  Pertinent labs & imaging results that were available during my care of the patient were reviewed by me and considered in my medical decision making (see chart for details).    MDM Rules/Calculators/A&P                           Patient with left-sided aching chest pain with exertional shortness of breath, no pleuritic component, no tachycardia. Her vital signs here are stable. She had a recent trip to the beach, therefore D-dimer was completed to rule out pulmonary embolism and this test was negative. She is otherwise PERC negative. 2 sets of troponins are negative, EKG normal, no family history or personal risks for CAD, doubt this represents unstable angina., chest x-ray also normal with no sign of pneumonia. She is afebrile, had COVID-19 previously, denies any symptoms similar to today's, doubt this is Covid. She does endorse multiple hits to her chest with a football while she was at the beach, this may represent a deep chest wall strain or contusion, although pain is not reproducible on exam. She was placed on ibuprofen, discussed heat therapy, also discussed close follow-up with her PCP for recheck if symptoms are not improving, return precautions also discussed. Final Clinical Impression(s) / ED Diagnoses Final diagnoses:  Chest pain, unspecified type    Rx / DC Orders ED Discharge Orders         Ordered    ibuprofen (ADVIL) 800 MG tablet  3 times daily     Discontinue  Reprint     09/22/19 2244           Burgess Amor, PA-C 09/22/19 2257    Eber Hong, MD 09/23/19 [phone]

## 2019-09-22 NOTE — Discharge Instructions (Signed)
All of your lab tests tonight are normal including blood test that help to rule out cardiac source of your pain, a D-dimer test is normal range eliminating the possibility of a blood clot.  Your chest x-ray is clear with no pneumonia or bronchitis.  You have been prescribed an anti-inflammatory for suspected chest wall pain.  You may also use a heating pad 20 minutes several times daily which may help improve your symptoms.  Get rechecked by your primary doctor if your symptoms persist, worsen or not resolved in the next week.

## 2019-09-22 NOTE — ED Notes (Signed)
Call to lab  Re: D Dimer

## 2020-09-06 ENCOUNTER — Ambulatory Visit: Payer: Commercial Managed Care - PPO | Admitting: Internal Medicine

## 2020-10-04 ENCOUNTER — Ambulatory Visit: Payer: Commercial Managed Care - PPO | Admitting: Internal Medicine

## 2020-10-07 ENCOUNTER — Other Ambulatory Visit: Payer: Self-pay

## 2020-10-07 ENCOUNTER — Ambulatory Visit (INDEPENDENT_AMBULATORY_CARE_PROVIDER_SITE_OTHER): Payer: Commercial Managed Care - PPO | Admitting: Nurse Practitioner

## 2020-10-07 ENCOUNTER — Encounter: Payer: Self-pay | Admitting: Nurse Practitioner

## 2020-10-07 VITALS — BP 131/82 | HR 76 | Temp 97.6°F | Ht 65.0 in | Wt 235.0 lb

## 2020-10-07 DIAGNOSIS — O99345 Other mental disorders complicating the puerperium: Secondary | ICD-10-CM

## 2020-10-07 DIAGNOSIS — Z0001 Encounter for general adult medical examination with abnormal findings: Secondary | ICD-10-CM | POA: Insufficient documentation

## 2020-10-07 DIAGNOSIS — Z139 Encounter for screening, unspecified: Secondary | ICD-10-CM | POA: Diagnosis not present

## 2020-10-07 DIAGNOSIS — D509 Iron deficiency anemia, unspecified: Secondary | ICD-10-CM | POA: Insufficient documentation

## 2020-10-07 DIAGNOSIS — Z7689 Persons encountering health services in other specified circumstances: Secondary | ICD-10-CM

## 2020-10-07 DIAGNOSIS — F53 Postpartum depression: Secondary | ICD-10-CM

## 2020-10-07 DIAGNOSIS — S0120XA Unspecified open wound of nose, initial encounter: Secondary | ICD-10-CM | POA: Diagnosis not present

## 2020-10-07 DIAGNOSIS — E0590 Thyrotoxicosis, unspecified without thyrotoxic crisis or storm: Secondary | ICD-10-CM | POA: Insufficient documentation

## 2020-10-07 HISTORY — DX: Encounter for general adult medical examination with abnormal findings: Z00.01

## 2020-10-07 HISTORY — DX: Unspecified open wound of nose, initial encounter: S01.20XA

## 2020-10-07 HISTORY — DX: Encounter for screening, unspecified: Z13.9

## 2020-10-07 MED ORDER — FERROUS SULFATE 325 (65 FE) MG PO TABS: 325.0000 mg | ORAL_TABLET | Freq: Every day | ORAL | 1 refills | Status: DC

## 2020-10-07 NOTE — Assessment & Plan Note (Signed)
-  no issues today -had psotpartum issues; no longer on Lamictal or buspar

## 2020-10-07 NOTE — Assessment & Plan Note (Signed)
Non-healing wound: ? Basal cell -referral to derm

## 2020-10-07 NOTE — Assessment & Plan Note (Signed)
-  obtain records 

## 2020-10-07 NOTE — Patient Instructions (Signed)
Please get thyroid labs drawn today.  For your next appointment, please have fasting labs drawn 2-3 days prior to your appointment so we can discuss the results during your office visit.

## 2020-10-07 NOTE — Assessment & Plan Note (Signed)
-  will screen for HCV with next set of labs 

## 2020-10-07 NOTE — Assessment & Plan Note (Signed)
-  refilled iron supplement -recheck labs in 1 month

## 2020-10-07 NOTE — Assessment & Plan Note (Signed)
-  TSH was < 0.005 on labs 09/18/20 -urgent endo referral

## 2020-10-07 NOTE — Progress Notes (Signed)
New Patient Office Visit  Subjective:  Patient ID: Paula Myers, female    DOB: 11-Aug-1985  Age: 35 y.o. MRN: [id number]  CC:  Chief Complaint  Patient presents with   New Patient (Initial Visit)    Here to establish care. Has a sore on nose that has been there for about 6 months, scabs over and then comes right back. Also had labs recently and has newly hyperthyroidism and anemic.     HPI Paula Myers presents for new patient visit. Transferring care from from Dr. Satira Mccallum with [facility]. No recent physical. She had labs drawn 09/18/20.  She is concerned with a non-healing sore on her nose that has been present for about 6 months.  She states she had hyperthyroidism and anemia on recent labs. She states she noticed a lump in her throat.  Past Medical History:  Diagnosis Date   Anemia    prior pregnancies   Anxiety    Depression    Dysrhythmia    "skipping" HR at beginning of pregnancy   H/O cervical polypectomy    Heartburn during pregnancy    History of cesarean section 08/22/2014   Hypertension 2005   while in labor   Kidney stone    Palpitation    SVT (supraventricular tachycardia) (HCC)    SVT (supraventricular tachycardia) (HCC)    Tachycardia     Past Surgical History:  Procedure Laterality Date   CESAREAN SECTION     CESAREAN SECTION WITH BILATERAL TUBAL LIGATION Bilateral 08/22/2014   Procedure: CESAREAN SECTION WITH BILATERAL TUBAL LIGATION;  Surgeon: Sanjuana Kava, MD;  Location: [facility];  Service: Obstetrics;  Laterality: Bilateral;   ELBOW SURGERY     Left    Family History  Problem Relation Age of Onset   Hypertension Mother    Diabetes Mother    Diabetes Brother    Diabetes Maternal Grandmother    Hypertension Maternal Grandmother    Heart disease Maternal Grandmother    Cancer Paternal Grandmother    Cancer Paternal Grandfather     Social History   Socioeconomic History   Marital status: Married    Spouse name: Not on  file   Number of children: 3   Years of education: Not on file   Highest education level: Not on file  Occupational History   Occupation: [employer]    Comment: Dispatcher  Tobacco Use   Smoking status: Every Day    Packs/day: 0.25    Types: Cigarettes   Smokeless tobacco: Never  Vaping Use   Vaping Use: Former  Substance and Sexual Activity   Alcohol use: Not Currently    Comment: maybe 1-2 per year   Drug use: No   Sexual activity: Yes    Birth control/protection: Surgical  Other Topics Concern   Not on file  Social History Narrative   Not on file   Social Determinants of Health   Financial Resource Strain: Not on file  Food Insecurity: Not on file  Transportation Needs: Not on file  Physical Activity: Not on file  Stress: Not on file  Social Connections: Not on file  Intimate Partner Violence: Not on file    ROS Review of Systems  Constitutional: Negative.   Respiratory: Negative.    Cardiovascular: Negative.   Endocrine:       Recent labs with TSH <0.005  Skin:  Positive for wound.       To nose  Psychiatric/Behavioral: Negative.  Objective:   Today's Vitals: BP 131/82 (BP Location: Left Arm, Patient Position: Sitting, Cuff Size: Large)   Pulse 76   Temp 97.6 F (36.4 C) (Temporal)   Ht 5' 5"  (1.651 m)   Wt 235 lb (106.6 kg)   LMP 10/05/2020 (Exact Date)   SpO2 98%   BMI 39.11 kg/m   Physical Exam Constitutional:      Appearance: Normal appearance. She is obese.  Cardiovascular:     Rate and Rhythm: Normal rate and regular rhythm.     Pulses: Normal pulses.     Heart sounds: Normal heart sounds.  Pulmonary:     Effort: Pulmonary effort is normal.     Breath sounds: Normal breath sounds.  Skin:    Comments: Small wound to tip of nose; pearly  Neurological:     Mental Status: She is alert.  Psychiatric:        Mood and Affect: Mood normal.        Behavior: Behavior normal.        Thought Content: Thought content normal.         Judgment: Judgment normal.    Assessment & Plan:   Problem List Items Addressed This Visit       Endocrine   Hyperthyroidism    -TSH was < 0.005 on labs 09/18/20 -urgent endo referral       Relevant Orders   Ambulatory referral to Endocrinology   TSH+T4F+T3Free     Other   Depression    -no issues today -had psotpartum issues; no longer on Lamictal or buspar       Encounter to establish care - Primary    -obtain records        Relevant Orders   CBC with Differential/Platelet   CMP14+EGFR   Lipid Panel With LDL/HDL Ratio   Iron, TIBC and Ferritin Panel   Wound, open, nose    Non-healing wound: ? Basal cell -referral to derm       Relevant Orders   Ambulatory referral to Dermatology   Screening due    -will screen for HCV with next set of labs       Relevant Orders   Hepatitis C antibody   IDA (iron deficiency anemia)    -refilled iron supplement -recheck labs in 1 month       Relevant Medications   ferrous sulfate 325 (65 FE) MG tablet   Other Relevant Orders   Iron, TIBC and Ferritin Panel    Outpatient Encounter Medications as of 10/07/2020  Medication Sig   [DISCONTINUED] ferrous sulfate 325 (65 FE) MG tablet Take 1 tablet (325 mg total) by mouth daily.   [DISCONTINUED] ibuprofen (ADVIL) 800 MG tablet Take 1 tablet (800 mg total) by mouth 3 (three) times daily.   ferrous sulfate 325 (65 FE) MG tablet Take 1 tablet (325 mg total) by mouth daily.   [DISCONTINUED] busPIRone (BUSPAR) 10 MG tablet Take 1 tablet (10 mg total) by mouth 2 (two) times daily. (Patient not taking: Reported on 01/26/2019)   [DISCONTINUED] lamoTRIgine (LAMICTAL) 100 MG tablet Take 184m daily (Patient not taking: Reported on 01/26/2019)   [DISCONTINUED] methocarbamol (ROBAXIN) 500 MG tablet Take 1 tablet (500 mg total) by mouth 2 (two) times daily. (Patient not taking: Reported on 01/26/2019)   No facility-administered encounter medications on file as of 10/07/2020.     Follow-up: Return in about 1 month (around 11/07/2020) for Physical with PAP.   JNoreene Larsson NP

## 2020-10-08 LAB — TSH+T4F+T3FREE
Free T4: 1.84 ng/dL — ABNORMAL HIGH (ref 0.82–1.77)
T3, Free: 5.1 pg/mL — ABNORMAL HIGH (ref 2.0–4.4)
TSH: <0.005 u[IU]/mL — ABNORMAL LOW (ref 0.450–4.500)

## 2020-10-08 NOTE — Progress Notes (Signed)
Her thyroid hormone levels (T3 and T4) are elevated, so she should see endocrinology as soon as possible.

## 2020-10-09 NOTE — Patient Instructions (Signed)
Hyperthyroidism  Hyperthyroidism is when the thyroid gland is too active (overactive). The thyroid gland is a small gland located in the lower front part of the neck, just in front of the windpipe (trachea). This gland makes hormones that help control how the body uses food for energy (metabolism) as well as how the heart and brain function. These hormones also play a role in keeping your bones strong. When the thyroid is overactive, it produces toomuch of a hormone called thyroxine. What are the causes? This condition may be caused by: Graves' disease. This is a disorder in which the body's disease-fighting system (immune system) attacks the thyroid gland. This is the most common cause. Inflammation of the thyroid gland. A tumor in the thyroid gland. Use of certain medicines, including: Prescription thyroid hormone replacement. Herbal supplements that mimic thyroid hormones. Amiodarone therapy. Solid or fluid-filled lumps within your thyroid gland (thyroid nodules). Taking in a large amount of iodine from foods or medicines. What increases the risk? You are more likely to develop this condition if: You are female. You have a family history of thyroid conditions. You smoke tobacco. You use a medicine called lithium. You take medicines that affect the immune system (immunosuppressants). What are the signs or symptoms? Symptoms of this condition include: Nervousness. Inability to tolerate heat. Unexplained weight loss. Diarrhea. Change in the texture of hair or skin. Heart skipping beats or making extra beats. Rapid heart rate. Loss of menstruation. Shaky hands. Fatigue. Restlessness. Sleep problems. Enlarged thyroid gland or a lump in the thyroid (nodule). You may also have symptoms of Graves' disease, which may include: Protruding eyes. Dry eyes. Red or swollen eyes. Problems with vision. How is this diagnosed? This condition may be diagnosed based on: Your symptoms and  medical history. A physical exam. Blood tests. Thyroid ultrasound. This test involves using sound waves to produce images of the thyroid gland. A thyroid scan. A radioactive substance is injected into a vein, and images show how much iodine is present in the thyroid. Radioactive iodine uptake test (RAIU). A small amount of radioactive iodine is given by mouth to see how much iodine the thyroid absorbs after a certain amount of time. How is this treated? Treatment depends on the cause and severity of the condition. Treatment may include: Medicines to reduce the amount of thyroid hormone your body makes. Radioactive iodine treatment (radioiodine therapy). This involves swallowing a small dose of radioactive iodine, in capsule or liquid form, to kill thyroid cells. Surgery to remove part or all of your thyroid gland. You may need to take thyroid hormone replacement medicine for the rest of your life after thyroid surgery. Medicines to help manage your symptoms. Follow these instructions at home:  Take over-the-counter and prescription medicines only as told by your health care provider. Do not use any products that contain nicotine or tobacco, such as cigarettes and e-cigarettes. If you need help quitting, ask your health care provider. Follow any instructions from your health care provider about diet. You may be instructed to limit foods that contain iodine. Keep all follow-up visits as told by your health care provider. This is important. You will need to have blood tests regularly so that your health care provider can monitor your condition. Contact a health care provider if: Your symptoms do not get better with treatment. You have a fever. You are taking thyroid hormone replacement medicine and you: Have symptoms of depression. Feel like you are tired all the time. Gain weight. Get help right   away if: You have chest pain. You have decreased alertness or a change in your awareness. You  have abdominal pain. You feel dizzy. You have a rapid heartbeat. You have an irregular heartbeat. You have difficulty breathing. Summary The thyroid gland is a small gland located in the lower front part of the neck, just in front of the windpipe (trachea). Hyperthyroidism is when the thyroid gland is too active (overactive) and produces too much of a hormone called thyroxine. The most common cause is Graves' disease, a disorder in which your immune system attacks the thyroid gland. Hyperthyroidism can cause various symptoms, such as unexplained weight loss, nervousness, inability to tolerate heat, or changes in your heartbeat. Treatment may include medicine to reduce the amount of thyroid hormone your body makes, radioiodine therapy, surgery, or medicines to manage symptoms. This information is not intended to replace advice given to you by your health care provider. Make sure you discuss any questions you have with your healthcare provider. Document Revised: 11/02/2019 Document Reviewed: 11/02/2019 Elsevier Patient Education  2022 Elsevier Inc.  

## 2020-10-10 ENCOUNTER — Other Ambulatory Visit: Payer: Self-pay

## 2020-10-10 ENCOUNTER — Ambulatory Visit (INDEPENDENT_AMBULATORY_CARE_PROVIDER_SITE_OTHER): Payer: Commercial Managed Care - PPO | Admitting: Nurse Practitioner

## 2020-10-10 ENCOUNTER — Encounter: Payer: Self-pay | Admitting: Nurse Practitioner

## 2020-10-10 VITALS — BP 130/83 | HR 84 | Ht 65.0 in | Wt 236.0 lb

## 2020-10-10 DIAGNOSIS — E0590 Thyrotoxicosis, unspecified without thyrotoxic crisis or storm: Secondary | ICD-10-CM

## 2020-10-10 NOTE — Progress Notes (Signed)
10/10/2020     Endocrinology Consult Note    Subjective:    Patient ID: Paula Myers, female    DOB: September 30, 1985, PCP Noreene Larsson, NP.   Past Medical History:  Diagnosis Date   Anemia    prior pregnancies   Anxiety    Depression    Dysrhythmia    "skipping" HR at beginning of pregnancy   H/O cervical polypectomy    Heartburn during pregnancy    History of cesarean section 08/22/2014   Hypertension 2005   while in labor   Kidney stone    Palpitation    SVT (supraventricular tachycardia) (HCC)    SVT (supraventricular tachycardia) (HCC)    Tachycardia     Past Surgical History:  Procedure Laterality Date   CESAREAN SECTION     CESAREAN SECTION WITH BILATERAL TUBAL LIGATION Bilateral 08/22/2014   Procedure: CESAREAN SECTION WITH BILATERAL TUBAL LIGATION;  Surgeon: Sanjuana Kava, MD;  Location: [facility];  Service: Obstetrics;  Laterality: Bilateral;   ELBOW SURGERY     Left    Social History   Socioeconomic History   Marital status: Married    Spouse name: Not on file   Number of children: 3   Years of education: Not on file   Highest education level: Not on file  Occupational History   Occupation: [employer]    Comment: Dispatcher  Tobacco Use   Smoking status: Every Day    Packs/day: 0.25    Types: Cigarettes   Smokeless tobacco: Never  Vaping Use   Vaping Use: Former  Substance and Sexual Activity   Alcohol use: Not Currently    Comment: maybe 1-2 per year   Drug use: No   Sexual activity: Yes    Birth control/protection: Surgical  Other Topics Concern   Not on file  Social History Narrative   Not on file   Social Determinants of Health   Financial Resource Strain: Not on file  Food Insecurity: Not on file  Transportation Needs: Not on file  Physical Activity: Not on file  Stress: Not on file  Social Connections: Not on file    Family History  Problem Relation Age of Onset   Hypertension Mother    Diabetes  Mother    Diabetes Brother    Diabetes Maternal Grandmother    Hypertension Maternal Grandmother    Heart disease Maternal Grandmother    Cancer Paternal Grandmother    Cancer Paternal Grandfather     Outpatient Encounter Medications as of 10/10/2020  Medication Sig   ferrous sulfate 325 (65 FE) MG tablet Take 1 tablet (325 mg total) by mouth daily.   No facility-administered encounter medications on file as of 10/10/2020.    ALLERGIES: Allergies  Allergen Reactions   Levaquin [Levofloxacin In D5w] Anaphylaxis   Levofloxacin Anaphylaxis   Pertussis Vaccines Swelling    T-Dap injection caused localized swelling of arm. Has had Tetanus before without problems.   Tetanus-Diphth-Acell Pertussis Swelling    VACCINATION STATUS: Immunization History  Administered Date(s) Administered   DTaP 09/09/1983, 11/18/1983, 02/03/1984, 01/25/1985, 10/29/1988   Hepatitis B 12/09/1994, 01/14/1995, 06/16/1995   IPV 09/09/1983, 11/18/1983, 02/03/1984, 01/25/1985   Influenza-Unspecified 12/18/2002, 01/23/2014   MMR 10/28/1984, 12/26/2004   Moderna Sars-Covid-2 Vaccination 12/19/2019, 01/16/2020   Tdap 12/26/2004     HPI  Paula Myers is 35 y.o. female who presents today with a medical history as above. she is being seen in consultation for hyperthyroidism requested by  Noreene Larsson, NP.  she has been dealing with symptoms of anxiety, irritability, insomnia, tremors, palpitations, hair loss for "a few months." These symptoms are progressively worsening and troubling to her.  her most recent thyroid labs revealed suppressed TSH of < 0.005 and elevated FT4 of 1.84 and elevated FT3 of 5.1 on 10/07/20. she denies dysphagia, choking, shortness of breath, no recent voice change.    she does family history of thyroid dysfunction in her grandmother (Graves disease) and mother (MNG requiring thyroidectomy), but denies family hx of thyroid cancer. she denies personal history of goiter. she is not on  any anti-thyroid medications nor on any thyroid hormone supplements. She does intermittently use of Biotin containing supplements but has not taken it in a few weeks.  she is willing to proceed with appropriate work up and therapy for thyrotoxicosis.   Review of systems  Constitutional: + Minimally fluctuating body weight, current Body mass index is 39.27 kg/m., no fatigue, + subjective hyperthermia, no subjective hypothermia Eyes: no blurry vision, no xerophthalmia ENT: no sore throat, no nodules palpated in throat, no dysphagia/odynophagia, no hoarseness Cardiovascular: no chest pain, no shortness of breath, + intermittent palpitations, no leg swelling Respiratory: no cough, no shortness of breath Gastrointestinal: no nausea/vomiting/diarrhea Musculoskeletal: no muscle/joint aches Skin: no rashes, no hyperemia Neurological: + tremors, no numbness, no tingling, no dizziness Psychiatric: no depression, + anxiety   Objective:    BP 130/83   Pulse 84   Ht 5' 5"  (1.651 m)   Wt 236 lb (107 kg)   LMP 10/05/2020 (Exact Date)   BMI 39.27 kg/m   Wt Readings from Last 3 Encounters:  10/10/20 236 lb (107 kg)  10/07/20 235 lb (106.6 kg)  09/22/19 (!) 215 lb (97.5 kg)     BP Readings from Last 3 Encounters:  10/10/20 130/83  10/07/20 131/82  09/22/19 117/70                        Physical Exam- Limited  Constitutional:  Body mass index is 39.27 kg/m. , not in acute distress, normal state of mind Eyes:  EOMI, no exophthalmos Neck: Supple Thyroid: No gross goiter, no palpable nodules Cardiovascular: RRR, no murmurs, rubs, or gallops, no edema Respiratory: Adequate breathing efforts, no crackles, rales, rhonchi, or wheezing Musculoskeletal: no gross deformities, strength intact in all four extremities, no gross restriction of joint movements Skin:  no rashes, no hyperemia Neurological: ++ tremor with outstretched hands, DTR normal BLE   CMP     Component Value Date/Time   NA  137 09/22/2019 1938   K 3.8 09/22/2019 1938   CL 107 09/22/2019 1938   CO2 22 09/22/2019 1938   GLUCOSE 94 09/22/2019 1938   BUN 11 09/22/2019 1938   CREATININE 0.76 09/22/2019 1938   CALCIUM 9.0 09/22/2019 1938   PROT 7.1 01/26/2019 1258   ALBUMIN 3.7 01/26/2019 1258   AST 18 01/26/2019 1258   ALT 21 01/26/2019 1258   ALKPHOS 59 01/26/2019 1258   BILITOT 0.4 01/26/2019 1258   GFRNONAA >60 09/22/2019 1938   GFRAA >60 09/22/2019 1938     CBC    Component Value Date/Time   WBC 11.7 (H) 09/22/2019 1938   RBC 5.10 09/22/2019 1938   HGB 10.6 (L) 09/22/2019 1938   HGB 10.5 (L) 07/31/2016 0932   HCT 36.7 09/22/2019 1938   HCT 34.6 07/31/2016 0932   PLT 374 09/22/2019 1938   PLT 398 (H) 07/31/2016 0932  MCV 72.0 (L) 09/22/2019 1938   MCV 71 (L) 07/31/2016 0932   MCH 20.8 (L) 09/22/2019 1938   MCHC 28.9 (L) 09/22/2019 1938   RDW 18.7 (H) 09/22/2019 1938   RDW 17.7 (H) 07/31/2016 0932   LYMPHSABS 3.4 01/05/2019 0802   LYMPHSABS 2.4 07/31/2016 0932   MONOABS 1.0 01/05/2019 0802   EOSABS 0.2 01/05/2019 0802   EOSABS 0.2 07/31/2016 0932   BASOSABS 0.1 01/05/2019 0802   BASOSABS 0.1 07/31/2016 0932     Diabetic Labs (most recent): No results found for: HGBA1C  Lipid Panel  No results found for: CHOL, TRIG, HDL, CHOLHDL, VLDL, LDLCALC, LDLDIRECT, LABVLDL   Lab Results  Component Value Date   TSH <0.005 (L) 10/07/2020   FREET4 1.84 (H) 10/07/2020        Assessment & Plan:   1. 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  her history and most recent labs are reviewed, and she was examined clinically. Subjective and objective findings are consistent with thyrotoxicosis likely from primary hyperthyroidism. The potential risks of untreated thyrotoxicosis and the need for definitive therapy have been discussed in detail with her, and she agrees to proceed with diagnostic workup and treatment plan.    Confirmatory thyroid uptake and scan will be scheduled to be done as soon as possible.  Will recheck labs in 4-6 weeks, adding on antibody testing to evaluate for autoimmune thyroid dysfunction.   Options of therapy are discussed with her.  We discussed the option of treating it with medications including methimazole or PTU which may have side effects including rash, transaminitis, and bone marrow suppression.  We also discussed the option of definitive therapy with RAI ablation of the thyroid. If she is found to have primary hyperthyroidism from Graves' disease , toxic multinodular goiter or toxic nodular goiter the preferred modality of treatment would be I-131 thyroid ablation. Surgery is another choice of treatment in some cases, in her case surgery is not a good fit for presentation with no obvious goiter.  -Patient is made aware of the high likelihood of post ablative hypothyroidism with subsequent need for lifelong thyroid hormone replacement. sheunderstands this outcome and she is  willing to proceed.      she will return in 3 weeks for treatment decision.  I did not initiate any new prescriptions at today's visit.    -Patient is advised to maintain close follow up with Noreene Larsson, NP for primary care needs.   - Time spent with the patient: 60 minutes, of which >50% was spent in obtaining information about her symptoms, reviewing her previous labs, evaluations, and treatments, counseling her about her hyperthyroidism , and developing a plan to confirm the diagnosis and long term treatment as necessary. Please refer to "Patient Self Inventory" in the Media tab for reviewed elements of pertinent patient history.  Glenna Fellows participated in the discussions, expressed understanding, and voiced agreement with the above plans.  All questions were answered to her satisfaction. she is encouraged to contact clinic should she have any questions or concerns prior to her return visit.      Follow up plan: Return in about 3 weeks (around 10/31/2020) for Thyroid follow up; uptake and scan.   Thank you for involving me in the care of this pleasant patient, and I will continue to update you with her progress.    Rayetta Pigg, FNP-BC [facility] [address]  [place], [place] [address] Phone: [phone] Fax: [phone]  10/10/2020, 9:46 AM

## 2020-10-16 ENCOUNTER — Encounter (HOSPITAL_COMMUNITY)
Admission: RE | Admit: 2020-10-16 | Discharge: 2020-10-16 | Disposition: A | Discharge status: Home / Self Care | Payer: Commercial Managed Care - PPO | Source: Ambulatory Visit | Attending: Nurse Practitioner | Admitting: Nurse Practitioner

## 2020-10-16 ENCOUNTER — Other Ambulatory Visit: Payer: Self-pay

## 2020-10-16 DIAGNOSIS — E0590 Thyrotoxicosis, unspecified without thyrotoxic crisis or storm: Secondary | ICD-10-CM | POA: Diagnosis not present

## 2020-10-16 MED ORDER — SODIUM IODIDE I-123 7.4 MBQ CAPS
436.0000 | ORAL_CAPSULE | Freq: Once | ORAL | Status: AC
  Administered 2020-10-16: 436 via ORAL

## 2020-10-17 ENCOUNTER — Encounter (HOSPITAL_COMMUNITY)
Admission: RE | Admit: 2020-10-17 | Discharge: 2020-10-17 | Disposition: A | Discharge status: Home / Self Care | Payer: Commercial Managed Care - PPO | Source: Ambulatory Visit | Attending: Nurse Practitioner | Admitting: Nurse Practitioner

## 2020-10-27 ENCOUNTER — Emergency Department (HOSPITAL_COMMUNITY): Payer: Commercial Managed Care - PPO

## 2020-10-27 ENCOUNTER — Other Ambulatory Visit: Payer: Self-pay

## 2020-10-27 ENCOUNTER — Emergency Department (HOSPITAL_COMMUNITY)
Admission: EM | Admit: 2020-10-27 | Discharge: 2020-10-27 | Disposition: A | Discharge status: Home / Self Care | Payer: Commercial Managed Care - PPO | Attending: Emergency Medicine | Admitting: Emergency Medicine

## 2020-10-27 ENCOUNTER — Encounter (HOSPITAL_COMMUNITY): Payer: Self-pay | Admitting: Emergency Medicine

## 2020-10-27 DIAGNOSIS — I10 Essential (primary) hypertension: Secondary | ICD-10-CM | POA: Insufficient documentation

## 2020-10-27 DIAGNOSIS — M436 Torticollis: Secondary | ICD-10-CM | POA: Insufficient documentation

## 2020-10-27 DIAGNOSIS — F17210 Nicotine dependence, cigarettes, uncomplicated: Secondary | ICD-10-CM | POA: Insufficient documentation

## 2020-10-27 DIAGNOSIS — R519 Headache, unspecified: Secondary | ICD-10-CM | POA: Diagnosis not present

## 2020-10-27 DIAGNOSIS — R509 Fever, unspecified: Secondary | ICD-10-CM | POA: Insufficient documentation

## 2020-10-27 DIAGNOSIS — N9489 Other specified conditions associated with female genital organs and menstrual cycle: Secondary | ICD-10-CM | POA: Diagnosis not present

## 2020-10-27 DIAGNOSIS — M542 Cervicalgia: Secondary | ICD-10-CM

## 2020-10-27 DIAGNOSIS — I639 Cerebral infarction, unspecified: Secondary | ICD-10-CM | POA: Insufficient documentation

## 2020-10-27 LAB — CBC
HCT: 40.1 % (ref 36.0–46.0)
Hemoglobin: 12.3 g/dL (ref 12.0–15.0)
MCH: 23.2 pg — ABNORMAL LOW (ref 26.0–34.0)
MCHC: 30.7 g/dL (ref 30.0–36.0)
MCV: 75.7 fL — ABNORMAL LOW (ref 80.0–100.0)
Platelets: 367 10*3/uL (ref 150–400)
RBC: 5.3 MIL/uL — ABNORMAL HIGH (ref 3.87–5.11)
RDW: 21.2 % — ABNORMAL HIGH (ref 11.5–15.5)
WBC: 11.9 10*3/uL — ABNORMAL HIGH (ref 4.0–10.5)
nRBC: 0 % (ref 0.0–0.2)

## 2020-10-27 LAB — COMPREHENSIVE METABOLIC PANEL
ALT: 25 U/L (ref 0–44)
AST: 17 U/L (ref 15–41)
Albumin: 4 g/dL (ref 3.5–5.0)
Alkaline Phosphatase: 75 U/L (ref 38–126)
Anion gap: 6 (ref 5–15)
BUN: 9 mg/dL (ref 6–20)
CO2: 24 mmol/L (ref 22–32)
Calcium: 9 mg/dL (ref 8.9–10.3)
Chloride: 107 mmol/L (ref 98–111)
Creatinine, Ser: 0.58 mg/dL (ref 0.44–1.00)
GFR, Estimated: >60 mL/min (ref >60)
Glucose, Bld: 90 mg/dL (ref 70–99)
Potassium: 4.3 mmol/L (ref 3.5–5.1)
Sodium: 137 mmol/L (ref 135–145)
Total Bilirubin: 0.3 mg/dL (ref 0.3–1.2)
Total Protein: 7.9 g/dL (ref 6.5–8.1)

## 2020-10-27 LAB — APTT: aPTT: 25 seconds (ref 24–36)

## 2020-10-27 LAB — HCG, QUANTITATIVE, PREGNANCY: hCG, Beta Chain, Quant, S: <1 m[IU]/mL (ref <5)

## 2020-10-27 LAB — PROTIME-INR
INR: 1.1 (ref 0.8–1.2)
Prothrombin Time: 13.8 seconds (ref 11.4–15.2)

## 2020-10-27 MED ORDER — OXYCODONE HCL 5 MG PO TABS: 5.0000 mg | ORAL_TABLET | ORAL | 0 refills | Status: DC | PRN

## 2020-10-27 MED ORDER — LIDOCAINE 5 % EX PTCH
1.0000 | MEDICATED_PATCH | CUTANEOUS | Status: DC
  Administered 2020-10-27: 1 via TRANSDERMAL
  Filled 2020-10-27: qty 1

## 2020-10-27 MED ORDER — METHOCARBAMOL 500 MG PO TABS
500.0000 mg | ORAL_TABLET | Freq: Once | ORAL | Status: AC
  Administered 2020-10-27: 500 mg via ORAL
  Filled 2020-10-27: qty 1

## 2020-10-27 NOTE — ED Provider Notes (Addendum)
[facility] EMERGENCY DEPARTMENT Provider Note   CSN: [id number] Arrival date & time: 10/27/20  0630     History Chief Complaint  Patient presents with   Neck Pain    Paula Myers is a 35 y.o. female.  Pt is a 35 yo female presenting for neck pain. Patient describes midline and bilateral paraspinal neck pain that developed upon wakening 3 days ago and is described as dull, aching, stiff, without radiation, constant, and worsening. Associated symptoms include headache and subjective fevers. Admits to covid 19 virus dx three weeks ago with originally improvement of symptoms. Denies any recent falls or injuries. Denies hx of recurrent neck pain or migraines. Denies any current coughing, nasal congestion, sore throat, vomiting, or diarrhea.   Pt afebrile in ED but states she has been taking motrin/tylenol around the clock.  The history is provided by the patient. No language interpreter was used.  Neck Pain Pain location:  Generalized neck Quality:  Aching Pain radiates to:  Does not radiate Pain severity:  Moderate Duration:  3 days Associated symptoms: fever (subjective) and headaches   Associated symptoms: no chest pain        Past Medical History:  Diagnosis Date   Anemia    prior pregnancies   Anxiety    Depression    Dysrhythmia    "skipping" HR at beginning of pregnancy   H/O cervical polypectomy    Heartburn during pregnancy    History of cesarean section 08/22/2014   Hypertension 2005   while in labor   Kidney stone    Palpitation    SVT (supraventricular tachycardia) (HCC)    SVT (supraventricular tachycardia) (HCC)    Tachycardia     Patient Active Problem List   Diagnosis Date Noted   Encounter to establish care 10/07/2020   Wound, open, nose 10/07/2020   Screening due 10/07/2020   Hyperthyroidism 10/07/2020   IDA (iron deficiency anemia) 10/07/2020   Cervical high risk HPV (human papillomavirus) test positive 07/31/2016   Depression  10/02/2015   Breast lump 10/02/2015    Past Surgical History:  Procedure Laterality Date   CESAREAN SECTION     CESAREAN SECTION WITH BILATERAL TUBAL LIGATION Bilateral 08/22/2014   Procedure: CESAREAN SECTION WITH BILATERAL TUBAL LIGATION;  Surgeon: Essie Hart, MD;  Location: [facility];  Service: Obstetrics;  Laterality: Bilateral;   ELBOW SURGERY     Left     OB History     Gravida  4   Para  3   Term  3   Preterm      AB  1   Living  1      SAB  1   IAB      Ectopic      Multiple  0   Live Births  1           Family History  Problem Relation Age of Onset   Hypertension Mother    Diabetes Mother    Diabetes Brother    Diabetes Maternal Grandmother    Hypertension Maternal Grandmother    Heart disease Maternal Grandmother    Cancer Paternal Grandmother    Cancer Paternal Grandfather     Social History   Tobacco Use   Smoking status: Every Day    Packs/day: 0.25    Types: Cigarettes   Smokeless tobacco: Never  Vaping Use   Vaping Use: Former  Substance Use Topics   Alcohol use: Not Currently    Comment: maybe 1-2  per year   Drug use: No    Home Medications Prior to Admission medications   Medication Sig Start Date End Date Taking? Authorizing Provider  ferrous sulfate 325 (65 FE) MG tablet Take 1 tablet (325 mg total) by mouth daily. 10/07/20   Heather Roberts, NP    Allergies    Levaquin [levofloxacin in d5w], Levofloxacin, Pertussis vaccines, and Tetanus-diphth-acell pertussis  Review of Systems   Review of Systems  Constitutional:  Positive for fever (subjective). Negative for chills.  HENT:  Negative for ear pain and sore throat.   Eyes:  Negative for pain and visual disturbance.  Respiratory:  Negative for cough and shortness of breath.   Cardiovascular:  Negative for chest pain and palpitations.  Gastrointestinal:  Negative for abdominal pain and vomiting.  Genitourinary:  Negative for dysuria and hematuria.  Musculoskeletal:   Positive for neck pain and neck stiffness. Negative for arthralgias and back pain.  Skin:  Negative for color change and rash.  Neurological:  Positive for headaches. Negative for seizures and syncope.  All other systems reviewed and are negative.  Physical Exam Updated Vital Signs BP (!) 142/88 (BP Location: Left Arm)   Pulse 91   Temp 98.7 F (37.1 C) (Oral)   Resp 17   Ht 5\' 5"  (1.651 m)   Wt 107 kg   LMP 10/05/2020 (Exact Date)   SpO2 100%   BMI 39.27 kg/m   Physical Exam Vitals and nursing note reviewed.  Constitutional:      General: She is not in acute distress.    Appearance: She is well-developed.  HENT:     Head: Normocephalic and atraumatic.  Eyes:     Conjunctiva/sclera: Conjunctivae normal.  Cardiovascular:     Rate and Rhythm: Normal rate and regular rhythm.     Heart sounds: No murmur heard. Pulmonary:     Effort: Pulmonary effort is normal. No respiratory distress.     Breath sounds: Normal breath sounds.  Abdominal:     Palpations: Abdomen is soft.     Tenderness: There is no abdominal tenderness.  Musculoskeletal:     Cervical back: Rigidity (Patient unable to flex neck forward, chin to chest, due to discomfort), tenderness and bony tenderness present. Muscular tenderness present.     Thoracic back: No tenderness or bony tenderness.     Lumbar back: No tenderness or bony tenderness.  Skin:    General: Skin is warm and dry.  Neurological:     Mental Status: She is alert.    ED Results / Procedures / Treatments   Labs (all labs ordered are listed, but only abnormal results are displayed) Labs Reviewed  CBC  COMPREHENSIVE METABOLIC PANEL    EKG None  Radiology No results found.  Procedures Procedures   Medications Ordered in ED Medications - No data to display  ED Course  I have reviewed the triage vital signs and the nursing notes.  Pertinent labs & imaging results that were available during my care of the patient were reviewed by  me and considered in my medical decision making (see chart for details).    MDM Rules/Calculators/A&P                         8:04 AM 35 yo female presenting for neck pain/stiffness and subjective fevers. Week 3 post covid 19 virus dx. Pt is Aox3, no acute distress, afebrile (but states she took motrin pta), nontoxic appearing, sitting in bed  indian style watching tv, with otherwise stable vitals.   -During your visit today you remained afebrile and non toxic. No AMS. -Lab studies showed minimal white blood cell count -CT scan of head and neck demonstrated no acute process -We discussed lumbar puncture due to history of fevers and neck pain and inability to rule out meningitis without.  -At this time you have chosen to return home but please return promptly for fevers, worsening neck pain, worsening symptoms, or altered mental status. Patient discharged home into the care of her husband who also agrees to return promptly if symptoms worsen.    Patient in no distress and overall condition improved here in the ED. Pain likely musculoskeletal in nature versus viral meningitis. Detailed discussions were had with the patient regarding current findings, and need for close f/u with PCP or on call doctor. The patient has been instructed to return immediately if the symptoms worsen in any way for re-evaluation. Patient verbalized understanding and is in agreement with current care plan. All questions answered prior to discharge.    Final Clinical Impression(s) / ED Diagnoses Final diagnoses:  Neck pain  Subjective fever    Rx / DC Orders ED Discharge Orders     None        Franne Forts, DO 10/28/20 1232    Edwin Dada P, DO 10/28/20 1238

## 2020-10-27 NOTE — ED Triage Notes (Addendum)
Pt c/o stiff neck for the past few days. Pt also c/o dull headache last night and possible fever. Pt states she tylenol, BC powder, and ibuprofen with no relief.   Pt had COVID about 3 weeks ago.

## 2020-10-27 NOTE — ED Notes (Signed)
Pt to CT

## 2020-10-27 NOTE — Discharge Instructions (Addendum)
-  During your visit today you remained afebrile  -Lab studies showed minimal white blood cell count -Cat scan of head and neck demonstrated no acute process -We discussed lumbar puncture due to history of fevers and neck pain and inability to rule out meningitis without.  -At this time you have chosen to return home but please return promptly for fevers, worsening neck pain, worsening symptoms, or altered mental status.

## 2020-11-06 LAB — CBC WITH DIFFERENTIAL/PLATELET
Basophils Absolute: 0.1 10*3/uL (ref 0.0–0.2)
Basos: 1 %
EOS (ABSOLUTE): 0.1 10*3/uL (ref 0.0–0.4)
Eos: 1 %
Hematocrit: 39.5 % (ref 34.0–46.6)
Hemoglobin: 12.4 g/dL (ref 11.1–15.9)
Immature Grans (Abs): 0 10*3/uL (ref 0.0–0.1)
Immature Granulocytes: 0 %
Lymphocytes Absolute: 3.3 10*3/uL — ABNORMAL HIGH (ref 0.7–3.1)
Lymphs: 31 %
MCH: 23.1 pg — ABNORMAL LOW (ref 26.6–33.0)
MCHC: 31.4 g/dL — ABNORMAL LOW (ref 31.5–35.7)
MCV: 74 fL — ABNORMAL LOW (ref 79–97)
Monocytes Absolute: 0.7 10*3/uL (ref 0.1–0.9)
Monocytes: 7 %
Neutrophils Absolute: 6.4 10*3/uL (ref 1.4–7.0)
Neutrophils: 60 %
Platelets: 355 10*3/uL (ref 150–450)
RBC: 5.37 x10E6/uL — ABNORMAL HIGH (ref 3.77–5.28)
RDW: 20.3 % — ABNORMAL HIGH (ref 11.7–15.4)
WBC: 10.7 10*3/uL (ref 3.4–10.8)

## 2020-11-06 LAB — CMP14+EGFR
ALT: 22 IU/L (ref 0–32)
AST: 17 IU/L (ref 0–40)
Albumin/Globulin Ratio: 1.6 (ref 1.2–2.2)
Albumin: 4.3 g/dL (ref 3.8–4.8)
Alkaline Phosphatase: 101 IU/L (ref 44–121)
BUN/Creatinine Ratio: 11 (ref 9–23)
BUN: 7 mg/dL (ref 6–20)
Bilirubin Total: 0.2 mg/dL (ref 0.0–1.2)
CO2: 20 mmol/L (ref 20–29)
Calcium: 9.5 mg/dL (ref 8.7–10.2)
Chloride: 103 mmol/L (ref 96–106)
Creatinine, Ser: 0.65 mg/dL (ref 0.57–1.00)
Globulin, Total: 2.7 g/dL (ref 1.5–4.5)
Glucose: 81 mg/dL (ref 65–99)
Potassium: 4.2 mmol/L (ref 3.5–5.2)
Sodium: 138 mmol/L (ref 134–144)
Total Protein: 7 g/dL (ref 6.0–8.5)
eGFR: 116 mL/min/{1.73_m2} (ref >59)

## 2020-11-06 LAB — IRON,TIBC AND FERRITIN PANEL
Ferritin: 14 ng/mL — ABNORMAL LOW (ref 15–150)
Iron Saturation: 8 % — CL (ref 15–55)
Iron: 28 ug/dL (ref 27–159)
Total Iron Binding Capacity: 342 ug/dL (ref 250–450)
UIBC: 314 ug/dL (ref 131–425)

## 2020-11-06 LAB — LIPID PANEL WITH LDL/HDL RATIO
Cholesterol, Total: 178 mg/dL (ref 100–199)
HDL: 46 mg/dL (ref >39)
LDL Chol Calc (NIH): 114 mg/dL — ABNORMAL HIGH (ref 0–99)
LDL/HDL Ratio: 2.5 ratio (ref 0.0–3.2)
Triglycerides: 101 mg/dL (ref 0–149)
VLDL Cholesterol Cal: 18 mg/dL (ref 5–40)

## 2020-11-06 LAB — HEPATITIS C ANTIBODY: Hep C Virus Ab: <0.1 s/co ratio (ref 0.0–0.9)

## 2020-11-06 NOTE — Progress Notes (Signed)
Iron is low, but improving. We will discuss this tomorrow.

## 2020-11-07 ENCOUNTER — Ambulatory Visit (INDEPENDENT_AMBULATORY_CARE_PROVIDER_SITE_OTHER): Payer: Commercial Managed Care - PPO | Admitting: Nurse Practitioner

## 2020-11-07 ENCOUNTER — Other Ambulatory Visit: Payer: Self-pay

## 2020-11-07 ENCOUNTER — Encounter: Payer: Self-pay | Admitting: Nurse Practitioner

## 2020-11-07 VITALS — BP 147/84 | HR 105 | Ht 65.0 in | Wt 235.0 lb

## 2020-11-07 VITALS — BP 128/85 | HR 99 | Ht 65.0 in | Wt 235.6 lb

## 2020-11-07 DIAGNOSIS — E0500 Thyrotoxicosis with diffuse goiter without thyrotoxic crisis or storm: Secondary | ICD-10-CM

## 2020-11-07 DIAGNOSIS — L709 Acne, unspecified: Secondary | ICD-10-CM | POA: Insufficient documentation

## 2020-11-07 DIAGNOSIS — E0590 Thyrotoxicosis, unspecified without thyrotoxic crisis or storm: Secondary | ICD-10-CM

## 2020-11-07 DIAGNOSIS — Z833 Family history of diabetes mellitus: Secondary | ICD-10-CM

## 2020-11-07 DIAGNOSIS — R87810 Cervical high risk human papillomavirus (HPV) DNA test positive: Secondary | ICD-10-CM | POA: Diagnosis not present

## 2020-11-07 DIAGNOSIS — Z23 Encounter for immunization: Secondary | ICD-10-CM | POA: Diagnosis not present

## 2020-11-07 DIAGNOSIS — Z8349 Family history of other endocrine, nutritional and metabolic diseases: Secondary | ICD-10-CM | POA: Diagnosis not present

## 2020-11-07 DIAGNOSIS — D509 Iron deficiency anemia, unspecified: Secondary | ICD-10-CM | POA: Diagnosis not present

## 2020-11-07 DIAGNOSIS — Z0001 Encounter for general adult medical examination with abnormal findings: Secondary | ICD-10-CM

## 2020-11-07 DIAGNOSIS — F17200 Nicotine dependence, unspecified, uncomplicated: Secondary | ICD-10-CM | POA: Diagnosis not present

## 2020-11-07 NOTE — Patient Instructions (Addendum)
Please have fasting labs drawn 2-3 days prior to your appointment so we can discuss the results during your office visit.  ACNE-ASSOCIATED FACTORS Proposed contributory factors for acne have included skin trauma, dietary habits, stress, insulin resistance, and body mass index. Skin trauma -- Repetitive mechanical trauma caused by scrubbing affected skin with soaps, detergents, astringents, or other agents may worsen acne by rupturing comedones, promoting the development of inflammatory lesions [28]. Diet -- The role of diet in acne is an evolving concept [29-33]. Several studies suggest an association between acne and increased milk consumption and high glycemic load diets. Increased levels of insulin-like growth factor (IGF) related to dairy consumption or high glycemic load diets and natural hormonal components of milk or other bioactive molecules in milk are hypothesized to play a role [34-37] (see 'Insulin resistance' below): ?Milk consumption - A study of 47,355 women in the Nurses' Health Study that used retrospective data collection to determine diet during high school found an association between acne and intake of milk [34]. Two subsequent, large, prospective, cohort studies (one involving boys and the other involving girls) also reported an association of milk ingestion and acne [38,39]. All three studies were questionnaire-based, requiring subjects to recall their dietary intake and self-diagnose acne and disease severity. A case-control study of 205 patients with clinician-confirmed moderate to severe acne and 358 controls with mild or nonexistent acne also found an association between milk consumption (more than three portions per week) and moderate to severe acne (odds ratio [OR] 1.78, 95% CI 1.22-2.59) [23]. Similar to the other studies, food intake history was assessed via a retrospective patient questionnaire. A case-control study that used three 24-hour diet recall phone interviews to  assess typical food intake in 120 teenagers (ages 40 to 14 years) with moderate facial acne and 105 teenagers without acne suggests that the association of acne with higher milk consumption may be limited to low-fat and skim milk [40]. The study did not find an association between acne and consumption of full-fat milk. A longitudinal, questionnaire-based, population study of [place] adolescents demonstrated an association between high intakes of dairy products and acne in adolescence [41]. ?High glycemic load diets - Glycemic load is a nutritional concept that refers to a measure of glycemic effect (glucose-raising potential) of dietary carbohydrates. (See "Dietary carbohydrates", section on 'Glycemic load'.) A 12-week, randomized trial that compared low and high glycemic load diets in 7 female patients with acne found a greater reduction in lesion counts with the low glycemic load diet [42]. However, the participants on that diet also lost more weight than those on the high glycemic load diet, so it is possible that the improvement in acne was due to changes in weight rather than the composition of the diet. Additionally, a study of 20 subjects with altered metabolic profiles, randomized to a low glycemic load diet and metformin versus control, resulted in statistically significant improvements in both acne and metabolic parameters in the intervention group [43]. ?Other factors - Although it is a common assumption that chocolate consumption increases severity of acne, a relationship between chocolate consumption and the prevalence or severity of acne has not been proven [32]. Data on favorable effects of dietary factors, such as zinc, omega-3 fatty acids, antioxidants, vitamin A, and dietary fiber, on acne vulgaris are limited [44]. Further studies are necessary to determine the roles of these supplements in acne vulgaris. Stress -- Psychologic stress is often proposed as a potential exacerbating factor for  acne [45,46]. Some studies have found an  association between stress and increased acne severity. A prospective cohort study in 45 secondary school students compared acne severity and sebum production at times of high stress (midterm examinations) and low stress (summer holidays) [47]. Sebum production did not appear to be related to stress, but increased acne severity, as assessed by an examiner blinded to the hypothesis of the study, did appear to be associated with stress, particularly in boys. Similarly, a study of 22 university students found that greater acne severity appeared to correlate with increased perceived stress around the time of school examinations [48]. Insulin resistance -- Insulin resistance may play a role in acne. Insulin resistance may stimulate increased androgen production and is associated with increased serum levels of insulin-like growth factor-1 (IGF-1), a finding linked to increased facial sebum excretion [49]. There is a normal rise in insulin resistance and IGF-1 during puberty, the typical time of onset of acne. In addition, some studies have found higher serum IGF-1 levels in women with postadolescent acne than in women without acne [50] or a positive correlation between serum IGF-1 levels and acne lesion counts in women [36]. Moreover, a cross-sectional study of 100 postadolescent males with acne found a higher prevalence of insulin resistance in males with acne than in 100 age-matched controls (22 versus 11 percent) [51]. Body mass index -- Studies evaluating the relationship between acne vulgaris and weight have yielded varied results, resulting in uncertainty regarding the relationship between these conditions [23,52-56]. One of the largest studies, a population-based study of over 600,000 [place] adolescents and young adults (mean age 26 years), found an inverse relationship between excess weight and acne. As body mass index (BMI) increased, risk for acne progressively  decreased, with diagnoses of acne in 20 and 16 percent of underweight (BMI <18.5) males and females, respectively, compared with 13 and 11 percent of severely obese (BMI >35) males and females, respectively [55]. The adjusted OR for acne in severely obese individuals was 0.53 (95% CI 0.42-0.64) for males and 0.5 (95% CI 0.37-0.62) for females. Individuals with a BMI of 18.5 to 22 were defined as the reference group. In contrast, a case-control study of approximately 200 adolescents and young adults (ages 76 to 80) with moderate to severe acne and approximately 350 controls with no acne or mild acne found a correlation between low BMI and a reduced risk for moderate to severe acne that was most evident among males [23]. In addition, a cross-sectional, survey-based study of approximately 88 young adults (ages 32 and 61) in [place] found an association between rising BMI and increased risk for acne among females [53].

## 2020-11-07 NOTE — Assessment & Plan Note (Signed)
-  refusing PAP today d/t menstruation -will get PAP at next visit

## 2020-11-07 NOTE — Progress Notes (Signed)
Established Patient Office Visit  Subjective:  Patient ID: Paula Myers, female    DOB: [birth year]  Age: 35 y.o. MRN: [id number]  CC:  Chief Complaint  Patient presents with   Annual Exam    CPE     HPI [name] presents for physical exam with PAP.  She had hyperthyroidism, and was referred to endocrinology. She had I-123 thyroid scan, and this suggested Grave's disease.  She will have ablation in the future, and thyroid meds will be managed by endocrinology.  She had iron deficiency anemia, and has been taking iron supplementation, but iron levels are still low.  She states that she has noticed acne on her back and legs, and this has increased in the last few months. Past Medical History:  Diagnosis Date   Anemia    prior pregnancies   Anxiety    Depression    Dysrhythmia    "skipping" HR at beginning of pregnancy   H/O cervical polypectomy    Heartburn during pregnancy    History of cesarean section 08/22/2014   Hypertension 2005   while in labor   Kidney stone    Palpitation    SVT (supraventricular tachycardia) (HCC)    SVT (supraventricular tachycardia) (HCC)    Tachycardia     Past Surgical History:  Procedure Laterality Date   CESAREAN SECTION     CESAREAN SECTION WITH BILATERAL TUBAL LIGATION Bilateral 08/22/2014   Procedure: CESAREAN SECTION WITH BILATERAL TUBAL LIGATION;  Surgeon: Sanjuana Kava, MD;  Location: [facility];  Service: Obstetrics;  Laterality: Bilateral;   ELBOW SURGERY     Left    Family History  Problem Relation Age of Onset   Hypertension Mother    Diabetes Mother    Diabetes Brother    Diabetes Maternal Grandmother    Hypertension Maternal Grandmother    Heart disease Maternal Grandmother    Cancer Paternal Grandmother    Cancer Paternal Grandfather     Social History   Socioeconomic History   Marital status: Married    Spouse name: Not on file   Number of children: 3   Years of education: Not on file   Highest  education level: Not on file  Occupational History   Occupation: [employer]    Comment: Dispatcher  Tobacco Use   Smoking status: Every Day    Packs/day: 0.25    Types: Cigarettes   Smokeless tobacco: Never  Vaping Use   Vaping Use: Former  Substance and Sexual Activity   Alcohol use: Not Currently    Comment: maybe 1-2 per year   Drug use: No   Sexual activity: Yes    Birth control/protection: Surgical  Other Topics Concern   Not on file  Social History Narrative   Not on file   Social Determinants of Health   Financial Resource Strain: Not on file  Food Insecurity: Not on file  Transportation Needs: Not on file  Physical Activity: Not on file  Stress: Not on file  Social Connections: Not on file  Intimate Partner Violence: Not on file    Outpatient Medications Prior to Visit  Medication Sig Dispense Refill   ferrous sulfate 325 (65 FE) MG tablet Take 1 tablet (325 mg total) by mouth daily. 90 tablet 1   oxyCODONE (ROXICODONE) 5 MG immediate release tablet Take 1 tablet (5 mg total) by mouth every 4 (four) hours as needed for severe pain. (Patient not taking: No sig reported) 8 tablet 0  No facility-administered medications prior to visit.    Allergies  Allergen Reactions   Levaquin [Levofloxacin In D5w] Anaphylaxis   Levofloxacin Anaphylaxis   Pertussis Vaccines Swelling    T-Dap injection caused localized swelling of arm. Has had Tetanus before without problems.   Tetanus-Diphth-Acell Pertussis Swelling    ROS Review of Systems  Constitutional: Negative.   HENT: Negative.    Eyes: Negative.   Respiratory: Negative.    Cardiovascular: Negative.   Gastrointestinal: Negative.   Endocrine: Negative.   Genitourinary: Negative.   Musculoskeletal: Negative.   Skin:        Increase in acne to her back and legs; denies increase facial hair  Allergic/Immunologic: Negative.   Neurological: Negative.   Hematological: Negative.    Psychiatric/Behavioral: Negative.       Objective:    Physical Exam Constitutional:      Appearance: Normal appearance. She is obese.  HENT:     Head: Normocephalic and atraumatic.     Right Ear: Tympanic membrane, ear canal and external ear normal.     Left Ear: Tympanic membrane, ear canal and external ear normal.     Nose: Nose normal.     Mouth/Throat:     Mouth: Mucous membranes are moist.     Pharynx: Oropharynx is clear.  Eyes:     Extraocular Movements: Extraocular movements intact.     Conjunctiva/sclera: Conjunctivae normal.     Pupils: Pupils are equal, round, and reactive to light.  Cardiovascular:     Rate and Rhythm: Normal rate and regular rhythm.     Pulses: Normal pulses.     Heart sounds: Normal heart sounds.  Pulmonary:     Effort: Pulmonary effort is normal.     Breath sounds: Normal breath sounds.  Abdominal:     General: Abdomen is flat. Bowel sounds are normal.     Palpations: Abdomen is soft.  Musculoskeletal:        General: Normal range of motion.     Cervical back: Normal range of motion and neck supple.  Skin:    General: Skin is warm and dry.     Capillary Refill: Capillary refill takes less than 2 seconds.  Neurological:     General: No focal deficit present.     Mental Status: She is alert and oriented to person, place, and time.     Cranial Nerves: No cranial nerve deficit.     Sensory: No sensory deficit.     Motor: No weakness.     Coordination: Coordination normal.     Gait: Gait normal.  Psychiatric:        Mood and Affect: Mood normal.        Behavior: Behavior normal.        Thought Content: Thought content normal.        Judgment: Judgment normal.    BP (!) 147/84 (BP Location: Left Arm, Patient Position: Sitting, Cuff Size: Large)   Pulse (!) 105   Ht _0  (1.651 m)   Wt 235 lb (106.6 kg)   LMP 11/03/2020 (Exact Date)   SpO2 98%   BMI 39.11 kg/m  Wt Readings from Last 3 Encounters:  11/07/20 235 lb (106.6 kg)   11/07/20 235 lb 9.6 oz (106.9 kg)  10/27/20 236 lb (107 kg)     Health Maintenance Due  Topic Date Due   INFLUENZA VACCINE  09/30/2020    There are no preventive care reminders to display for this patient.  Lab  Results  Component Value Date   TSH <0.005 (L) 10/07/2020   Lab Results  Component Value Date   WBC 10.7 11/05/2020   HGB 12.4 11/05/2020   HCT 39.5 11/05/2020   MCV 74 (L) 11/05/2020   PLT 355 11/05/2020   Lab Results  Component Value Date   NA 138 11/05/2020   K 4.2 11/05/2020   CO2 20 11/05/2020   GLUCOSE 81 11/05/2020   BUN 7 11/05/2020   CREATININE 0.65 11/05/2020   BILITOT 0.2 11/05/2020   ALKPHOS 101 11/05/2020   AST 17 11/05/2020   ALT 22 11/05/2020   PROT 7.0 11/05/2020   ALBUMIN 4.3 11/05/2020   CALCIUM 9.5 11/05/2020   ANIONGAP 6 10/27/2020   EGFR 116 11/05/2020   GFR 102.38 01/15/2014   Lab Results  Component Value Date   CHOL 178 11/05/2020   Lab Results  Component Value Date   HDL 46 11/05/2020   Lab Results  Component Value Date   LDLCALC 114 (H) 11/05/2020   Lab Results  Component Value Date   TRIG 101 11/05/2020   No results found for: CHOLHDL No results found for: HGBA1C    Assessment & Plan:   Problem List Items Addressed This Visit       Endocrine   Hyperthyroidism    -managed by [facility] -will have radioactive ablation soon      Relevant Orders   TSH + free T4     Musculoskeletal and Integument   Acne    -has increased since having hyperthyroidism -she will get thyroid treatment, but if this persists, can return to clinic        Other   Cervical high risk HPV (human papillomavirus) test positive    -refusing PAP today d/t menstruation -will get PAP at next visit      Encounter for general adult medical examination with abnormal findings   Relevant Orders   CBC with Differential/Platelet   CMP14+EGFR   Lipid Panel With LDL/HDL Ratio   Iron, TIBC and Ferritin Panel   TSH + free T4   IDA  (iron deficiency anemia) - Primary   Relevant Orders   Ambulatory referral to Hematology / Oncology   CBC with Differential/Platelet   CMP14+EGFR   Iron, TIBC and Ferritin Panel    No orders of the defined types were placed in this encounter.   Follow-up: Return in about 6 months (around 05/07/2021) for Lab follow-up (IDA with PAP).    Noreene Larsson, NP

## 2020-11-07 NOTE — Patient Instructions (Signed)
Radioiodine (I-131) Therapy for Hyperthyroidism ?Radioiodine (I-131) therapy is a treatment for an overactive thyroid gland (hyperthyroidism). The thyroid is a gland in the neck that uses iodine to help control how the body uses food (metabolism). This treatment involves swallowing a pill or liquid that contains I-131. I-131 is manufactured (synthetic) iodine that gives off radiation. After it is swallowed, the I-131 will be absorbed by the thyroid gland over the next few months. It will destroy thyroid cells and reverse hyperthyroidism. ?Tell a health care provider about: ?Any allergies you have. ?All medicines you are taking, including vitamins, herbs, eye drops, creams, and over-the-counter medicines. ?Any blood disorders you have. ?Any surgeries you have had. ?Any medical conditions you have. ?Whether you are pregnant, may be pregnant, or have gone through menopause, if this applies. ?Whether you currently have children. ?Whether you are breastfeeding. ?Whether you plan to have children in the next 2 years. ?Any contact you have with children or pregnant women. ?Your travel plans for the next 3 months. ?Whether you pass through radiation detectors for work or travel. ?What are the risks? ?Generally, this is a safe procedure. However, problems may occur, including: ?Damage to other structures or organs, such as the salivary glands. This could lead to dry mouth and loss of taste. ?Low sperm count, if this applies. This may lead to temporary infertility. ?Sore throat or neck pain. This is temporary. ?Slightly increased risk of thyroid cancer. ?Nausea or vomiting. ?What happens before the procedure? ?Staying hydrated ?Follow instructions from your health care provider about hydration, which may include: ?Up to 2 hours before the procedure - you may continue to drink clear liquids, such as water, clear fruit juice, black coffee, and plain tea. ?Eating and drinking restrictions ?Follow instructions from your health  care provider about eating and drinking restrictions. ?Follow a low-iodine diet as told by your health care provider. Check ingredients on packaged foods and drinks because there are foods that you will need to avoid while on the low-iodine diet: ?Avoid iodized table salt and foods that have iodized salt. ?Avoid seafood, seaweed, soybeans, and soy products. ?Avoid dairy products and eggs. ?Avoid the food dye Red No. 3 because it has iodine. ?Medicines ? ?Ask your health care provider about: ?Changing or stopping your regular medicines. This is especially important if you are taking diabetes medicines, blood thinners, or thyroid medicines. ?Taking over-the-counter medicines, vitamins, herbs, and supplements. ?General instructions ?Women may be asked to take a pregnancy test. ?Women who are breastfeeding should: ?Plan to stop at least 6 weeks before the procedure. ?Not go back to breastfeeding after the procedure until their health care provider approves. ?Plan to avoid contact with other people for 1 week after your treatment. Avoiding contact with children and pregnant women is especially important. To do this, plan to stay home from work, arrange child care, and sleep alone, if these things apply to you. ?Plan to drive yourself home after treatment. Do not take public transportation. If you need someone to drive you home, sit as far away from the driver as possible. ?What happens during the procedure? ?You will be given a dose of I-131 to swallow. It may be a pill or a liquid. ?Your thyroid gland will absorb the I-131 over the next 3 months. The treatment process will be complete in about 6 months. ?What happens after the procedure? ?You may need to stay in the hospital for 24 hours after your treatment. This depends on the requirements in your state. ?Follow instructions   from your health care provider about: ?How to take care of yourself after the procedure. ?How to protect others from exposure to radiation as it  leaves your body. ?Summary ?Radioiodine (I-131) therapy is a treatment for an overactive thyroid gland (hyperthyroidism). ?This treatment involves swallowing a pill or liquid that contains I-131. I-131 is manufactured iodine that gives off radiation. ?Your thyroid gland will absorb the I-131 over the next 3 months. The I-131 destroys thyroid cells and reverses hyperthyroidism. ?Follow instructions from your health care provider about how to take care of yourself and how to protect other people from exposure to radiation after the procedure. ?This information is not intended to replace advice given to you by your health care provider. Make sure you discuss any questions you have with your health care provider. ?Document Revised: 03/31/2018 Document Reviewed: 03/31/2018 ?Elsevier Patient Education ? 2022 [employer] ? ?

## 2020-11-07 NOTE — Assessment & Plan Note (Signed)
-  managed by [facility] -will have radioactive ablation soon

## 2020-11-07 NOTE — Assessment & Plan Note (Signed)
-  has increased since having hyperthyroidism -she will get thyroid treatment, but if this persists, can return to clinic

## 2020-11-07 NOTE — Progress Notes (Signed)
11/07/2020     Endocrinology Follow Up Note    Subjective:    Patient ID: Paula Myers, female    DOB: June 19, 1985, PCP Noreene Larsson, NP.   Past Medical History:  Diagnosis Date   Anemia    prior pregnancies   Anxiety    Depression    Dysrhythmia    "skipping" HR at beginning of pregnancy   H/O cervical polypectomy    Heartburn during pregnancy    History of cesarean section 08/22/2014   Hypertension 2005   while in labor   Kidney stone    Palpitation    SVT (supraventricular tachycardia) (HCC)    SVT (supraventricular tachycardia) (HCC)    Tachycardia     Past Surgical History:  Procedure Laterality Date   CESAREAN SECTION     CESAREAN SECTION WITH BILATERAL TUBAL LIGATION Bilateral 08/22/2014   Procedure: CESAREAN SECTION WITH BILATERAL TUBAL LIGATION;  Surgeon: Sanjuana Kava, MD;  Location: [facility];  Service: Obstetrics;  Laterality: Bilateral;   ELBOW SURGERY     Left    Social History   Socioeconomic History   Marital status: Married    Spouse name: Not on file   Number of children: 3   Years of education: Not on file   Highest education level: Not on file  Occupational History   Occupation: [employer]    Comment: Dispatcher  Tobacco Use   Smoking status: Every Day    Packs/day: 0.25    Types: Cigarettes   Smokeless tobacco: Never  Vaping Use   Vaping Use: Former  Substance and Sexual Activity   Alcohol use: Not Currently    Comment: maybe 1-2 per year   Drug use: No   Sexual activity: Yes    Birth control/protection: Surgical  Other Topics Concern   Not on file  Social History Narrative   Not on file   Social Determinants of Health   Financial Resource Strain: Not on file  Food Insecurity: Not on file  Transportation Needs: Not on file  Physical Activity: Not on file  Stress: Not on file  Social Connections: Not on file    Family History  Problem Relation Age of Onset   Hypertension Mother    Diabetes  Mother    Diabetes Brother    Diabetes Maternal Grandmother    Hypertension Maternal Grandmother    Heart disease Maternal Grandmother    Cancer Paternal Grandmother    Cancer Paternal Grandfather     Outpatient Encounter Medications as of 11/07/2020  Medication Sig   ferrous sulfate 325 (65 FE) MG tablet Take 1 tablet (325 mg total) by mouth daily.   oxyCODONE (ROXICODONE) 5 MG immediate release tablet Take 1 tablet (5 mg total) by mouth every 4 (four) hours as needed for severe pain. (Patient not taking: Reported on 11/07/2020)   No facility-administered encounter medications on file as of 11/07/2020.    ALLERGIES: Allergies  Allergen Reactions   Levaquin [Levofloxacin In D5w] Anaphylaxis   Levofloxacin Anaphylaxis   Pertussis Vaccines Swelling    T-Dap injection caused localized swelling of arm. Has had Tetanus before without problems.   Tetanus-Diphth-Acell Pertussis Swelling    VACCINATION STATUS: Immunization History  Administered Date(s) Administered   DTaP 09/09/1983, 11/18/1983, 02/03/1984, 01/25/1985, 10/29/1988   Hepatitis B 12/09/1994, 01/14/1995, 06/16/1995   IPV 09/09/1983, 11/18/1983, 02/03/1984, 01/25/1985   Influenza-Unspecified 12/18/2002, 01/23/2014   MMR 10/28/1984, 12/26/2004   Moderna Sars-Covid-2 Vaccination 12/19/2019, 01/16/2020   Tdap  12/26/2004     HPI  Paula Myers is 35 y.o. female who presents today with a medical history as above. she is being seen in follow up after being seen in consultation for hyperthyroidism requested by Noreene Larsson, NP.  she has been dealing with symptoms of anxiety, irritability, insomnia, tremors, palpitations, hair loss for "a few months." These symptoms are progressively worsening and troubling to her.  her most recent thyroid labs revealed suppressed TSH of < 0.005 and elevated FT4 of 1.84 and elevated FT3 of 5.1 on 10/07/20. she denies dysphagia, choking, shortness of breath, no recent voice change.    she does  family history of thyroid dysfunction in her grandmother (Graves disease) and mother (MNG requiring thyroidectomy), but denies family hx of thyroid cancer. she denies personal history of goiter. she is not on any anti-thyroid medications nor on any thyroid hormone supplements. She does intermittently use of Biotin containing supplements but has not taken it in a few weeks.  she is willing to proceed with appropriate work up and therapy for thyrotoxicosis.   Review of systems  Constitutional: + Minimally fluctuating body weight, current Body mass index is 39.21 kg/m., no fatigue, + subjective hyperthermia, no subjective hypothermia Eyes: no blurry vision, no xerophthalmia ENT: no sore throat, no nodules palpated in throat, no dysphagia/odynophagia, no hoarseness Cardiovascular: no chest pain, no shortness of breath, + intermittent palpitations, no leg swelling Respiratory: no cough, no shortness of breath Gastrointestinal: no nausea/vomiting/diarrhea Musculoskeletal: no muscle/joint aches Skin: no rashes, no hyperemia Neurological: + tremors, no numbness, no tingling, no dizziness Psychiatric: no depression, + anxiety   Objective:    BP 128/85   Pulse 99   Ht _0  (1.651 m)   Wt 235 lb 9.6 oz (106.9 kg)   BMI 39.21 kg/m   Wt Readings from Last 3 Encounters:  11/07/20 235 lb 9.6 oz (106.9 kg)  10/27/20 236 lb (107 kg)  10/10/20 236 lb (107 kg)     BP Readings from Last 3 Encounters:  11/07/20 128/85  10/27/20 121/86  10/10/20 130/83                        Physical Exam- Limited  Constitutional:  Body mass index is 39.21 kg/m. , not in acute distress, normal state of mind Eyes:  EOMI, no exophthalmos Neck: Supple Cardiovascular: RRR, no murmurs, rubs, or gallops, no edema Respiratory: Adequate breathing efforts, no crackles, rales, rhonchi, or wheezing Musculoskeletal: no gross deformities, strength intact in all four extremities, no gross restriction of joint  movements Skin:  no rashes, no hyperemia Neurological: no tremor with outstretched hands   CMP     Component Value Date/Time   NA 138 11/05/2020 0907   K 4.2 11/05/2020 0907   CL 103 11/05/2020 0907   CO2 20 11/05/2020 0907   GLUCOSE 81 11/05/2020 0907   GLUCOSE 90 10/27/2020 0736   BUN 7 11/05/2020 0907   CREATININE 0.65 11/05/2020 0907   CALCIUM 9.5 11/05/2020 0907   PROT 7.0 11/05/2020 0907   ALBUMIN 4.3 11/05/2020 0907   AST 17 11/05/2020 0907   ALT 22 11/05/2020 0907   ALKPHOS 101 11/05/2020 0907   BILITOT 0.2 11/05/2020 0907   GFRNONAA >60 10/27/2020 0736   GFRAA >60 09/22/2019 1938     CBC    Component Value Date/Time   WBC 10.7 11/05/2020 0907   WBC 11.9 (H) 10/27/2020 0736   RBC 5.37 (H) 11/05/2020 9242  RBC 5.30 (H) 10/27/2020 0736   HGB 12.4 11/05/2020 0907   HCT 39.5 11/05/2020 0907   PLT 355 11/05/2020 0907   MCV 74 (L) 11/05/2020 0907   MCH 23.1 (L) 11/05/2020 0907   MCH 23.2 (L) 10/27/2020 0736   MCHC 31.4 (L) 11/05/2020 0907   MCHC 30.7 10/27/2020 0736   RDW 20.3 (H) 11/05/2020 0907   LYMPHSABS 3.3 (H) 11/05/2020 0907   MONOABS 1.0 01/05/2019 0802   EOSABS 0.1 11/05/2020 0907   BASOSABS 0.1 11/05/2020 0907     Diabetic Labs (most recent): No results found for: HGBA1C  Lipid Panel     Component Value Date/Time   CHOL 178 11/05/2020 0907   TRIG 101 11/05/2020 0907   HDL 46 11/05/2020 0907   LDLCALC 114 (H) 11/05/2020 0907   LABVLDL 18 11/05/2020 0907     Lab Results  Component Value Date   TSH <0.005 (L) 10/07/2020   FREET4 1.84 (H) 10/07/2020     Uptake and Scan from 10/17/20 CLINICAL DATA:  Hyperthyroidism, TSH 0.450   EXAM: THYROID SCAN AND UPTAKE - 4 AND 24 HOURS   TECHNIQUE: Following oral administration of I-123 capsule, anterior planar imaging was acquired at 24 hours. Thyroid uptake was calculated with a thyroid probe at 4-6 hours and 24 hours.   RADIOPHARMACEUTICALS:  436 uCi I-123 sodium iodide p.o.    COMPARISON:  None   FINDINGS: Homogeneous tracer distribution in both thyroid lobes.   No focal areas of increased or decreased tracer localization.   4 hour I-123 uptake = 14.1% (normal 5-20%)   24 hour I-123 uptake = 31.1% (normal 10-30%)   IMPRESSION: Normal thyroid scan.   Minimally elevated 24 hour radio iodine uptake.   In the setting of hyperthyroidism, findings suggest Graves disease     Electronically Signed   By: Lavonia Dana M.D.   On: 10/17/2020 15:47   Assessment & Plan:   1. Hyperthyroidism- r/t Grave's disease  she is being seen at a kind request of Noreene Larsson, NP.  Her uptake and scan showed mildly elevated 24-hr uptake at 31% which is consistent with hyperthyroidism r/t Graves disease.  Her TFTs also showed suppressed TSH and elevated FT3 and FT4 levels.   -She elected to proceed with RAI ablation.  Will schedule.  Will recheck TFTs 6 weeks after ablation to assess response to treatment and determine appropriate time to start thyroid hormone replacement.    -Patient is advised to maintain close follow up with Noreene Larsson, NP for primary care needs.    I spent 20 minutes in the care of the patient today including review of labs from Thyroid Function, CMP, and other relevant labs ; imaging/biopsy records (current and previous including abstractions from other facilities); face-to-face time discussing  her lab results and symptoms, medications doses, her options of short and long term treatment based on the latest standards of care / guidelines;   and documenting the encounter.  Glenna Fellows  participated in the discussions, expressed understanding, and voiced agreement with the above plans.  All questions were answered to her satisfaction. she is encouraged to contact clinic should she have any questions or concerns prior to her return visit.    Follow up plan: Return in about 8 weeks (around 01/02/2021) for Thyroid follow up, Previsit  labs 6 weeks after RAI ablation.   Thank you for involving me in the care of this pleasant patient, and I will continue to update you with her  progress.    Rayetta Pigg, [facility] [address] [place], [place] [address] Phone: [phone] Fax: [phone]  11/07/2020, 9:52 AM

## 2020-11-15 ENCOUNTER — Other Ambulatory Visit: Payer: Self-pay

## 2020-11-15 ENCOUNTER — Encounter (HOSPITAL_COMMUNITY): Payer: Self-pay

## 2020-11-15 ENCOUNTER — Encounter (HOSPITAL_COMMUNITY)
Admission: RE | Admit: 2020-11-15 | Discharge: 2020-11-15 | Disposition: A | Discharge status: Home / Self Care | Payer: Commercial Managed Care - PPO | Source: Ambulatory Visit | Attending: Nurse Practitioner | Admitting: Nurse Practitioner

## 2020-11-15 DIAGNOSIS — E0590 Thyrotoxicosis, unspecified without thyrotoxic crisis or storm: Secondary | ICD-10-CM | POA: Diagnosis not present

## 2020-11-15 MED ORDER — SODIUM IODIDE I 131 CAPSULE
30.6000 | Freq: Once | INTRAVENOUS | Status: AC | PRN
  Administered 2020-11-15: 30.6 via ORAL

## 2020-11-21 ENCOUNTER — Encounter (HOSPITAL_COMMUNITY): Payer: Self-pay | Admitting: Hematology

## 2020-11-21 ENCOUNTER — Ambulatory Visit (HOSPITAL_COMMUNITY): Payer: Commercial Managed Care - PPO | Admitting: Physician Assistant

## 2020-11-21 ENCOUNTER — Inpatient Hospital Stay (HOSPITAL_COMMUNITY): Payer: Commercial Managed Care - PPO | Attending: Physician Assistant | Admitting: Hematology

## 2020-11-21 ENCOUNTER — Other Ambulatory Visit: Payer: Self-pay

## 2020-11-21 VITALS — BP 126/83 | HR 98 | Temp 96.8°F | Resp 20 | Wt 236.1 lb

## 2020-11-21 DIAGNOSIS — N920 Excessive and frequent menstruation with regular cycle: Secondary | ICD-10-CM

## 2020-11-21 DIAGNOSIS — F17210 Nicotine dependence, cigarettes, uncomplicated: Secondary | ICD-10-CM | POA: Diagnosis not present

## 2020-11-21 DIAGNOSIS — Z808 Family history of malignant neoplasm of other organs or systems: Secondary | ICD-10-CM | POA: Insufficient documentation

## 2020-11-21 DIAGNOSIS — Z79899 Other long term (current) drug therapy: Secondary | ICD-10-CM

## 2020-11-21 DIAGNOSIS — I10 Essential (primary) hypertension: Secondary | ICD-10-CM | POA: Diagnosis not present

## 2020-11-21 DIAGNOSIS — D500 Iron deficiency anemia secondary to blood loss (chronic): Secondary | ICD-10-CM | POA: Insufficient documentation

## 2020-11-21 DIAGNOSIS — E039 Hypothyroidism, unspecified: Secondary | ICD-10-CM | POA: Diagnosis not present

## 2020-11-21 DIAGNOSIS — Z8041 Family history of malignant neoplasm of ovary: Secondary | ICD-10-CM | POA: Diagnosis not present

## 2020-11-21 HISTORY — DX: Iron deficiency anemia secondary to blood loss (chronic): D50.0

## 2020-11-21 NOTE — Progress Notes (Signed)
[place], [place] [address]   CLINIC:  Medical Oncology/Hematology  CONSULT NOTE  Patient Care Team: Noreene Larsson, NP as PCP - General (Nurse Practitioner)  CHIEF COMPLAINTS/PURPOSE OF CONSULTATION:  Iron deficiency anemia  HISTORY OF PRESENTING ILLNESS:  Paula Myers 35 y.o. female is here at the request of her primary care provider, NP Demetrius Revel due to iron deficiency anemia.  Per PCP note by NP Pearline Cables on 11/07/2020, patient has iron deficiency anemia and has been taking oral iron supplementation without improvement in her iron levels.  Review of past labs shows longstanding iron deficiency with ferritin of 6 in June 2018.  Most recent iron panel (11/05/2020) shows ferritin 14, serum iron 28, and iron saturation 8%.  Her most recent hemoglobin (11/05/2020) is normal at 12.4, but she has had mild anemia in the past with Hgb as low as 9.9 (01/05/2019).  She reports that she has very heavy periods.  She bleeds for 7 to 10 days, and reports that on her heaviest days she will feel an ultra-sized tampon in less than 1 hour.  She has previously tried birth control pills to control her periods, but was unable to tolerate side effects.  She has spoken to her gynecologist about possible uterine ablation.  She denies any history of GI bleeding such as hematochezia or melena.  She has not required blood transfusions or received IV iron in the past.  She does take ferrous sulfate 1 tablet daily, but her iron stores have not improved.  She reports pica cravings for ice.  She is symptomatic with fatigue, reports that her energy is about 50%, and that she is lethargic and struggles to keep up with daily life.  She also complains of palpitations, shortness of breath on exertion, and headache.  She denies any dizziness or syncopal episodes.  She is not on any blood thinners, PPI medications, or NSAID medications.  Past medical history is otherwise notable for  hyperthyroidism/Graves' disease, for which she had radioablation of her thyroid on 11/15/2020.  She also had hypertension and SVT while pregnant.  She lives at home with her husband, and works night shift as a Programmer, applications.  She has 3 children.  She has smoked on and off since she was 35 year old's old, currently smokes less than 0.5 PPD.  She admits to rare social alcohol consumption.  She denies any illicit drug use.  Family history is positive for paternal grand mother with ovarian cancer and paternal grandfather with thyroid cancer.  Maternal grandmother had unspecified anemia.    MEDICAL HISTORY:  Past Medical History:  Diagnosis Date   Anemia    prior pregnancies   Anxiety    Depression    Dysrhythmia    "skipping" HR at beginning of pregnancy   H/O cervical polypectomy    Heartburn during pregnancy    History of cesarean section 08/22/2014   Hypertension 2005   while in labor   Kidney stone    Palpitation    SVT (supraventricular tachycardia) (HCC)    SVT (supraventricular tachycardia) (HCC)    Tachycardia     SURGICAL HISTORY: Past Surgical History:  Procedure Laterality Date   CESAREAN SECTION     CESAREAN SECTION WITH BILATERAL TUBAL LIGATION Bilateral 08/22/2014   Procedure: CESAREAN SECTION WITH BILATERAL TUBAL LIGATION;  Surgeon: Sanjuana Kava, MD;  Location: [facility];  Service: Obstetrics;  Laterality: Bilateral;   ELBOW SURGERY     Left  SOCIAL HISTORY: Social History   Socioeconomic History   Marital status: Married    Spouse name: Not on file   Number of children: 3   Years of education: Not on file   Highest education level: Not on file  Occupational History   Occupation: [employer]    Comment: Dispatcher  Tobacco Use   Smoking status: Every Day    Packs/day: 0.25    Types: Cigarettes   Smokeless tobacco: Never  Vaping Use   Vaping Use: Former  Substance and Sexual Activity   Alcohol use: Not Currently    Comment: maybe 1-2 per year    Drug use: No   Sexual activity: Yes    Birth control/protection: Surgical  Other Topics Concern   Not on file  Social History Narrative   Not on file   Social Determinants of Health   Financial Resource Strain: Not on file  Food Insecurity: Not on file  Transportation Needs: Not on file  Physical Activity: Not on file  Stress: Not on file  Social Connections: Not on file  Intimate Partner Violence: Not on file    FAMILY HISTORY: Family History  Problem Relation Age of Onset   Hypertension Mother    Diabetes Mother    Diabetes Brother    Diabetes Maternal Grandmother    Hypertension Maternal Grandmother    Heart disease Maternal Grandmother    Cancer Paternal Grandmother    Cancer Paternal Grandfather     ALLERGIES:  is allergic to levaquin [levofloxacin in d5w], levofloxacin, pertussis vaccines, and tetanus-diphth-acell pertussis.  MEDICATIONS:  Current Outpatient Medications  Medication Sig Dispense Refill   ferrous sulfate 325 (65 FE) MG tablet Take 1 tablet (325 mg total) by mouth daily. 90 tablet 1   No current facility-administered medications for this visit.    REVIEW OF SYSTEMS:   Review of Systems  Constitutional:  Positive for fatigue. Negative for appetite change, chills, diaphoresis, fever and unexpected weight change.  HENT:   Negative for lump/mass and nosebleeds.   Eyes:  Negative for eye problems.  Respiratory:  Positive for shortness of breath (with exertion). Negative for cough and hemoptysis.   Cardiovascular:  Positive for palpitations. Negative for chest pain and leg swelling.  Gastrointestinal:  Positive for constipation. Negative for abdominal pain, blood in stool, diarrhea, nausea and vomiting.  Genitourinary:  Positive for menstrual problem (menorrhagia). Negative for hematuria.   Skin: Negative.   Neurological:  Positive for headaches. Negative for dizziness and light-headedness.  Hematological:  Does not bruise/bleed easily.   Psychiatric/Behavioral:  Positive for sleep disturbance.      PHYSICAL EXAMINATION: ECOG PERFORMANCE STATUS: 1 - Symptomatic but completely ambulatory  There were no vitals filed for this visit. There were no vitals filed for this visit.  Physical Exam Constitutional:      Appearance: Normal appearance. She is obese.  HENT:     Head: Normocephalic and atraumatic.     Mouth/Throat:     Mouth: Mucous membranes are moist.  Eyes:     Extraocular Movements: Extraocular movements intact.     Pupils: Pupils are equal, round, and reactive to light.  Cardiovascular:     Rate and Rhythm: Normal rate and regular rhythm.     Pulses: Normal pulses.     Heart sounds: Normal heart sounds.  Pulmonary:     Effort: Pulmonary effort is normal.     Breath sounds: Normal breath sounds.  Abdominal:     General: Bowel sounds are  normal.     Palpations: Abdomen is soft.     Tenderness: There is no abdominal tenderness.  Musculoskeletal:        General: No swelling.     Right lower leg: No edema.     Left lower leg: No edema.  Lymphadenopathy:     Cervical: No cervical adenopathy.  Skin:    General: Skin is warm and dry.  Neurological:     General: No focal deficit present.     Mental Status: She is alert and oriented to person, place, and time.  Psychiatric:        Mood and Affect: Mood normal.        Behavior: Behavior normal.      LABORATORY DATA:  I have reviewed the data as listed Recent Results (from the past 2160 hour(s))  TSH+T4F+T3Free     Status: Abnormal   Collection Time: 10/07/20 10:01 AM  Result Value Ref Range   TSH <0.005 (L) 0.450 - 4.500 uIU/mL   T3, Free 5.1 (H) 2.0 - 4.4 pg/mL   Free T4 1.84 (H) 0.82 - 1.77 ng/dL  CBC     Status: Abnormal   Collection Time: 10/27/20  7:36 AM  Result Value Ref Range   WBC 11.9 (H) 4.0 - 10.5 K/uL   RBC 5.30 (H) 3.87 - 5.11 MIL/uL   Hemoglobin 12.3 12.0 - 15.0 g/dL   HCT 40.1 36.0 - 46.0 %   MCV 75.7 (L) 80.0 - 100.0 fL    MCH 23.2 (L) 26.0 - 34.0 pg   MCHC 30.7 30.0 - 36.0 g/dL   RDW 21.2 (H) 11.5 - 15.5 %   Platelets 367 150 - 400 K/uL   nRBC 0.0 0.0 - 0.2 %    Comment: Performed at [facility], [address], [place], [place] [address]  Comprehensive metabolic panel     Status: None   Collection Time: 10/27/20  7:36 AM  Result Value Ref Range   Sodium 137 135 - 145 mmol/L   Potassium 4.3 3.5 - 5.1 mmol/L   Chloride 107 98 - 111 mmol/L   CO2 24 22 - 32 mmol/L   Glucose, Bld 90 70 - 99 mg/dL    Comment: Glucose reference range applies only to samples taken after fasting for at least 8 hours.   BUN 9 6 - 20 mg/dL   Creatinine, Ser 0.58 0.44 - 1.00 mg/dL   Calcium 9.0 8.9 - 10.3 mg/dL   Total Protein 7.9 6.5 - 8.1 g/dL   Albumin 4.0 3.5 - 5.0 g/dL   AST 17 15 - 41 U/L   ALT 25 0 - 44 U/L   Alkaline Phosphatase 75 38 - 126 U/L   Total Bilirubin 0.3 0.3 - 1.2 mg/dL   GFR, Estimated >60 >60 mL/min    Comment: (NOTE) Calculated using the CKD-EPI Creatinine Equation (2021)    Anion gap 6 5 - 15    Comment: Performed at [facility], [address] [place], [place] [address]  hCG, quantitative, pregnancy     Status: None   Collection Time: 10/27/20  7:36 AM  Result Value Ref Range   hCG, Beta Chain, Quant, S <1 <5 mIU/mL    Comment:          GEST. AGE      CONC.  (mIU/mL)   <=1 WEEK        5 - 50     2 WEEKS       50 -  500     3 WEEKS       100 - 10,000     4 WEEKS     1,000 - 30,000     5 WEEKS     3,500 - 115,000   6-8 WEEKS     12,000 - 270,000    12 WEEKS     15,000 - 220,000        FEMALE AND NON-PREGNANT FEMALE:     LESS THAN 5 mIU/mL Performed at [facility], [address] [place], [place] [address]   Protime-INR     Status: None   Collection Time: 10/27/20  8:50 AM  Result Value Ref Range   Prothrombin Time 13.8 11.4 - 15.2 seconds   INR 1.1 0.8 - 1.2    Comment: (NOTE) INR goal varies based on device and disease states. Performed at [facility], [address],  [place], [place] [address]   APTT     Status: None   Collection Time: 10/27/20  8:50 AM  Result Value Ref Range   aPTT 25 24 - 36 seconds    Comment: Performed at [facility], [address] [place], [place] [address]  CBC with Differential/Platelet     Status: Abnormal   Collection Time: 11/05/20  9:07 AM  Result Value Ref Range   WBC 10.7 3.4 - 10.8 x10E3/uL   RBC 5.37 (H) 3.77 - 5.28 x10E6/uL   Hemoglobin 12.4 11.1 - 15.9 g/dL   Hematocrit 39.5 34.0 - 46.6 %   MCV 74 (L) 79 - 97 fL   MCH 23.1 (L) 26.6 - 33.0 pg   MCHC 31.4 (L) 31.5 - 35.7 g/dL   RDW 20.3 (H) 11.7 - 15.4 %   Platelets 355 150 - 450 x10E3/uL   Neutrophils 60 Not Estab. %   Lymphs 31 Not Estab. %   Monocytes 7 Not Estab. %   Eos 1 Not Estab. %   Basos 1 Not Estab. %   Neutrophils Absolute 6.4 1.4 - 7.0 x10E3/uL   Lymphocytes Absolute 3.3 (H) 0.7 - 3.1 x10E3/uL   Monocytes Absolute 0.7 0.1 - 0.9 x10E3/uL   EOS (ABSOLUTE) 0.1 0.0 - 0.4 x10E3/uL   Basophils Absolute 0.1 0.0 - 0.2 x10E3/uL   Immature Granulocytes 0 Not Estab. %   Immature Grans (Abs) 0.0 0.0 - 0.1 x10E3/uL  CMP14+EGFR     Status: None   Collection Time: 11/05/20  9:07 AM  Result Value Ref Range   Glucose 81 65 - 99 mg/dL   BUN 7 6 - 20 mg/dL   Creatinine, Ser 0.65 0.57 - 1.00 mg/dL   eGFR 116 >59 mL/min/1.73   BUN/Creatinine Ratio 11 9 - 23   Sodium 138 134 - 144 mmol/L   Potassium 4.2 3.5 - 5.2 mmol/L   Chloride 103 96 - 106 mmol/L   CO2 20 20 - 29 mmol/L   Calcium 9.5 8.7 - 10.2 mg/dL   Total Protein 7.0 6.0 - 8.5 g/dL   Albumin 4.3 3.8 - 4.8 g/dL   Globulin, Total 2.7 1.5 - 4.5 g/dL   Albumin/Globulin Ratio 1.6 1.2 - 2.2   Bilirubin Total 0.2 0.0 - 1.2 mg/dL   Alkaline Phosphatase 101 44 - 121 IU/L   AST 17 0 - 40 IU/L   ALT 22 0 - 32 IU/L  Lipid Panel With LDL/HDL Ratio     Status: Abnormal   Collection Time: 11/05/20  9:07 AM  Result Value Ref Range   Cholesterol, Total  178 100 - 199 mg/dL   Triglycerides 101 0 - 149 mg/dL   HDL  46 >39 mg/dL   VLDL Cholesterol Cal 18 5 - 40 mg/dL   LDL Chol Calc (NIH) 114 (H) 0 - 99 mg/dL   LDL/HDL Ratio 2.5 0.0 - 3.2 ratio    Comment:                                     LDL/HDL Ratio                                             Men  Women                               1/2 Avg.Risk  1.0    1.5                                   Avg.Risk  3.6    3.2                                2X Avg.Risk  6.2    5.0                                3X Avg.Risk  8.0    6.1   Iron, TIBC and Ferritin Panel     Status: Abnormal   Collection Time: 11/05/20  9:07 AM  Result Value Ref Range   Total Iron Binding Capacity 342 250 - 450 ug/dL   UIBC 314 131 - 425 ug/dL   Iron 28 27 - 159 ug/dL   Iron Saturation 8 (LL) 15 - 55 %   Ferritin 14 (L) 15 - 150 ng/mL  Hepatitis C antibody     Status: None   Collection Time: 11/05/20  9:07 AM  Result Value Ref Range   Hep C Virus Ab <0.1 0.0 - 0.9 s/co ratio    Comment:                                   Negative:     < 0.8                              Indeterminate: 0.8 - 0.9                                   Positive:     > 0.9  HCV antibody alone does not differentiate between  previous resolved infection and active infection.  The CDC and current clinical guidelines recommend  that a positive HCV antibody result be followed up  with an HCV RNA test to support the diagnosis of  acute HCV infection. [facility] offers Hepatitis C  Virus (HCV) RNA, Diagnosis, NAA (287681) and  Hepatitis C Virus (HCV) Antibody with reflex to  Quantitative Real-time PCR (144050).  RADIOGRAPHIC STUDIES: I have personally reviewed the radiological images as listed and agreed with the findings in the report. CT HEAD WO CONTRAST (5MM)  Result Date: 10/27/2020 CLINICAL DATA:  Headache and neck pain. EXAM: CT HEAD WITHOUT CONTRAST CT CERVICAL SPINE WITHOUT CONTRAST TECHNIQUE: Multidetector CT imaging of the head and cervical spine was performed following the standard protocol  without intravenous contrast. Multiplanar CT image reconstructions of the cervical spine were also generated. COMPARISON:  None. FINDINGS: CT HEAD FINDINGS Brain: No evidence of acute infarction, hemorrhage, hydrocephalus, extra-axial collection or mass lesion/mass effect. Vascular: No hyperdense vessel or unexpected calcification. Skull: Normal. Negative for fracture or focal lesion. Sinuses/Orbits: No acute finding. Other: None. CT CERVICAL SPINE FINDINGS Alignment: Normal. Skull base and vertebrae: No acute fracture. No primary bone lesion or focal pathologic process. Soft tissues and spinal canal: No prevertebral fluid or swelling. No visible canal hematoma. Disc levels: Mild degenerative disc disease is noted at C7-T1 with anterior osteophyte formation. Upper chest: Negative. Other: None. IMPRESSION: No acute intracranial abnormality seen. Mild degenerative disc disease is noted at C7-T1. No acute abnormality seen in the cervical spine. Electronically Signed   By: Marijo Conception M.D.   On: 10/27/2020 08:51   CT Cervical Spine Wo Contrast  Result Date: 10/27/2020 CLINICAL DATA:  Headache and neck pain. EXAM: CT HEAD WITHOUT CONTRAST CT CERVICAL SPINE WITHOUT CONTRAST TECHNIQUE: Multidetector CT imaging of the head and cervical spine was performed following the standard protocol without intravenous contrast. Multiplanar CT image reconstructions of the cervical spine were also generated. COMPARISON:  None. FINDINGS: CT HEAD FINDINGS Brain: No evidence of acute infarction, hemorrhage, hydrocephalus, extra-axial collection or mass lesion/mass effect. Vascular: No hyperdense vessel or unexpected calcification. Skull: Normal. Negative for fracture or focal lesion. Sinuses/Orbits: No acute finding. Other: None. CT CERVICAL SPINE FINDINGS Alignment: Normal. Skull base and vertebrae: No acute fracture. No primary bone lesion or focal pathologic process. Soft tissues and spinal canal: No prevertebral fluid or  swelling. No visible canal hematoma. Disc levels: Mild degenerative disc disease is noted at C7-T1 with anterior osteophyte formation. Upper chest: Negative. Other: None. IMPRESSION: No acute intracranial abnormality seen. Mild degenerative disc disease is noted at C7-T1. No acute abnormality seen in the cervical spine. Electronically Signed   By: Marijo Conception M.D.   On: 10/27/2020 08:51   NM RAI Therapy For Hyperthyroidism  Result Date: 11/19/2020 CLINICAL DATA:  Hyperthyroidism. EXAM: RADIOACTIVE IODINE THERAPY FOR HYPERTHYROIDISM COMPARISON:  Thyroid scan and uptake 10/17/2020 TECHNIQUE: Radioactive iodine prescribed by Dr. Candise Che. The risks and benefits of radioactive iodine therapy were discussed with the patient in detail by Dr. Carlis Abbott. Alternative therapies were also mentioned. Radiation safety was discussed with the patient, including how to protect the general public from exposure. There were no barriers to communication. Written consent was obtained. The patient then received a capsule containing the radiopharmaceutical. The patient will follow-up with the referring physician. RADIOPHARMACEUTICALS:  30.6 mCi I-131 sodium iodide orally IMPRESSION: Per oral administration of I-131 sodium iodide for the treatment of hyperthyroidism. Electronically Signed   By: Marijo Sanes M.D.   On: 11/19/2020 13:38    ASSESSMENT & PLAN: 1.  Symptomatic iron deficiency without anemia, secondary to chronic blood loss from menorrhagia - Review of past labs shows longstanding iron deficiency with ferritin of 6 in June 2018.  Most recent iron panel (11/05/2020) shows ferritin 14, serum iron 28, and iron saturation 8%.  Her most recent hemoglobin (11/05/2020) is normal at 12.4, but  she has had mild anemia in the past with Hgb as low as 9.9 (01/05/2019). - She reports very heavy menstrual cycles, is following with gynecology and considering uterine ablation - No gross GI hemorrhage - denies bright red blood per rectum  and melena  - Takes daily ferrous sulfate, but without improvement in her iron levels - Symptomatic with fatigue, palpitations, dyspnea on exertion - Suspect chronic iron deficiency and intermittent anemia secondary to blood loss from menorrhagia.  She may also have some aspect of thalassemia, as she has persistent microcytosis and elevated RBC even when her Hgb is normal. - PLAN: Recommend IV iron with Venofer 1000 mg in 3 divided doses (we will premedicate with steroids due to history of anaphylactic reaction to Levaquin).  We will repeat CBC and iron panel in 3 months.  She is not anemic at this time, so we will hold off on further investigation for the time being.  She is recommended to continue to follow with gynecology, and to consider uterine ablation if recommended by her GYN.  2.  Other history - PMH: Hypothyroidism/Graves' disease with radioablation of thyroid on 11/15/2020; hypertension and SVT during pregnancy - SOCIAL: Lives with her husband and 3 children.  Works night shift as Programmer, applications. - SUBSTANCE: Smoked intermittently since age 52, currently smokes less than 0.5 packs/day.  Rare social alcohol consumption.  No history of illicit drug use. - FAMILY: Paternal grandmother with ovarian cancer.  Paternal grandfather with thyroid cancer.  Maternal grandmother with unspecified anemia.    PLAN SUMMARY & DISPOSITION: - IV Venofer x3 (300-300-400) - Continue oral iron supplementation - Follow-up with gynecologist regarding menorrhagia and treatment options - Repeat iron panel and CBC in 3 months - Phone visit after labs  All questions were answered. The patient knows to call the clinic with any problems, questions or concerns.   Medical decision making: Low  Time spent on visit: I spent 20 minutes counseling the patient face to face. The total time spent in the appointment was 30 minutes and more than 50% was on counseling.  I, Tarri Abernethy PA-C, have seen this patient in  conjunction with Dr. Derek Jack. Greater than 50% of visit was performed by Dr. Delton Coombes.    Harriett Rush, PA-C 11/21/20 10:17 AM   DR. Nyaja Dubuque: I have evaluated her face-to-face and agree with HPI and plan written by Casey Burkitt, PA-C.  In summary, she has iron deficiency state from menstrual blood loss.  She has been on iron supplements for the last 3 months without much help.  Her latest ferritin is 14 and percent saturation of 8.  She will receive parenteral iron therapy with 3 infusions of Venofer.  We will premedicate her as she had anaphylactic reaction to Levaquin.  She will be followed up in 3 months with repeat labs.

## 2020-11-21 NOTE — Patient Instructions (Signed)
[facility] at [facility] Discharge Instructions  You were seen today by Dr. Ellin Saba and Rojelio Brenner PA-C for your iron deficiency anemia.  As we discussed, this is most likely related to your heavy periods.  We will give you IV iron infusions to improve your iron levels, which will hopefully improve your symptoms of fatigue as well.  We recommend that you follow-up with gynecologist to get a second opinion regarding possible intrauterine ablation.  LABS: Return in 3 months for repeat labs  OTHER TESTS: None at this time  MEDICATIONS: IV iron x3 doses.  Continue to take oral iron supplement at home.  FOLLOW-UP APPOINTMENT: Phone visit in 3 months, after labs.   Thank you for choosing [facility] at [facility] to provide your oncology and hematology care.  To afford each patient quality time with our provider, please arrive at least 15 minutes before your scheduled appointment time.   If you have a lab appointment with the Cancer Center please come in thru the Main Entrance and check in at the main information desk.  You need to re-schedule your appointment should you arrive 10 or more minutes late.  We strive to give you quality time with our providers, and arriving late affects you and other patients whose appointments are after yours.  Also, if you no show three or more times for appointments you may be dismissed from the clinic at the providers discretion.     Again, thank you for choosing [facility].  Our hope is that these requests will decrease the amount of time that you wait before being seen by our physicians.       _____________________________________________________________  Should you have questions after your visit to [facility], please contact our office at [phone] and follow the prompts.  Our office hours are 8:00 a.m. and 4:30 p.m. Monday - Friday.  Please note that voicemails left after  4:00 p.m. may not be returned until the following business day.  We are closed weekends and major holidays.  You do have access to a nurse 24-7, just call the main number to the clinic [phone] and do not press any options, hold on the line and a nurse will answer the phone.    For prescription refill requests, have your pharmacy contact our office and allow 72 hours.    Due to Covid, you will need to wear a mask upon entering the hospital. If you do not have a mask, a mask will be given to you at the Main Entrance upon arrival. For doctor visits, patients may have 1 support person age 23 or older with them. For treatment visits, patients can not have anyone with them due to social distancing guidelines and our immunocompromised population.

## 2020-11-22 ENCOUNTER — Encounter (HOSPITAL_COMMUNITY): Payer: Self-pay

## 2020-11-22 ENCOUNTER — Inpatient Hospital Stay (HOSPITAL_COMMUNITY): Payer: Commercial Managed Care - PPO

## 2020-11-22 VITALS — BP 131/80 | HR 93 | Temp 98.4°F | Resp 18

## 2020-11-22 DIAGNOSIS — D500 Iron deficiency anemia secondary to blood loss (chronic): Secondary | ICD-10-CM

## 2020-11-22 MED ORDER — LORATADINE 10 MG PO TABS
10.0000 mg | ORAL_TABLET | Freq: Once | ORAL | Status: AC
  Administered 2020-11-22: 10 mg via ORAL
  Filled 2020-11-22: qty 1

## 2020-11-22 MED ORDER — SODIUM CHLORIDE 0.9 % IV SOLN
300.0000 mg | Freq: Once | INTRAVENOUS | Status: AC
  Administered 2020-11-22: 300 mg via INTRAVENOUS
  Filled 2020-11-22: qty 300

## 2020-11-22 MED ORDER — ACETAMINOPHEN 325 MG PO TABS
650.0000 mg | ORAL_TABLET | Freq: Once | ORAL | Status: AC
  Administered 2020-11-22: 650 mg via ORAL
  Filled 2020-11-22: qty 2

## 2020-11-22 MED ORDER — FAMOTIDINE 20 MG PO TABS
20.0000 mg | ORAL_TABLET | Freq: Once | ORAL | Status: AC
  Administered 2020-11-22: 20 mg via ORAL
  Filled 2020-11-22: qty 1

## 2020-11-22 MED ORDER — SODIUM CHLORIDE 0.9 % IV SOLN: Freq: Once | INTRAVENOUS | Status: AC

## 2020-11-22 MED ORDER — METHYLPREDNISOLONE SODIUM SUCC 125 MG IJ SOLR
125.0000 mg | Freq: Once | INTRAMUSCULAR | Status: AC
  Administered 2020-11-22: 125 mg via INTRAVENOUS
  Filled 2020-11-22: qty 2

## 2020-11-22 NOTE — Progress Notes (Signed)
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  Paula Myers expressed that she has long overnight shifts and had just come from working.  This schedule of infusions suits her as her three children are in school at this time.  Paula Myers expressed that she loves her job despite it being stressful because she is able to help others.  She was also recently promoted.    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  Both Chaplain and Paula Myers could speak to how hard it has been and how much those in need of service don't recognize the shortage of help that exists.  Paula Myers voiced how passionate she is about her work even with the many changes happening in the workforce.    Chaplain offered listening, support, and presence.    11/22/20 1000  Clinical Encounter Type  Visited With Patient  Visit Type Initial

## 2020-11-22 NOTE — Patient Instructions (Signed)
[facility]  Discharge Instructions: Thank you for choosing [facility] to provide your oncology and hematology care.  If you have a lab appointment with the Cancer Center, please come in thru the Main Entrance and check in at the main information desk.  Wear comfortable clothing and clothing appropriate for easy access to any Portacath or PICC line.   We strive to give you quality time with your provider. You may need to reschedule your appointment if you arrive late (15 or more minutes).  Arriving late affects you and other patients whose appointments are after yours.  Also, if you miss three or more appointments without notifying the office, you may be dismissed from the clinic at the provider's discretion.      For prescription refill requests, have your pharmacy contact our office and allow 72 hours for refills to be completed.    Today you received the following: Venofer, return as scheduled.   To help prevent nausea and vomiting after your treatment, we encourage you to take your nausea medication as directed.  BELOW ARE SYMPTOMS THAT SHOULD BE REPORTED IMMEDIATELY: *FEVER GREATER THAN 100.4 F (38 C) OR HIGHER *CHILLS OR SWEATING *NAUSEA AND VOMITING THAT IS NOT CONTROLLED WITH YOUR NAUSEA MEDICATION *UNUSUAL SHORTNESS OF BREATH *UNUSUAL BRUISING OR BLEEDING *URINARY PROBLEMS (pain or burning when urinating, or frequent urination) *BOWEL PROBLEMS (unusual diarrhea, constipation, pain near the anus) TENDERNESS IN MOUTH AND THROAT WITH OR WITHOUT PRESENCE OF ULCERS (sore throat, sores in mouth, or a toothache) UNUSUAL RASH, SWELLING OR PAIN  UNUSUAL VAGINAL DISCHARGE OR ITCHING   Items with * indicate a potential emergency and should be followed up as soon as possible or go to the Emergency Department if any problems should occur.  Please show the CHEMOTHERAPY ALERT CARD or IMMUNOTHERAPY ALERT CARD at check-in to the Emergency Department and triage  nurse.  Should you have questions after your visit or need to cancel or reschedule your appointment, please contact [facility] [phone]  and follow the prompts.  Office hours are 8:00 a.m. to 4:30 p.m. Monday - Friday. Please note that voicemails left after 4:00 p.m. may not be returned until the following business day.  We are closed weekends and major holidays. You have access to a nurse at all times for urgent questions. Please call the main number to the clinic [phone] and follow the prompts.  For any non-urgent questions, you may also contact your provider using MyChart. We now offer e-Visits for anyone 18 and older to request care online for non-urgent symptoms. For details visit [email or web address].   Also download the MyChart app! Go to the app store, search "MyChart", open the app, select [facility], and log in with your MyChart username and password.  Due to Covid, a mask is required upon entering the hospital/clinic. If you do not have a mask, one will be given to you upon arrival. For doctor visits, patients may have 1 support person aged 18 or older with them. For treatment visits, patients cannot have anyone with them due to current Covid guidelines and our immunocompromised population.  

## 2020-11-22 NOTE — Progress Notes (Signed)
Patient tolerated iron infusion with no complaints voiced.  Peripheral IV site clean and dry with good blood return noted before and after infusion.  Band aid applied.  VSS with discharge and left in satisfactory condition with no s/s of distress noted.   

## 2020-12-02 ENCOUNTER — Encounter (HOSPITAL_COMMUNITY): Payer: Self-pay

## 2020-12-02 ENCOUNTER — Inpatient Hospital Stay (HOSPITAL_COMMUNITY): Payer: Commercial Managed Care - PPO | Attending: Physician Assistant

## 2020-12-02 ENCOUNTER — Other Ambulatory Visit: Payer: Self-pay

## 2020-12-02 VITALS — BP 105/58 | HR 81 | Temp 96.8°F | Resp 17

## 2020-12-02 DIAGNOSIS — D500 Iron deficiency anemia secondary to blood loss (chronic): Secondary | ICD-10-CM | POA: Insufficient documentation

## 2020-12-02 DIAGNOSIS — N920 Excessive and frequent menstruation with regular cycle: Secondary | ICD-10-CM | POA: Insufficient documentation

## 2020-12-02 MED ORDER — FAMOTIDINE 20 MG PO TABS
20.0000 mg | ORAL_TABLET | Freq: Once | ORAL | Status: AC
  Administered 2020-12-02: 20 mg via ORAL
  Filled 2020-12-02: qty 1

## 2020-12-02 MED ORDER — SODIUM CHLORIDE 0.9 % IV SOLN: Freq: Once | INTRAVENOUS | Status: AC

## 2020-12-02 MED ORDER — LORATADINE 10 MG PO TABS
10.0000 mg | ORAL_TABLET | Freq: Once | ORAL | Status: AC
  Administered 2020-12-02: 10 mg via ORAL
  Filled 2020-12-02: qty 1

## 2020-12-02 MED ORDER — ACETAMINOPHEN 325 MG PO TABS
650.0000 mg | ORAL_TABLET | Freq: Once | ORAL | Status: AC
  Administered 2020-12-02: 650 mg via ORAL
  Filled 2020-12-02: qty 2

## 2020-12-02 MED ORDER — METHYLPREDNISOLONE SODIUM SUCC 125 MG IJ SOLR
125.0000 mg | Freq: Once | INTRAMUSCULAR | Status: AC
  Administered 2020-12-02: 125 mg via INTRAVENOUS
  Filled 2020-12-02: qty 2

## 2020-12-02 MED ORDER — SODIUM CHLORIDE 0.9 % IV SOLN
300.0000 mg | Freq: Once | INTRAVENOUS | Status: AC
  Administered 2020-12-02: 300 mg via INTRAVENOUS
  Filled 2020-12-02: qty 15

## 2020-12-02 NOTE — Patient Instructions (Signed)
[facility]  Discharge Instructions: Thank you for choosing [facility] to provide your oncology and hematology care.  If you have a lab appointment with the Cancer Center, please come in thru the Main Entrance and check in at the main information desk.  Wear comfortable clothing and clothing appropriate for easy access to any Portacath or PICC line.   We strive to give you quality time with your provider. You may need to reschedule your appointment if you arrive late (15 or more minutes).  Arriving late affects you and other patients whose appointments are after yours.  Also, if you miss three or more appointments without notifying the office, you may be dismissed from the clinic at the provider's discretion.      For prescription refill requests, have your pharmacy contact our office and allow 72 hours for refills to be completed.    Today you received the following: Venofer, return as scheduled.   To help prevent nausea and vomiting after your treatment, we encourage you to take your nausea medication as directed.  BELOW ARE SYMPTOMS THAT SHOULD BE REPORTED IMMEDIATELY: *FEVER GREATER THAN 100.4 F (38 C) OR HIGHER *CHILLS OR SWEATING *NAUSEA AND VOMITING THAT IS NOT CONTROLLED WITH YOUR NAUSEA MEDICATION *UNUSUAL SHORTNESS OF BREATH *UNUSUAL BRUISING OR BLEEDING *URINARY PROBLEMS (pain or burning when urinating, or frequent urination) *BOWEL PROBLEMS (unusual diarrhea, constipation, pain near the anus) TENDERNESS IN MOUTH AND THROAT WITH OR WITHOUT PRESENCE OF ULCERS (sore throat, sores in mouth, or a toothache) UNUSUAL RASH, SWELLING OR PAIN  UNUSUAL VAGINAL DISCHARGE OR ITCHING   Items with * indicate a potential emergency and should be followed up as soon as possible or go to the Emergency Department if any problems should occur.  Please show the CHEMOTHERAPY ALERT CARD or IMMUNOTHERAPY ALERT CARD at check-in to the Emergency Department and triage  nurse.  Should you have questions after your visit or need to cancel or reschedule your appointment, please contact [facility] [phone]  and follow the prompts.  Office hours are 8:00 a.m. to 4:30 p.m. Monday - Friday. Please note that voicemails left after 4:00 p.m. may not be returned until the following business day.  We are closed weekends and major holidays. You have access to a nurse at all times for urgent questions. Please call the main number to the clinic [phone] and follow the prompts.  For any non-urgent questions, you may also contact your provider using MyChart. We now offer e-Visits for anyone 18 and older to request care online for non-urgent symptoms. For details visit [email or web address].   Also download the MyChart app! Go to the app store, search "MyChart", open the app, select [facility], and log in with your MyChart username and password.  Due to Covid, a mask is required upon entering the hospital/clinic. If you do not have a mask, one will be given to you upon arrival. For doctor visits, patients may have 1 support person aged 18 or older with them. For treatment visits, patients cannot have anyone with them due to current Covid guidelines and our immunocompromised population.  

## 2020-12-02 NOTE — Progress Notes (Signed)
Patient tolerated iron infusion with no complaints voiced.  Peripheral IV site clean and dry with good blood return noted before and after infusion.  Band aid applied.  VSS with discharge and left in satisfactory condition with no s/s of distress noted.   

## 2020-12-09 ENCOUNTER — Other Ambulatory Visit: Payer: Self-pay

## 2020-12-09 ENCOUNTER — Inpatient Hospital Stay (HOSPITAL_COMMUNITY): Payer: Commercial Managed Care - PPO

## 2020-12-09 VITALS — BP 102/69 | HR 73 | Temp 97.1°F | Resp 18

## 2020-12-09 DIAGNOSIS — D500 Iron deficiency anemia secondary to blood loss (chronic): Secondary | ICD-10-CM | POA: Diagnosis not present

## 2020-12-09 MED ORDER — METHYLPREDNISOLONE SODIUM SUCC 125 MG IJ SOLR
125.0000 mg | Freq: Once | INTRAMUSCULAR | Status: AC
  Administered 2020-12-09: 125 mg via INTRAVENOUS
  Filled 2020-12-09: qty 2

## 2020-12-09 MED ORDER — ACETAMINOPHEN 325 MG PO TABS
650.0000 mg | ORAL_TABLET | Freq: Once | ORAL | Status: AC
  Administered 2020-12-09: 650 mg via ORAL
  Filled 2020-12-09: qty 2

## 2020-12-09 MED ORDER — SODIUM CHLORIDE 0.9 % IV SOLN
400.0000 mg | Freq: Once | INTRAVENOUS | Status: AC
  Administered 2020-12-09: 400 mg via INTRAVENOUS
  Filled 2020-12-09: qty 20

## 2020-12-09 MED ORDER — LORATADINE 10 MG PO TABS
10.0000 mg | ORAL_TABLET | Freq: Once | ORAL | Status: AC
  Administered 2020-12-09: 10 mg via ORAL
  Filled 2020-12-09: qty 1

## 2020-12-09 MED ORDER — SODIUM CHLORIDE 0.9 % IV SOLN: Freq: Once | INTRAVENOUS | Status: AC

## 2020-12-09 MED ORDER — FAMOTIDINE 20 MG PO TABS
20.0000 mg | ORAL_TABLET | Freq: Once | ORAL | Status: AC
  Administered 2020-12-09: 20 mg via ORAL
  Filled 2020-12-09: qty 1

## 2020-12-09 NOTE — Progress Notes (Signed)
Pt presents today for Venofer IV iron infusion per provider's order. Vital signs stable and pt voiced no new complaints at this time.  Peripheral IV started with good blood return pre and post infusion.  Venofer given today per MD orders. Tolerated infusion without adverse affects. Vital signs stable. No complaints at this time. Discharged from clinic ambulatory in stable condition. Alert and oriented x 3. F/U with [facility] as scheduled.    

## 2020-12-09 NOTE — Patient Instructions (Signed)
[facility]  Discharge Instructions: Thank you for choosing [facility] to provide your oncology and hematology care.  If you have a lab appointment with the Cancer Center, please come in thru the Main Entrance and check in at the main information desk.  Wear comfortable clothing and clothing appropriate for easy access to any Portacath or PICC line.   We strive to give you quality time with your provider. You may need to reschedule your appointment if you arrive late (15 or more minutes).  Arriving late affects you and other patients whose appointments are after yours.  Also, if you miss three or more appointments without notifying the office, you may be dismissed from the clinic at the provider's discretion.      For prescription refill requests, have your pharmacy contact our office and allow 72 hours for refills to be completed.    Today you received the following chemotherapy and/or immunotherapy agents Venofer IV iron infusion.    BELOW ARE SYMPTOMS THAT SHOULD BE REPORTED IMMEDIATELY: *FEVER GREATER THAN 100.4 F (38 C) OR HIGHER *CHILLS OR SWEATING *NAUSEA AND VOMITING THAT IS NOT CONTROLLED WITH YOUR NAUSEA MEDICATION *UNUSUAL SHORTNESS OF BREATH *UNUSUAL BRUISING OR BLEEDING *URINARY PROBLEMS (pain or burning when urinating, or frequent urination) *BOWEL PROBLEMS (unusual diarrhea, constipation, pain near the anus) TENDERNESS IN MOUTH AND THROAT WITH OR WITHOUT PRESENCE OF ULCERS (sore throat, sores in mouth, or a toothache) UNUSUAL RASH, SWELLING OR PAIN  UNUSUAL VAGINAL DISCHARGE OR ITCHING   Items with * indicate a potential emergency and should be followed up as soon as possible or go to the Emergency Department if any problems should occur.  Please show the CHEMOTHERAPY ALERT CARD or IMMUNOTHERAPY ALERT CARD at check-in to the Emergency Department and triage nurse.  Should you have questions after your visit or need to cancel or reschedule your  appointment, please contact [facility] [phone]  and follow the prompts.  Office hours are 8:00 a.m. to 4:30 p.m. Monday - Friday. Please note that voicemails left after 4:00 p.m. may not be returned until the following business day.  We are closed weekends and major holidays. You have access to a nurse at all times for urgent questions. Please call the main number to the clinic [phone] and follow the prompts.  For any non-urgent questions, you may also contact your provider using MyChart. We now offer e-Visits for anyone 18 and older to request care online for non-urgent symptoms. For details visit [email or web address].   Also download the MyChart app! Go to the app store, search "MyChart", open the app, select [facility], and log in with your MyChart username and password.  Due to Covid, a mask is required upon entering the hospital/clinic. If you do not have a mask, one will be given to you upon arrival. For doctor visits, patients may have 1 support person aged 18 or older with them. For treatment visits, patients cannot have anyone with them due to current Covid guidelines and our immunocompromised population.  

## 2021-01-01 LAB — T4, FREE: Free T4: 1.28 ng/dL (ref 0.82–1.77)

## 2021-01-01 LAB — T3, FREE: T3, Free: 3.5 pg/mL (ref 2.0–4.4)

## 2021-01-01 LAB — TSH: TSH: <0.005 u[IU]/mL — ABNORMAL LOW (ref 0.450–4.500)

## 2021-01-03 ENCOUNTER — Other Ambulatory Visit: Payer: Self-pay

## 2021-01-03 ENCOUNTER — Encounter: Payer: Self-pay | Admitting: Nurse Practitioner

## 2021-01-03 ENCOUNTER — Ambulatory Visit (INDEPENDENT_AMBULATORY_CARE_PROVIDER_SITE_OTHER): Payer: Commercial Managed Care - PPO | Admitting: Nurse Practitioner

## 2021-01-03 VITALS — BP 115/78 | HR 73 | Ht 65.0 in | Wt 241.4 lb

## 2021-01-03 DIAGNOSIS — Z8349 Family history of other endocrine, nutritional and metabolic diseases: Secondary | ICD-10-CM | POA: Diagnosis not present

## 2021-01-03 DIAGNOSIS — E0590 Thyrotoxicosis, unspecified without thyrotoxic crisis or storm: Secondary | ICD-10-CM

## 2021-01-03 DIAGNOSIS — Z833 Family history of diabetes mellitus: Secondary | ICD-10-CM

## 2021-01-03 DIAGNOSIS — E0500 Thyrotoxicosis with diffuse goiter without thyrotoxic crisis or storm: Secondary | ICD-10-CM | POA: Diagnosis not present

## 2021-01-03 NOTE — Progress Notes (Signed)
01/03/2021     Endocrinology Follow Up Note    Subjective:    Patient ID: Paula Myers, female    DOB: 1986-02-20, PCP Noreene Larsson, NP.   Past Medical History:  Diagnosis Date   Anemia    prior pregnancies   Anxiety    Depression    Dysrhythmia    "skipping" HR at beginning of pregnancy   H/O cervical polypectomy    Heartburn during pregnancy    History of cesarean section 08/22/2014   Hypertension 2005   while in labor   Iron deficiency anemia due to chronic blood loss 11/21/2020   Kidney stone    Palpitation    SVT (supraventricular tachycardia) (HCC)    SVT (supraventricular tachycardia) (HCC)    Tachycardia     Past Surgical History:  Procedure Laterality Date   CESAREAN SECTION     CESAREAN SECTION WITH BILATERAL TUBAL LIGATION Bilateral 08/22/2014   Procedure: CESAREAN SECTION WITH BILATERAL TUBAL LIGATION;  Surgeon: Sanjuana Kava, MD;  Location: [facility];  Service: Obstetrics;  Laterality: Bilateral;   ELBOW SURGERY     Left    Social History   Socioeconomic History   Marital status: Married    Spouse name: Not on file   Number of children: 3   Years of education: Not on file   Highest education level: Not on file  Occupational History   Occupation: [employer]    Comment: Dispatcher  Tobacco Use   Smoking status: Every Day    Packs/day: 0.25    Types: Cigarettes   Smokeless tobacco: Never  Vaping Use   Vaping Use: Former  Substance and Sexual Activity   Alcohol use: Not Currently    Comment: maybe 1-2 per year   Drug use: No   Sexual activity: Yes    Birth control/protection: Surgical  Other Topics Concern   Not on file  Social History Narrative   Not on file   Social Determinants of Health   Financial Resource Strain: Low Risk    Difficulty of Paying Living Expenses: Not very hard  Food Insecurity: No Food Insecurity   Worried About Charity fundraiser in the Last Year: Never true   Ran Out of Food in the  Last Year: Never true  Transportation Needs: No Transportation Needs   Lack of Transportation (Medical): No   Lack of Transportation (Non-Medical): No  Physical Activity: Sufficiently Active   Days of Exercise per Week: 4 days   Minutes of Exercise per Session: 60 min  Stress: No Stress Concern Present   Feeling of Stress : Not at all  Social Connections: Moderately Isolated   Frequency of Communication with Friends and Family: More than three times a week   Frequency of Social Gatherings with Friends and Family: More than three times a week   Attends Religious Services: Never   Marine scientist or Organizations: No   Attends Music therapist: Never   Marital Status: Married    Family History  Problem Relation Age of Onset   Hypertension Mother    Diabetes Mother    Diabetes Brother    Diabetes Maternal Grandmother    Hypertension Maternal Grandmother    Heart disease Maternal Grandmother    Cancer Paternal Grandmother    Cancer Paternal Grandfather     Outpatient Encounter Medications as of 01/03/2021  Medication Sig   ferrous sulfate 325 (65 FE) MG tablet Take 1 tablet (325  mg total) by mouth daily. (Patient not taking: Reported on 01/03/2021)   No facility-administered encounter medications on file as of 01/03/2021.    ALLERGIES: Allergies  Allergen Reactions   Levaquin [Levofloxacin In D5w] Anaphylaxis   Levofloxacin Anaphylaxis   Pertussis Vaccines Swelling    T-Dap injection caused localized swelling of arm. Has had Tetanus before without problems.   Tetanus-Diphth-Acell Pertussis Swelling    VACCINATION STATUS: Immunization History  Administered Date(s) Administered   DTaP 09/09/1983, 11/18/1983, 02/03/1984, 01/25/1985, 10/29/1988   Hepatitis B 12/09/1994, 01/14/1995, 06/16/1995   IPV 09/09/1983, 11/18/1983, 02/03/1984, 01/25/1985   Influenza,inj,Quad PF,6+ Mos 11/07/2020   Influenza-Unspecified 12/18/2002, 01/23/2014   MMR 10/28/1984,  12/26/2004   Moderna Sars-Covid-2 Vaccination 12/19/2019, 01/16/2020   Tdap 12/26/2004     HPI  Paula Myers is 35 y.o. female who presents today with a medical history as above. she is being seen in follow up after being seen in consultation for hyperthyroidism requested by Gray, Joseph M, NP.  she has been dealing with symptoms of anxiety, irritability, insomnia, tremors, palpitations, hair loss for "a few months." These symptoms are progressively worsening and troubling to her.  her most recent thyroid labs revealed suppressed TSH of < 0.005 and elevated FT4 of 1.84 and elevated FT3 of 5.1 on 10/07/20. she denies dysphagia, choking, shortness of breath, no recent voice change.    she does family history of thyroid dysfunction in her grandmother (Graves disease) and mother (MNG requiring thyroidectomy), but denies family hx of thyroid cancer. she denies personal history of goiter. she is not on any anti-thyroid medications nor on any thyroid hormone supplements. She does intermittently use of Biotin containing supplements but has not taken it in a few weeks.   She underwent RAI ablation for Graves disease on 11/15/20.   Review of systems  Constitutional: + Minimally fluctuating body weight, current Body mass index is 39.21 kg/m., no fatigue, + subjective hyperthermia, no subjective hypothermia Eyes: no blurry vision, no xerophthalmia ENT: no sore throat, no nodules palpated in throat, no dysphagia/odynophagia, no hoarseness Cardiovascular: no chest pain, no shortness of breath, + intermittent palpitations, no leg swelling Respiratory: no cough, no shortness of breath Gastrointestinal: no nausea/vomiting/diarrhea Musculoskeletal: no muscle/joint aches Skin: no rashes, no hyperemia Neurological: + tremors, no numbness, no tingling, no dizziness Psychiatric: no depression, + anxiety   Objective:    BP 115/78   Pulse 73   Ht 5' 5" (1.651 m)   Wt 241 lb 6.4 oz (109.5 kg)   BMI  40.17 kg/m   Wt Readings from Last 3 Encounters:  01/03/21 241 lb 6.4 oz (109.5 kg)  11/21/20 236 lb 1.6 oz (107.1 kg)  11/07/20 235 lb (106.6 kg)     BP Readings from Last 3 Encounters:  01/03/21 115/78  12/09/20 102/69  12/02/20 (!) 105/58                        Physical Exam- Limited  Constitutional:  Body mass index is 40.17 kg/m. , not in acute distress, normal state of mind Eyes:  EOMI, no exophthalmos Neck: Supple Cardiovascular: RRR, no murmurs, rubs, or gallops, no edema Respiratory: Adequate breathing efforts, no crackles, rales, rhonchi, or wheezing Musculoskeletal: no gross deformities, strength intact in all four extremities, no gross restriction of joint movements Skin:  no rashes, no hyperemia Neurological: no tremor with outstretched hands   CMP     Component Value Date/Time   NA 138 11/05/2020 0907     K 4.2 11/05/2020 0907   CL 103 11/05/2020 0907   CO2 20 11/05/2020 0907   GLUCOSE 81 11/05/2020 0907   GLUCOSE 90 10/27/2020 0736   BUN 7 11/05/2020 0907   CREATININE 0.65 11/05/2020 0907   CALCIUM 9.5 11/05/2020 0907   PROT 7.0 11/05/2020 0907   ALBUMIN 4.3 11/05/2020 0907   AST 17 11/05/2020 0907   ALT 22 11/05/2020 0907   ALKPHOS 101 11/05/2020 0907   BILITOT 0.2 11/05/2020 0907   GFRNONAA >60 10/27/2020 0736   GFRAA >60 09/22/2019 1938     CBC    Component Value Date/Time   WBC 10.7 11/05/2020 0907   WBC 11.9 (H) 10/27/2020 0736   RBC 5.37 (H) 11/05/2020 0907   RBC 5.30 (H) 10/27/2020 0736   HGB 12.4 11/05/2020 0907   HCT 39.5 11/05/2020 0907   PLT 355 11/05/2020 0907   MCV 74 (L) 11/05/2020 0907   MCH 23.1 (L) 11/05/2020 0907   MCH 23.2 (L) 10/27/2020 0736   MCHC 31.4 (L) 11/05/2020 0907   MCHC 30.7 10/27/2020 0736   RDW 20.3 (H) 11/05/2020 0907   LYMPHSABS 3.3 (H) 11/05/2020 0907   MONOABS 1.0 01/05/2019 0802   EOSABS 0.1 11/05/2020 0907   BASOSABS 0.1 11/05/2020 0907     Diabetic Labs (most recent): No results found for:  HGBA1C  Lipid Panel     Component Value Date/Time   CHOL 178 11/05/2020 0907   TRIG 101 11/05/2020 0907   HDL 46 11/05/2020 0907   LDLCALC 114 (H) 11/05/2020 0907   LABVLDL 18 11/05/2020 0907     Lab Results  Component Value Date   TSH <0.005 (L) 12/31/2020   TSH <0.005 (L) 10/07/2020   FREET4 1.28 12/31/2020   FREET4 1.84 (H) 10/07/2020     Uptake and Scan from 10/17/20 CLINICAL DATA:  Hyperthyroidism, TSH 0.450   EXAM: THYROID SCAN AND UPTAKE - 4 AND 24 HOURS   TECHNIQUE: Following oral administration of I-123 capsule, anterior planar imaging was acquired at 24 hours. Thyroid uptake was calculated with a thyroid probe at 4-6 hours and 24 hours.   RADIOPHARMACEUTICALS:  436 uCi I-123 sodium iodide p.o.   COMPARISON:  None   FINDINGS: Homogeneous tracer distribution in both thyroid lobes.   No focal areas of increased or decreased tracer localization.   4 hour I-123 uptake = 14.1% (normal 5-20%)   24 hour I-123 uptake = 31.1% (normal 10-30%)   IMPRESSION: Normal thyroid scan.   Minimally elevated 24 hour radio iodine uptake.   In the setting of hyperthyroidism, findings suggest Graves disease     Electronically Signed   By: Mark  Boles M.Myers.   On: 10/17/2020 15:47   Results for Paula Myers, Paula Myers (MRN [id number]) as of 01/03/2021 08:25  Ref. Range 10/07/2020 10:01 12/31/2020 08:49  TSH Latest Ref Range: 0.450 - 4.500 uIU/mL <0.005 (L) <0.005 (L)  Triiodothyronine,Free,Serum Latest Ref Range: 2.0 - 4.4 pg/mL 5.1 (H) 3.5  T4,Free(Direct) Latest Ref Range: 0.82 - 1.77 ng/dL 1.84 (H) 1.28     Assessment & Plan:   1. Hyperthyroidism- r/t Grave's disease  she is being seen at a kind request of Gray, Joseph M, NP.  Her uptake and scan showed mildly elevated 24-hr uptake at 31% which is consistent with hyperthyroidism r/t Graves disease.  Her TFTs also showed suppressed TSH and elevated FT3 and FT4 levels.   -She underwent RAI ablation on 11/15/20.  Her  repeat thyroid function tests show improvement, but not yet   at a level where thyroid hormone replacement is needed at this time.  Will recheck TFTs in 8 weeks to determine when thyroid hormone replacement is necessary.      -Patient is advised to maintain close follow up with Gray, Joseph M, NP for primary care needs.     I spent 20 minutes in the care of the patient today including review of labs from Thyroid Function, CMP, and other relevant labs ; imaging/biopsy records (current and previous including abstractions from other facilities); face-to-face time discussing  her lab results and symptoms, medications doses, her options of short and long term treatment based on the latest standards of care / guidelines;   and documenting the encounter.  Paula Myers  participated in the discussions, expressed understanding, and voiced agreement with the above plans.  All questions were answered to her satisfaction. she is encouraged to contact clinic should she have any questions or concerns prior to her return visit.    Follow up plan: Return in about 8 weeks (around 02/28/2021) for Thyroid follow up, Previsit labs.   Thank you for involving me in the care of this pleasant patient, and I will continue to update you with her progress.     , FNP-BC [facility] [address] [place], [place] [address] Phone: [phone] Fax: [phone]  01/03/2021, 8:31 AM   

## 2021-02-03 ENCOUNTER — Encounter: Payer: Self-pay | Admitting: Nurse Practitioner

## 2021-02-05 ENCOUNTER — Other Ambulatory Visit: Payer: Self-pay | Admitting: Nurse Practitioner

## 2021-02-05 DIAGNOSIS — E890 Postprocedural hypothyroidism: Secondary | ICD-10-CM

## 2021-02-05 LAB — T4, FREE: Free T4: 0.13 ng/dL — ABNORMAL LOW (ref 0.82–1.77)

## 2021-02-05 LAB — TSH: TSH: 47.3 u[IU]/mL — ABNORMAL HIGH (ref 0.450–4.500)

## 2021-02-05 LAB — T3, FREE: T3, Free: 0.5 pg/mL — ABNORMAL LOW (ref 2.0–4.4)

## 2021-02-05 MED ORDER — LEVOTHYROXINE SODIUM 100 MCG PO TABS: 100.0000 ug | ORAL_TABLET | Freq: Every day | ORAL | 1 refills | Status: DC

## 2021-02-05 NOTE — Progress Notes (Signed)
Can you move patients appointment out 3 months?  I spoke with her on the phone about starting medications and we wont need to see her again until then.  Thanks!

## 2021-02-05 NOTE — Progress Notes (Signed)
I called patient to let her know that her thyroid labs are now suggestive of hypothyroidism (an expected finding after RAI ablation).  She is now ready to start thyroid hormone replacement.  I discussed and initiated Levothyroxine 100 mcg po daily before breakfast.  Will recheck TFTs in 3 months and adjust dose further if needed.

## 2021-02-25 ENCOUNTER — Other Ambulatory Visit: Payer: Self-pay

## 2021-02-25 ENCOUNTER — Inpatient Hospital Stay (HOSPITAL_COMMUNITY): Payer: Commercial Managed Care - PPO | Attending: Hematology

## 2021-02-25 DIAGNOSIS — D500 Iron deficiency anemia secondary to blood loss (chronic): Secondary | ICD-10-CM | POA: Insufficient documentation

## 2021-02-25 LAB — CBC WITH DIFFERENTIAL/PLATELET
Abs Immature Granulocytes: 0.03 10*3/uL (ref 0.00–0.07)
Basophils Absolute: 0.1 10*3/uL (ref 0.0–0.1)
Basophils Relative: 1 %
Eosinophils Absolute: 0.2 10*3/uL (ref 0.0–0.5)
Eosinophils Relative: 2 %
HCT: 40.4 % (ref 36.0–46.0)
Hemoglobin: 13.2 g/dL (ref 12.0–15.0)
Immature Granulocytes: 0 %
Lymphocytes Relative: 33 %
Lymphs Abs: 3 10*3/uL (ref 0.7–4.0)
MCH: 28.5 pg (ref 26.0–34.0)
MCHC: 32.7 g/dL (ref 30.0–36.0)
MCV: 87.3 fL (ref 80.0–100.0)
Monocytes Absolute: 0.4 10*3/uL (ref 0.1–1.0)
Monocytes Relative: 5 %
Neutro Abs: 5.2 10*3/uL (ref 1.7–7.7)
Neutrophils Relative %: 59 %
Platelets: 267 10*3/uL (ref 150–400)
RBC: 4.63 MIL/uL (ref 3.87–5.11)
RDW: 16.4 % — ABNORMAL HIGH (ref 11.5–15.5)
WBC: 8.9 10*3/uL (ref 4.0–10.5)
nRBC: 0 % (ref 0.0–0.2)

## 2021-02-25 LAB — IRON AND TIBC
Iron: 34 ug/dL (ref 28–170)
Saturation Ratios: 9 % — ABNORMAL LOW (ref 10.4–31.8)
TIBC: 386 ug/dL (ref 250–450)
UIBC: 352 ug/dL

## 2021-02-25 LAB — FERRITIN: Ferritin: 10 ng/mL — ABNORMAL LOW (ref 11–307)

## 2021-02-26 ENCOUNTER — Other Ambulatory Visit: Payer: Self-pay | Admitting: Nurse Practitioner

## 2021-02-26 ENCOUNTER — Encounter: Payer: Self-pay | Admitting: Nurse Practitioner

## 2021-02-26 DIAGNOSIS — N920 Excessive and frequent menstruation with regular cycle: Secondary | ICD-10-CM

## 2021-02-26 NOTE — Telephone Encounter (Signed)
I sent in referral.

## 2021-02-26 NOTE — Telephone Encounter (Signed)
Patient aware.

## 2021-02-28 ENCOUNTER — Ambulatory Visit: Payer: Commercial Managed Care - PPO | Admitting: Nurse Practitioner

## 2021-03-03 NOTE — Progress Notes (Signed)
Virtual Visit via Telephone Note [facility]  I connected with Paula Myers  on 03/04/21 at  2:25 PM by telephone and verified that I am speaking with the correct person using two identifiers.  Location: Patient: Home Provider: [facility]   I discussed the limitations, risks, security and privacy concerns of performing an evaluation and management service by telephone and the availability of in person appointments. I also discussed with the patient that there may be a patient responsible charge related to this service. The patient expressed understanding and agreed to proceed.   HISTORY OF PRESENT ILLNESS: 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  She was seen for initial consultation by Dr. Ellin Saba and Rojelio Brenner PA-C on 11/21/2020.  She received IV Venofer 300 mg x 3 doses from 11/22/2020 through 12/09/2020.  At today's visit, she reports feeling somewhat poorly due to current upper respiratory infection with complaints of head congestion, shortness of breath, and cough.  Otherwise, she denies any recent hospitalizations, surgeries, or changes in her baseline health status.  Regarding her iron deficiency anemia, she initially felt better after her IV iron and had improved energy for about 1 month.  However, she is still having extremely heavy periods, even worse than usual, and has had recurrent fatigue and symptoms of iron deficiency for the past 6+ weeks.  She reports that she is also undergoing treatment for her hypothyroidism.  Pica has resolved.  No restless legs.  She admits to headache, fatigue, lethargy, palpitations, dyspnea on exertion, and chest discomfort.  She continues to take iron pill at home.  She is scheduled for GYN follow-up later this month.  She reports 50% energy and 100% appetite.  She is maintaining stable weight at this time.   OBSERVATIONS/OBJECTIVE: Review of  Systems  Constitutional:  Positive for malaise/fatigue (Recurrent fatigue from iron deficiency, energy 50%). Negative for chills, diaphoresis, fever and weight loss.  HENT:  Positive for congestion.   Respiratory:  Positive for cough and shortness of breath.   Cardiovascular:  Positive for palpitations (Associated with chest discomfort, improved after IV iron). Negative for chest pain.  Gastrointestinal:  Negative for abdominal pain, blood in stool, melena, nausea and vomiting.  Genitourinary:        Menorrhagia with heavy bleeding x 4-6 days with total length of period about 10 days.  Reports that she will feel ultra sized tampon within 30 minutes and passes large clots.  Neurological:  Positive for dizziness and headaches.  Psychiatric/Behavioral:  The patient is nervous/anxious.     PHYSICAL EXAM (per limitations of virtual telephone visit): The patient is alert and oriented x 3, exhibiting adequate mentation, good mood, and ability to speak in full sentences and execute sound judgement.   ASSESSMENT & PLAN: 1.  Symptomatic iron deficiency without anemia, secondary to chronic blood loss from menorrhagia - Review of past labs shows longstanding iron deficiency with ferritin of 6 in June 2018.  Most recent iron panel (11/05/2020) shows ferritin 14, serum iron 28, and iron saturation 8%.  Her most recent hemoglobin (11/05/2020) is normal at 12.4, but she has had mild anemia in the past with Hgb as low as 9.9 (01/05/2019). - She reports very heavy menstrual cycles, is following with gynecology and considering uterine ablation   - No gross GI hemorrhage - denies bright red blood per rectum and melena    - Takes daily ferrous sulfate, but without improvement  in her iron levels - She received Venofer 300 mg x 3 from 11/22/2020 through 12/09/2020 - Symptoms after IV iron were initially improved, but she had recurrent fatigue/lethargy/headaches after about 1 month - Most recent labs (02/25/2021): Hgb  13.2/MCV 87.3, ferritin 10, iron saturation 9%. - Anemia and microcytosis have resolved, but she has persistent iron deficiency. - Suspect chronic iron deficiency and intermittent anemia secondary to blood loss from menorrhagia.   - PLAN: Recommend IV iron with Venofer 300 mg x 3 (we will premedicate with steroids due to history of anaphylactic reaction to Levaquin). - We will repeat CBC and iron panel in 2 months. - She is recommended to continue to follow with gynecology, and to consider uterine ablation if recommended by her GYN.  2.  Other history - PMH: Hypothyroidism/Graves' disease with radioablation of thyroid on 11/15/2020; hypertension and SVT during pregnancy - SOCIAL: Lives with her husband and 3 children.  Works night shift as Industrial/product designer. - SUBSTANCE: Smoked intermittently since age 44, currently smokes less than 0.5 packs/day.  Rare social alcohol consumption.  No history of illicit drug use. - FAMILY: Paternal grandmother with ovarian cancer.  Paternal grandfather with thyroid cancer.  Maternal grandmother with unspecified anemia.   FOLLOW UP INSTRUCTIONS: IV Venofer 300 mg x 3 Labs in 2 months Phone visit the day after labs    I discussed the assessment and treatment plan with the patient. The patient was provided an opportunity to ask questions and all were answered. The patient agreed with the plan and demonstrated an understanding of the instructions.   The patient was advised to call back or seek an in-person evaluation if the symptoms worsen or if the condition fails to improve as anticipated.  I provided 18 minutes of non-face-to-face time during this encounter.   Carnella Guadalajara, PA-C 03/04/2021 2:47 PM

## 2021-03-04 ENCOUNTER — Inpatient Hospital Stay (HOSPITAL_COMMUNITY): Payer: Commercial Managed Care - PPO | Attending: Physician Assistant | Admitting: Physician Assistant

## 2021-03-04 ENCOUNTER — Other Ambulatory Visit: Payer: Self-pay

## 2021-03-04 ENCOUNTER — Encounter (HOSPITAL_COMMUNITY): Payer: Self-pay | Admitting: Hematology

## 2021-03-04 DIAGNOSIS — E0590 Thyrotoxicosis, unspecified without thyrotoxic crisis or storm: Secondary | ICD-10-CM

## 2021-03-04 DIAGNOSIS — N920 Excessive and frequent menstruation with regular cycle: Secondary | ICD-10-CM | POA: Diagnosis not present

## 2021-03-04 DIAGNOSIS — D500 Iron deficiency anemia secondary to blood loss (chronic): Secondary | ICD-10-CM | POA: Diagnosis not present

## 2021-03-04 DIAGNOSIS — I10 Essential (primary) hypertension: Secondary | ICD-10-CM

## 2021-03-10 ENCOUNTER — Inpatient Hospital Stay (HOSPITAL_COMMUNITY): Payer: Commercial Managed Care - PPO

## 2021-03-10 ENCOUNTER — Other Ambulatory Visit: Payer: Self-pay

## 2021-03-10 VITALS — BP 140/90 | HR 75 | Temp 97.8°F | Resp 18 | Ht 65.0 in | Wt 258.4 lb

## 2021-03-10 DIAGNOSIS — D500 Iron deficiency anemia secondary to blood loss (chronic): Secondary | ICD-10-CM

## 2021-03-10 DIAGNOSIS — N920 Excessive and frequent menstruation with regular cycle: Secondary | ICD-10-CM | POA: Diagnosis present

## 2021-03-10 MED ORDER — FAMOTIDINE 20 MG PO TABS
20.0000 mg | ORAL_TABLET | Freq: Once | ORAL | Status: AC
  Administered 2021-03-10: 20 mg via ORAL
  Filled 2021-03-10: qty 1

## 2021-03-10 MED ORDER — SODIUM CHLORIDE 0.9 % IV SOLN
300.0000 mg | Freq: Once | INTRAVENOUS | Status: AC
  Administered 2021-03-10: 300 mg via INTRAVENOUS
  Filled 2021-03-10: qty 300

## 2021-03-10 MED ORDER — LORATADINE 10 MG PO TABS
10.0000 mg | ORAL_TABLET | Freq: Once | ORAL | Status: AC
  Administered 2021-03-10: 10 mg via ORAL
  Filled 2021-03-10: qty 1

## 2021-03-10 MED ORDER — ACETAMINOPHEN 325 MG PO TABS
650.0000 mg | ORAL_TABLET | Freq: Once | ORAL | Status: AC
  Administered 2021-03-10: 650 mg via ORAL
  Filled 2021-03-10: qty 2

## 2021-03-10 MED ORDER — FAMOTIDINE 20 MG IN NS 100 ML IVPB: 20.0000 mg | Freq: Once | INTRAVENOUS | Status: DC

## 2021-03-10 MED ORDER — METHYLPREDNISOLONE SODIUM SUCC 125 MG IJ SOLR
125.0000 mg | Freq: Once | INTRAMUSCULAR | Status: AC
  Administered 2021-03-10: 125 mg via INTRAVENOUS
  Filled 2021-03-10: qty 2

## 2021-03-10 MED ORDER — SODIUM CHLORIDE 0.9 % IV SOLN: Freq: Once | INTRAVENOUS | Status: AC

## 2021-03-10 NOTE — Patient Instructions (Signed)
[facility]  Discharge Instructions: Thank you for choosing [facility] to provide your oncology and hematology care.  If you have a lab appointment with the Cancer Center, please come in thru the Main Entrance and check in at the main information desk.  Wear comfortable clothing and clothing appropriate for easy access to any Portacath or PICC line.   We strive to give you quality time with your provider. You may need to reschedule your appointment if you arrive late (15 or more minutes).  Arriving late affects you and other patients whose appointments are after yours.  Also, if you miss three or more appointments without notifying the office, you may be dismissed from the clinic at the provider's discretion.      For prescription refill requests, have your pharmacy contact our office and allow 72 hours for refills to be completed.    Today you received the following chemotherapy and/or immunotherapy agents Venofer infusion      To help prevent nausea and vomiting after your treatment, we encourage you to take your nausea medication as directed.  BELOW ARE SYMPTOMS THAT SHOULD BE REPORTED IMMEDIATELY: *FEVER GREATER THAN 100.4 F (38 C) OR HIGHER *CHILLS OR SWEATING *NAUSEA AND VOMITING THAT IS NOT CONTROLLED WITH YOUR NAUSEA MEDICATION *UNUSUAL SHORTNESS OF BREATH *UNUSUAL BRUISING OR BLEEDING *URINARY PROBLEMS (pain or burning when urinating, or frequent urination) *BOWEL PROBLEMS (unusual diarrhea, constipation, pain near the anus) TENDERNESS IN MOUTH AND THROAT WITH OR WITHOUT PRESENCE OF ULCERS (sore throat, sores in mouth, or a toothache) UNUSUAL RASH, SWELLING OR PAIN  UNUSUAL VAGINAL DISCHARGE OR ITCHING   Items with * indicate a potential emergency and should be followed up as soon as possible or go to the Emergency Department if any problems should occur.  Please show the CHEMOTHERAPY ALERT CARD or IMMUNOTHERAPY ALERT CARD at check-in to the Emergency  Department and triage nurse.  Should you have questions after your visit or need to cancel or reschedule your appointment, please contact [facility] [phone]  and follow the prompts.  Office hours are 8:00 a.m. to 4:30 p.m. Monday - Friday. Please note that voicemails left after 4:00 p.m. may not be returned until the following business day.  We are closed weekends and major holidays. You have access to a nurse at all times for urgent questions. Please call the main number to the clinic [phone] and follow the prompts.  For any non-urgent questions, you may also contact your provider using MyChart. We now offer e-Visits for anyone 18 and older to request care online for non-urgent symptoms. For details visit [email or web address].   Also download the MyChart app! Go to the app store, search "MyChart", open the app, select [place], and log in with your MyChart username and password.  Due to Covid, a mask is required upon entering the hospital/clinic. If you do not have a mask, one will be given to you upon arrival. For doctor visits, patients may have 1 support person aged 18 or older with them. For treatment visits, patients cannot have anyone with them due to current Covid guidelines and our immunocompromised population.  

## 2021-03-10 NOTE — Progress Notes (Signed)
Patient presents today for Venofer infusion per providers order.  Vital signs WNL.  Patient has no new complaints at this time.    Peripheral IV started and blood return noted pre infusion.  Pre medication given.  Approximately one hour into infusion 0950, patient started complaining of left sided chest pain, (constant ache/pressure), no radiation or SOB.  Venofer stopped and saline infusing.  Vitals were WNL, BP 145/79, HR 85, O2 100, RR 18.  0955 Dr. Ellin Saba in the room with the patient, recommended that the Venofer be held and patient monitored for 15-20 minutes until the chest pain improves.  Dr. Ellin Saba also recommended that her iron infusions be changed to [facility] from this point forward.  Message sent to [employer], and [employer]	[place].   1026 patients pain is resolved BP is 140/90, HR 75, O2 99.  Advised patient that if she has a recurrence of chest pain to go to the Emergency Department.  Patient to expressed understanding.

## 2021-03-10 NOTE — Progress Notes (Signed)
Hypersensitivity Reaction note  Date of event: 03/10/21 Time of event: 0950 Generic name of drug involved: Venofer Name of provider notified of the hypersensitivity reaction: Marice Potter MD Was agent that likely caused hypersensitivity reaction added to Allergies List within EMR? yes Chain of events including reaction signs/symptoms, treatment administered, and outcome (e.g., drug resumed; drug discontinued; sent to Emergency Department; etc.) Approximately one hour into infusion 0950, patient started complaining of left sided chest pain, (constant ache/pressure), no radiation or SOB.  Venofer stopped and saline infusing.  Vitals were WNL, BP 145/79, HR 85, O2 100, RR 18.  0955 Dr. Ellin Saba in the room with the patient, recommended that the Venofer be held and patient monitored for 15-20 minutes until the chest pain improves.  Dr. Ellin Saba also recommended that her iron infusions be changed to [facility] from this point forward.  Message sent to [employer], and [employer]	[place].    1026 patients pain is resolved BP is 140/90, HR 75, O2 99.  Advised patient that if she has a recurrence of chest pain to go to the Emergency Department.  Patient to expressed understanding.     Worthy Rancher, RN 03/10/2021 3:15 PM

## 2021-03-14 ENCOUNTER — Encounter: Payer: Self-pay | Admitting: Adult Health

## 2021-03-14 ENCOUNTER — Other Ambulatory Visit (HOSPITAL_COMMUNITY)
Admission: RE | Admit: 2021-03-14 | Discharge: 2021-03-14 | Disposition: A | Discharge status: Home / Self Care | Payer: Commercial Managed Care - PPO | Source: Ambulatory Visit | Attending: Adult Health | Admitting: Adult Health

## 2021-03-14 ENCOUNTER — Ambulatory Visit (INDEPENDENT_AMBULATORY_CARE_PROVIDER_SITE_OTHER): Payer: Commercial Managed Care - PPO | Admitting: Adult Health

## 2021-03-14 ENCOUNTER — Other Ambulatory Visit: Payer: Self-pay

## 2021-03-14 VITALS — BP 147/104 | HR 93 | Ht 65.0 in | Wt 260.0 lb

## 2021-03-14 DIAGNOSIS — N920 Excessive and frequent menstruation with regular cycle: Secondary | ICD-10-CM

## 2021-03-14 DIAGNOSIS — R030 Elevated blood-pressure reading, without diagnosis of hypertension: Secondary | ICD-10-CM

## 2021-03-14 DIAGNOSIS — D500 Iron deficiency anemia secondary to blood loss (chronic): Secondary | ICD-10-CM | POA: Diagnosis not present

## 2021-03-14 DIAGNOSIS — Z124 Encounter for screening for malignant neoplasm of cervix: Secondary | ICD-10-CM | POA: Diagnosis not present

## 2021-03-14 MED ORDER — HYDROCHLOROTHIAZIDE 12.5 MG PO CAPS: 12.5000 mg | ORAL_CAPSULE | Freq: Every day | ORAL | 2 refills | Status: DC

## 2021-03-14 NOTE — Progress Notes (Signed)
Patient ID: Paula Myers, female   DOB: 1985-09-14, 36 y.o.   MRN: [id number] History of Present Illness: Paula Myers is a 36 year old white female,married, [id number] in complaining of heavy periods, esp last 2. They are lasting 10-13 days with 4-5 days being heavier, where changes pad and tampon every 30-45 minutes and every 2 hours on lighter days. She has had anemia and had iron infusion. She has had thyroid problems,Graves Disease, and is now on synthroid. She needs pap.   Current Medications, Allergies, Past Medical History, Past Surgical History, Family History and Social History were reviewed in [employer] record.     Review of Systems: +heavy periods Denies any pain with sex   Physical Exam:BP (!) 147/104 (BP Location: Right Arm, Patient Position: Sitting, Cuff Size: Large)    Pulse 93    Ht 5\' 5"  (1.651 m)    Wt 260 lb (117.9 kg)    LMP 02/24/2021    BMI 43.27 kg/m   General:  Well developed, well nourished, no acute distress Skin:  Warm and dry Lungs; Clear to auscultation bilaterally Cardiovascular: Regular rate and rhythm Pelvic:  External genitalia is normal in appearance, no lesions.  The vagina is normal in appearance. Urethra has no lesions or masses. The cervix is bulbous. Pap with GC/CHL, and HR HPV genotyping performed.  Uterus is felt to be normal size, shape, and contour.  No adnexal masses or tenderness noted.Bladder is non tender, no masses felt. Extremities/musculoskeletal:  No swelling or varicosities noted, no clubbing or cyanosis Psych:  No mood changes, alert and cooperative,seems happy AA is 1 Fall risk is low Depression screen [facility] 2/9 03/14/2021 11/21/2020 11/07/2020  Decreased Interest 0 0 0  Down, Depressed, Hopeless 1 0 0  PHQ - 2 Score 1 0 0  Altered sleeping 1 - -  Tired, decreased energy 3 - -  Change in appetite 2 - -  Feeling bad or failure about yourself  0 - -  Trouble concentrating 1 - -  Moving slowly or  fidgety/restless 0 - -  Suicidal thoughts 0 - -  PHQ-9 Score 8 - -  Difficult doing work/chores - - -   She declines meds at this time  GAD 7 : Generalized Anxiety Score 03/14/2021 08/28/2016 07/31/2016  Nervous, Anxious, on Edge 1 2 3   Control/stop worrying 0 2 3  Worry too much - different things 0 2 3  Trouble relaxing 1 2 3   Restless 0 2 2  Easily annoyed or irritable 1 2 3   Afraid - awful might happen 0 2 3  Total GAD 7 Score 3 14 20   Anxiety Difficulty - Very difficult Very difficult      Upstream - 03/14/21 1011       Pregnancy Intention Screening   Does the patient want to become pregnant in the next year? No    Does the patient's partner want to become pregnant in the next year? No    Would the patient like to discuss contraceptive options today? No      Contraception Wrap Up   Current Method Female Sterilization    End Method Female Sterilization    Contraception Counseling Provided No            Examination chaperoned by Diona Fanti CMA   Impression and Plan: 1. Routine cervical smear Pap sent Pap in 3 years if normal  2. Menorrhagia with regular cycle Will get GYN [place] to assess uterus Discussed megace, IUD  and ablation and handout given for endometrial ablation If bleeding starts before [place] and is heavy call me, can try megace then  She says she does not do well with birth control  3. Iron deficiency anemia due to chronic blood loss   4. Elevated BP without diagnosis of hypertension Will rx Microzide and recheck BP in 4 weeks

## 2021-03-17 ENCOUNTER — Inpatient Hospital Stay (HOSPITAL_COMMUNITY): Payer: Commercial Managed Care - PPO

## 2021-03-17 ENCOUNTER — Encounter (HOSPITAL_COMMUNITY): Payer: Self-pay

## 2021-03-17 ENCOUNTER — Other Ambulatory Visit: Payer: Self-pay

## 2021-03-17 VITALS — BP 144/90 | HR 89 | Temp 97.8°F | Resp 18

## 2021-03-17 DIAGNOSIS — D500 Iron deficiency anemia secondary to blood loss (chronic): Secondary | ICD-10-CM | POA: Diagnosis not present

## 2021-03-17 MED ORDER — ACETAMINOPHEN 325 MG PO TABS
650.0000 mg | ORAL_TABLET | Freq: Once | ORAL | Status: AC
  Administered 2021-03-17: 650 mg via ORAL
  Filled 2021-03-17: qty 2

## 2021-03-17 MED ORDER — LORATADINE 10 MG PO TABS
10.0000 mg | ORAL_TABLET | Freq: Once | ORAL | Status: AC
  Administered 2021-03-17: 10 mg via ORAL
  Filled 2021-03-17: qty 1

## 2021-03-17 MED ORDER — FAMOTIDINE IN NACL 20-0.9 MG/50ML-% IV SOLN
20.0000 mg | Freq: Once | INTRAVENOUS | Status: AC
  Administered 2021-03-17: 20 mg via INTRAVENOUS
  Filled 2021-03-17: qty 50

## 2021-03-17 MED ORDER — SODIUM CHLORIDE 0.9 % IV SOLN: Freq: Once | INTRAVENOUS | Status: AC

## 2021-03-17 MED ORDER — SODIUM CHLORIDE 0.9 % IV SOLN
510.0000 mg | Freq: Once | INTRAVENOUS | Status: AC
  Administered 2021-03-17: 510 mg via INTRAVENOUS
  Filled 2021-03-17: qty 510

## 2021-03-17 MED ORDER — METHYLPREDNISOLONE SODIUM SUCC 125 MG IJ SOLR
125.0000 mg | Freq: Once | INTRAMUSCULAR | Status: AC
  Administered 2021-03-17: 125 mg via INTRAVENOUS
  Filled 2021-03-17: qty 2

## 2021-03-17 NOTE — Patient Instructions (Signed)
[facility]  Discharge Instructions: Thank you for choosing [facility] to provide your oncology and hematology care.  If you have a lab appointment with the Cancer Center, please come in thru the Main Entrance and check in at the main information desk.  Wear comfortable clothing and clothing appropriate for easy access to any Portacath or PICC line.   We strive to give you quality time with your provider. You may need to reschedule your appointment if you arrive late (15 or more minutes).  Arriving late affects you and other patients whose appointments are after yours.  Also, if you miss three or more appointments without notifying the office, you may be dismissed from the clinic at the providers discretion.      For prescription refill requests, have your pharmacy contact our office and allow 72 hours for refills to be completed.    Today you received the following Feraheme infusion, return as scheduled.   To help prevent nausea and vomiting after your treatment, we encourage you to take your nausea medication as directed.  BELOW ARE SYMPTOMS THAT SHOULD BE REPORTED IMMEDIATELY: *FEVER GREATER THAN 100.4 F (38 C) OR HIGHER *CHILLS OR SWEATING *NAUSEA AND VOMITING THAT IS NOT CONTROLLED WITH YOUR NAUSEA MEDICATION *UNUSUAL SHORTNESS OF BREATH *UNUSUAL BRUISING OR BLEEDING *URINARY PROBLEMS (pain or burning when urinating, or frequent urination) *BOWEL PROBLEMS (unusual diarrhea, constipation, pain near the anus) TENDERNESS IN MOUTH AND THROAT WITH OR WITHOUT PRESENCE OF ULCERS (sore throat, sores in mouth, or a toothache) UNUSUAL RASH, SWELLING OR PAIN  UNUSUAL VAGINAL DISCHARGE OR ITCHING   Items with * indicate a potential emergency and should be followed up as soon as possible or go to the Emergency Department if any problems should occur.  Please show the CHEMOTHERAPY ALERT CARD or IMMUNOTHERAPY ALERT CARD at check-in to the Emergency Department and  triage nurse.  Should you have questions after your visit or need to cancel or reschedule your appointment, please contact [facility] [phone]  and follow the prompts.  Office hours are 8:00 a.m. to 4:30 p.m. Monday - Friday. Please note that voicemails left after 4:00 p.m. may not be returned until the following business day.  We are closed weekends and major holidays. You have access to a nurse at all times for urgent questions. Please call the main number to the clinic [phone] and follow the prompts.  For any non-urgent questions, you may also contact your provider using MyChart. We now offer e-Visits for anyone 45 and older to request care online for non-urgent symptoms. For details visit [email or web address].   Also download the MyChart app! Go to the app store, search "MyChart", open the app, select [facility] [place], and log in with your MyChart username and password.  Due to Covid, a mask is required upon entering the hospital/clinic. If you do not have a mask, one will be given to you upon arrival. For doctor visits, patients may have 1 support person aged 73 or older with them. For treatment visits, patients cannot have anyone with them due to current Covid guidelines and our immunocompromised population.

## 2021-03-17 NOTE — Progress Notes (Signed)
Patient with allergic event to Venofer.  Received orders to modify treatment:  Feraheme 510 mg weekly x 2 with the following premedication: pepcid 20 mg IVPB + Loratidine 10 mg po + solu-medrol 125 mg IVpush + tylenol 650 mg po - all x 1 as premedication.  T.ORojelio Brenner, PA-C/Brenson Hartman Yetta Barre, PharmD

## 2021-03-17 NOTE — Progress Notes (Signed)
Patient tolerated iron infusion with no complaints voiced.  Peripheral IV site clean and dry with good blood return noted before and after infusion.  Band aid applied.  VSS with discharge and left in satisfactory condition with no s/s of distress noted.   

## 2021-03-18 LAB — CYTOLOGY - PAP
Chlamydia: NEGATIVE
Comment: NEGATIVE
Comment: NEGATIVE
Comment: NORMAL
Diagnosis: NEGATIVE
High risk HPV: NEGATIVE
Neisseria Gonorrhea: NEGATIVE

## 2021-03-24 ENCOUNTER — Other Ambulatory Visit: Payer: Self-pay

## 2021-03-24 ENCOUNTER — Inpatient Hospital Stay (HOSPITAL_COMMUNITY): Payer: Commercial Managed Care - PPO

## 2021-03-24 ENCOUNTER — Encounter (HOSPITAL_COMMUNITY): Payer: Self-pay

## 2021-03-24 VITALS — BP 127/91 | HR 83 | Temp 98.0°F | Resp 18 | Ht 65.0 in

## 2021-03-24 DIAGNOSIS — D500 Iron deficiency anemia secondary to blood loss (chronic): Secondary | ICD-10-CM | POA: Diagnosis not present

## 2021-03-24 MED ORDER — SODIUM CHLORIDE 0.9 % IV SOLN
510.0000 mg | Freq: Once | INTRAVENOUS | Status: AC
  Administered 2021-03-24: 510 mg via INTRAVENOUS
  Filled 2021-03-24: qty 510

## 2021-03-24 MED ORDER — METHYLPREDNISOLONE SODIUM SUCC 125 MG IJ SOLR
125.0000 mg | Freq: Once | INTRAMUSCULAR | Status: AC
  Administered 2021-03-24: 125 mg via INTRAVENOUS
  Filled 2021-03-24: qty 2

## 2021-03-24 MED ORDER — LORATADINE 10 MG PO TABS
10.0000 mg | ORAL_TABLET | Freq: Once | ORAL | Status: AC
  Administered 2021-03-24: 10 mg via ORAL
  Filled 2021-03-24: qty 1

## 2021-03-24 MED ORDER — ACETAMINOPHEN 325 MG PO TABS
650.0000 mg | ORAL_TABLET | Freq: Once | ORAL | Status: AC
  Administered 2021-03-24: 650 mg via ORAL
  Filled 2021-03-24: qty 2

## 2021-03-24 MED ORDER — FAMOTIDINE IN NACL 20-0.9 MG/50ML-% IV SOLN
20.0000 mg | Freq: Once | INTRAVENOUS | Status: AC
  Administered 2021-03-24: 20 mg via INTRAVENOUS
  Filled 2021-03-24: qty 50

## 2021-03-24 MED ORDER — SODIUM CHLORIDE 0.9 % IV SOLN: Freq: Once | INTRAVENOUS | Status: AC

## 2021-03-24 NOTE — Progress Notes (Signed)
Patient presents today for iron infusion.  Patient is in satisfactory condition with no new complaints voiced.  Vital signs are stable.  We will proceed with treatment per MD orders.   Patient tolerated treatment well with no complaints voiced.  Patient left ambulatory in stable condition.  Vital signs stable at discharge.  Follow up as scheduled.    

## 2021-03-24 NOTE — Patient Instructions (Signed)
[facility]  Discharge Instructions: Thank you for choosing [facility] to provide your oncology and hematology care.  If you have a lab appointment with the Cancer Center, please come in thru the Main Entrance and check in at the main information desk.  We strive to give you quality time with your provider. You may need to reschedule your appointment if you arrive late (15 or more minutes).  Arriving late affects you and other patients whose appointments are after yours.  Also, if you miss three or more appointments without notifying the office, you may be dismissed from the clinic at the provider's discretion.      For prescription refill requests, have your pharmacy contact our office and allow 72 hours for refills to be completed.     To help prevent nausea and vomiting after your treatment, we encourage you to take your nausea medication as directed.  BELOW ARE SYMPTOMS THAT SHOULD BE REPORTED IMMEDIATELY: *FEVER GREATER THAN 100.4 F (38 C) OR HIGHER *CHILLS OR SWEATING *NAUSEA AND VOMITING THAT IS NOT CONTROLLED WITH YOUR NAUSEA MEDICATION *UNUSUAL SHORTNESS OF BREATH *UNUSUAL BRUISING OR BLEEDING *URINARY PROBLEMS (pain or burning when urinating, or frequent urination) *BOWEL PROBLEMS (unusual diarrhea, constipation, pain near the anus) TENDERNESS IN MOUTH AND THROAT WITH OR WITHOUT PRESENCE OF ULCERS (sore throat, sores in mouth, or a toothache) UNUSUAL RASH, SWELLING OR PAIN  UNUSUAL VAGINAL DISCHARGE OR ITCHING   Items with * indicate a potential emergency and should be followed up as soon as possible or go to the Emergency Department if any problems should occur.  Should you have questions after your visit or need to cancel or reschedule your appointment, please contact [facility] [phone]  and follow the prompts.  Office hours are 8:00 a.m. to 4:30 p.m. Monday - Friday. Please note that voicemails left after 4:00 p.m. may not be  returned until the following business day.  We are closed weekends and major holidays. You have access to a nurse at all times for urgent questions. Please call the main number to the clinic [phone] and follow the prompts.  For any non-urgent questions, you may also contact your provider using MyChart. We now offer e-Visits for anyone 18 and older to request care online for non-urgent symptoms. For details visit [email or web address].   Also download the MyChart app! Go to the app store, search "MyChart", open the app, select [place], and log in with your MyChart username and password.  Due to Covid, a mask is required upon entering the hospital/clinic. If you do not have a mask, one will be given to you upon arrival. For doctor visits, patients may have 1 support person aged 18 or older with them. For treatment visits, patients cannot have anyone with them due to current Covid guidelines and our immunocompromised population.  

## 2021-03-26 ENCOUNTER — Telehealth: Payer: Self-pay | Admitting: Adult Health

## 2021-03-26 MED ORDER — MEGESTROL ACETATE 40 MG PO TABS: ORAL_TABLET | ORAL | 1 refills | Status: DC

## 2021-03-26 NOTE — Addendum Note (Signed)
Addended by: Cyril Mourning A on: 03/26/2021 11:36 AM   Modules accepted: Orders

## 2021-03-26 NOTE — Telephone Encounter (Signed)
Patient is calling stating that she was told if her bleeding started again and it was heavy that you would call something in to help stop or slow down the bleeding. Pharmacy [facility] [address].

## 2021-03-26 NOTE — Telephone Encounter (Signed)
Pt aware that Megace sent in to stop bleeding and [place] 04/03/21.

## 2021-03-30 ENCOUNTER — Encounter: Payer: Self-pay | Admitting: Nurse Practitioner

## 2021-03-30 DIAGNOSIS — E890 Postprocedural hypothyroidism: Secondary | ICD-10-CM

## 2021-04-01 ENCOUNTER — Other Ambulatory Visit: Payer: Self-pay | Admitting: Nurse Practitioner

## 2021-04-01 LAB — T4, FREE: Free T4: 0.95 ng/dL (ref 0.82–1.77)

## 2021-04-01 LAB — TSH: TSH: 19.9 u[IU]/mL — ABNORMAL HIGH (ref 0.450–4.500)

## 2021-04-01 MED ORDER — LEVOTHYROXINE SODIUM 125 MCG PO TABS: 125.0000 ug | ORAL_TABLET | Freq: Every day | ORAL | 1 refills | Status: DC

## 2021-04-01 NOTE — Progress Notes (Signed)
Her thyroid function has improved since we started her on the thyroid hormone replacement, but she still needs more.  Lets increase her Levothyroxine to 125 mcg po daily before breakfast.  I will send in updated script to pharmacy on file.  That could be contributing to her symptoms she has recently been experiencing.

## 2021-04-03 ENCOUNTER — Other Ambulatory Visit: Payer: Self-pay

## 2021-04-03 ENCOUNTER — Ambulatory Visit (INDEPENDENT_AMBULATORY_CARE_PROVIDER_SITE_OTHER): Payer: Commercial Managed Care - PPO

## 2021-04-03 DIAGNOSIS — N920 Excessive and frequent menstruation with regular cycle: Secondary | ICD-10-CM

## 2021-04-03 NOTE — Progress Notes (Signed)
PELVIC US TA/TV: heterogeneous anteverted uterus with thickened anterior myometrium,multiple simple nabothian cysts,EEC 7.3 mm,normal ovaries,no free fluid,no pain during ultrasound

## 2021-04-10 ENCOUNTER — Other Ambulatory Visit: Payer: Self-pay

## 2021-04-10 ENCOUNTER — Ambulatory Visit (INDEPENDENT_AMBULATORY_CARE_PROVIDER_SITE_OTHER): Payer: Commercial Managed Care - PPO | Admitting: Adult Health

## 2021-04-10 ENCOUNTER — Encounter: Payer: Self-pay | Admitting: Adult Health

## 2021-04-10 VITALS — BP 116/81 | HR 74 | Ht 65.0 in | Wt 255.0 lb

## 2021-04-10 DIAGNOSIS — I10 Essential (primary) hypertension: Secondary | ICD-10-CM | POA: Diagnosis not present

## 2021-04-10 DIAGNOSIS — N920 Excessive and frequent menstruation with regular cycle: Secondary | ICD-10-CM

## 2021-04-10 NOTE — Progress Notes (Signed)
°  Subjective:     Patient ID: Paula Myers, female   DOB: [birth year], 36 y.o.   MRN: [id number]  HPI Paula Myers is a 36 year old white female, married, in for BP and check and review [place]. She was started on Microzide 03/24/21, and had [place] for menorrhagia 04/04/21. She works at [employer]. Lab Results  Component Value Date   DIAGPAP  03/14/2021    - Negative for intraepithelial lesion or malignancy (NILM)   HPVHIGH Negative 03/14/2021   PCP is F. Paseda NP   Review of Systems Bleeding stopped with megace, but she had to stop it due to emotional upset, teary  Reviewed past medical,surgical, social and family history. Reviewed medications and allergies.     Objective:   Physical Exam BP 116/81 (BP Location: Left Arm, Patient Position: Sitting, Cuff Size: Large)    Pulse 74    Ht 5\' 5"  (1.651 m)    Wt 255 lb (115.7 kg)    LMP 03/24/2021    BMI 42.43 kg/m     Skin warm and dry. Lungs: clear to ausculation bilaterally. Cardiovascular: regular rate and rhythm.  03/26/2021 shows: normal ovaries and Uterus, EEC 7.3 mm   Upstream - 04/10/21 0951       Pregnancy Intention Screening   Does the patient want to become pregnant in the next year? No    Does the patient's partner want to become pregnant in the next year? No    Would the patient like to discuss contraceptive options today? No      Contraception Wrap Up   Current Method Female Sterilization    End Method Female Sterilization    Contraception Counseling Provided No             Assessment:     1. Menorrhagia with regular cycle She has reviewed handout on ablation and wants to proceed with that Will get pre op with Dr 06/08/21 04/21/21  2. Hypertension, unspecified type Continue Microzide had refills, BP much better      Plan:     Return 04/21/21 for pre op with Dr 04/23/21 for ablation

## 2021-04-21 ENCOUNTER — Other Ambulatory Visit: Payer: Self-pay

## 2021-04-21 ENCOUNTER — Ambulatory Visit (INDEPENDENT_AMBULATORY_CARE_PROVIDER_SITE_OTHER): Payer: Commercial Managed Care - PPO | Admitting: Obstetrics & Gynecology

## 2021-04-21 ENCOUNTER — Encounter: Payer: Self-pay | Admitting: Obstetrics & Gynecology

## 2021-04-21 VITALS — BP 115/72 | HR 85 | Ht 65.0 in | Wt 253.0 lb

## 2021-04-21 DIAGNOSIS — D500 Iron deficiency anemia secondary to blood loss (chronic): Secondary | ICD-10-CM | POA: Diagnosis not present

## 2021-04-21 DIAGNOSIS — Z98891 History of uterine scar from previous surgery: Secondary | ICD-10-CM

## 2021-04-21 DIAGNOSIS — Z6841 Body Mass Index (BMI) 40.0 and over, adult: Secondary | ICD-10-CM

## 2021-04-21 DIAGNOSIS — N920 Excessive and frequent menstruation with regular cycle: Secondary | ICD-10-CM | POA: Diagnosis not present

## 2021-04-21 IMAGING — CR DG CHEST 2V
2 series · 2 of 2 positions shown · non-contrast
Comparison: March 17, 2018

CLINICAL DATA: Chest tightness and cardiac palpitations

EXAM:
CHEST - 2 VIEW

[w chest pa]
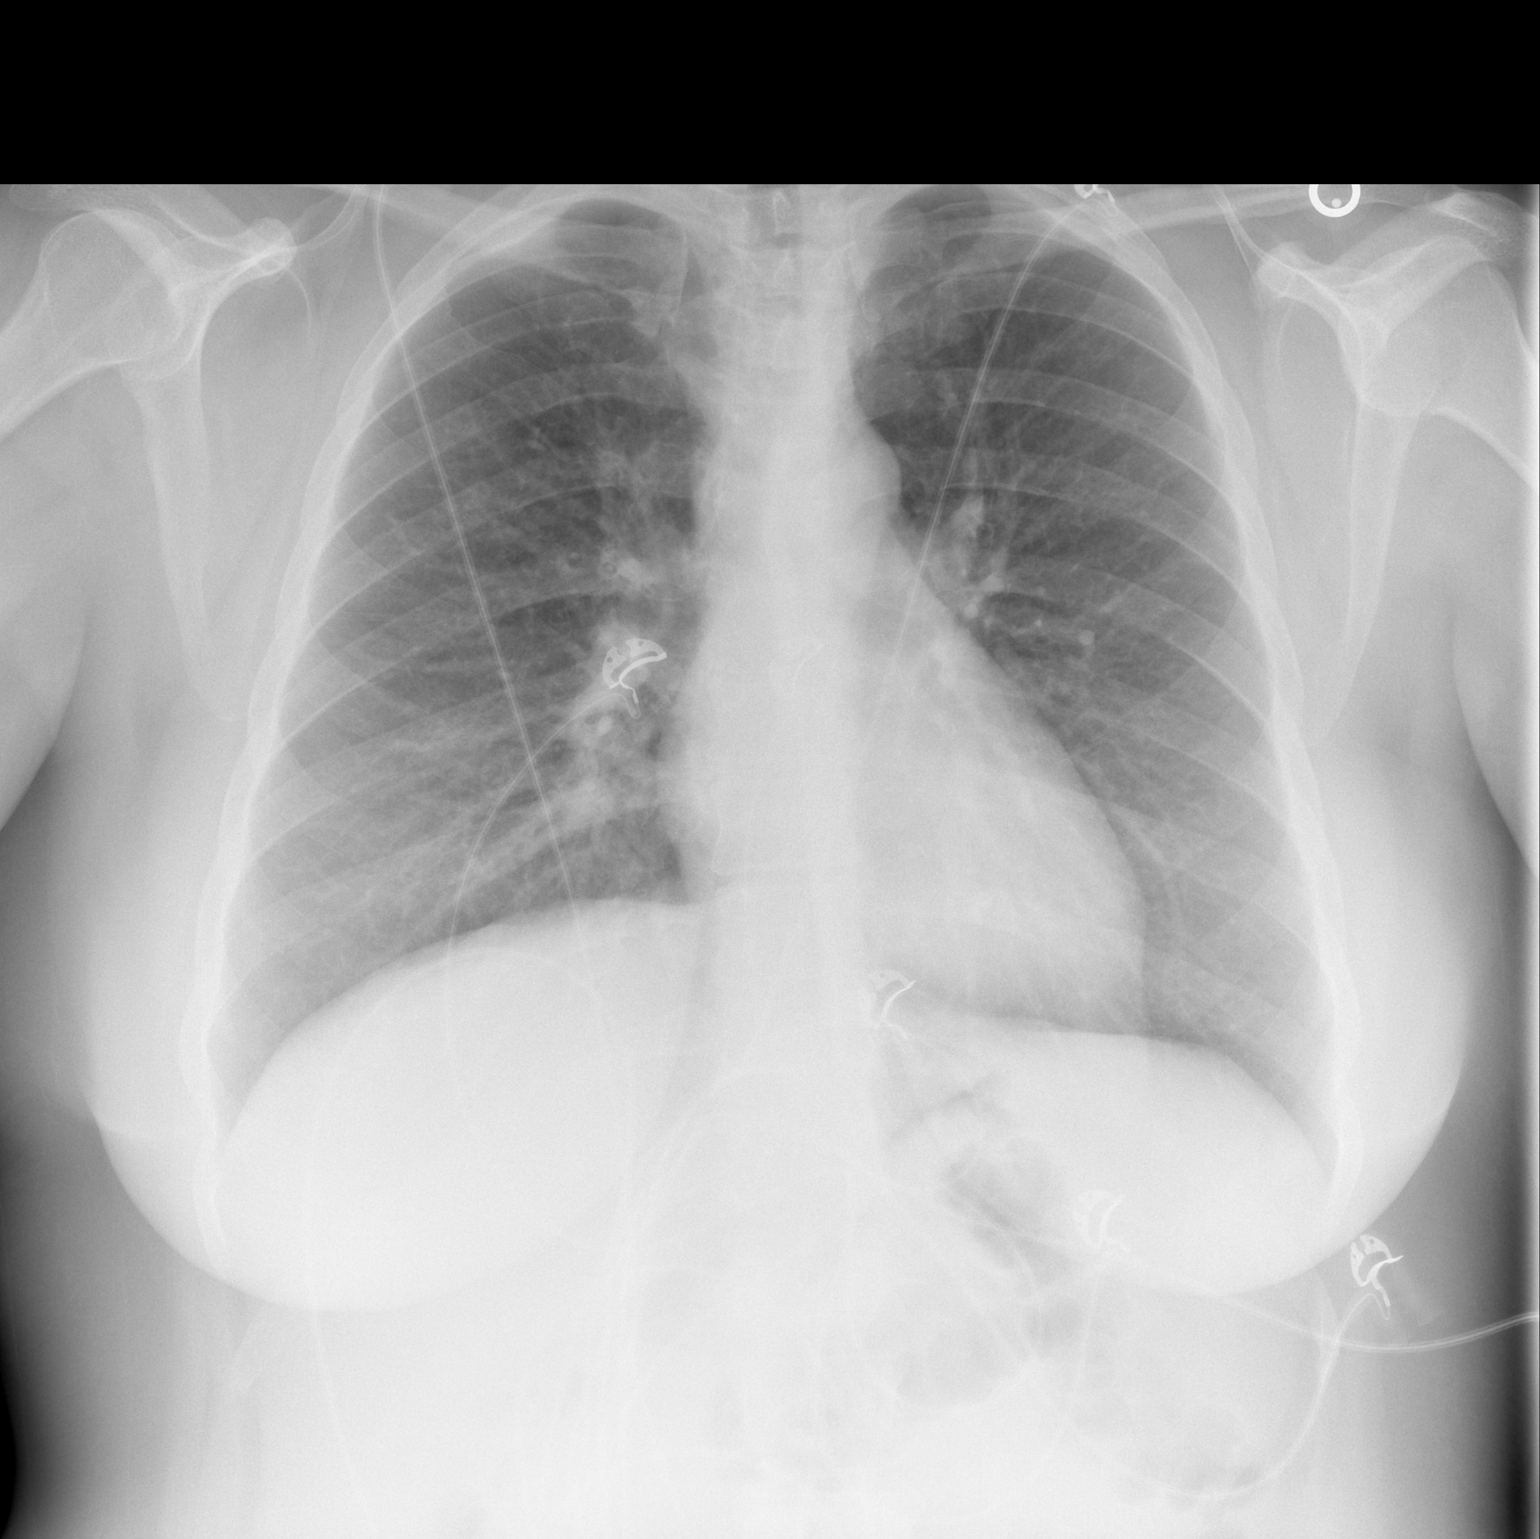

[w chest lat]
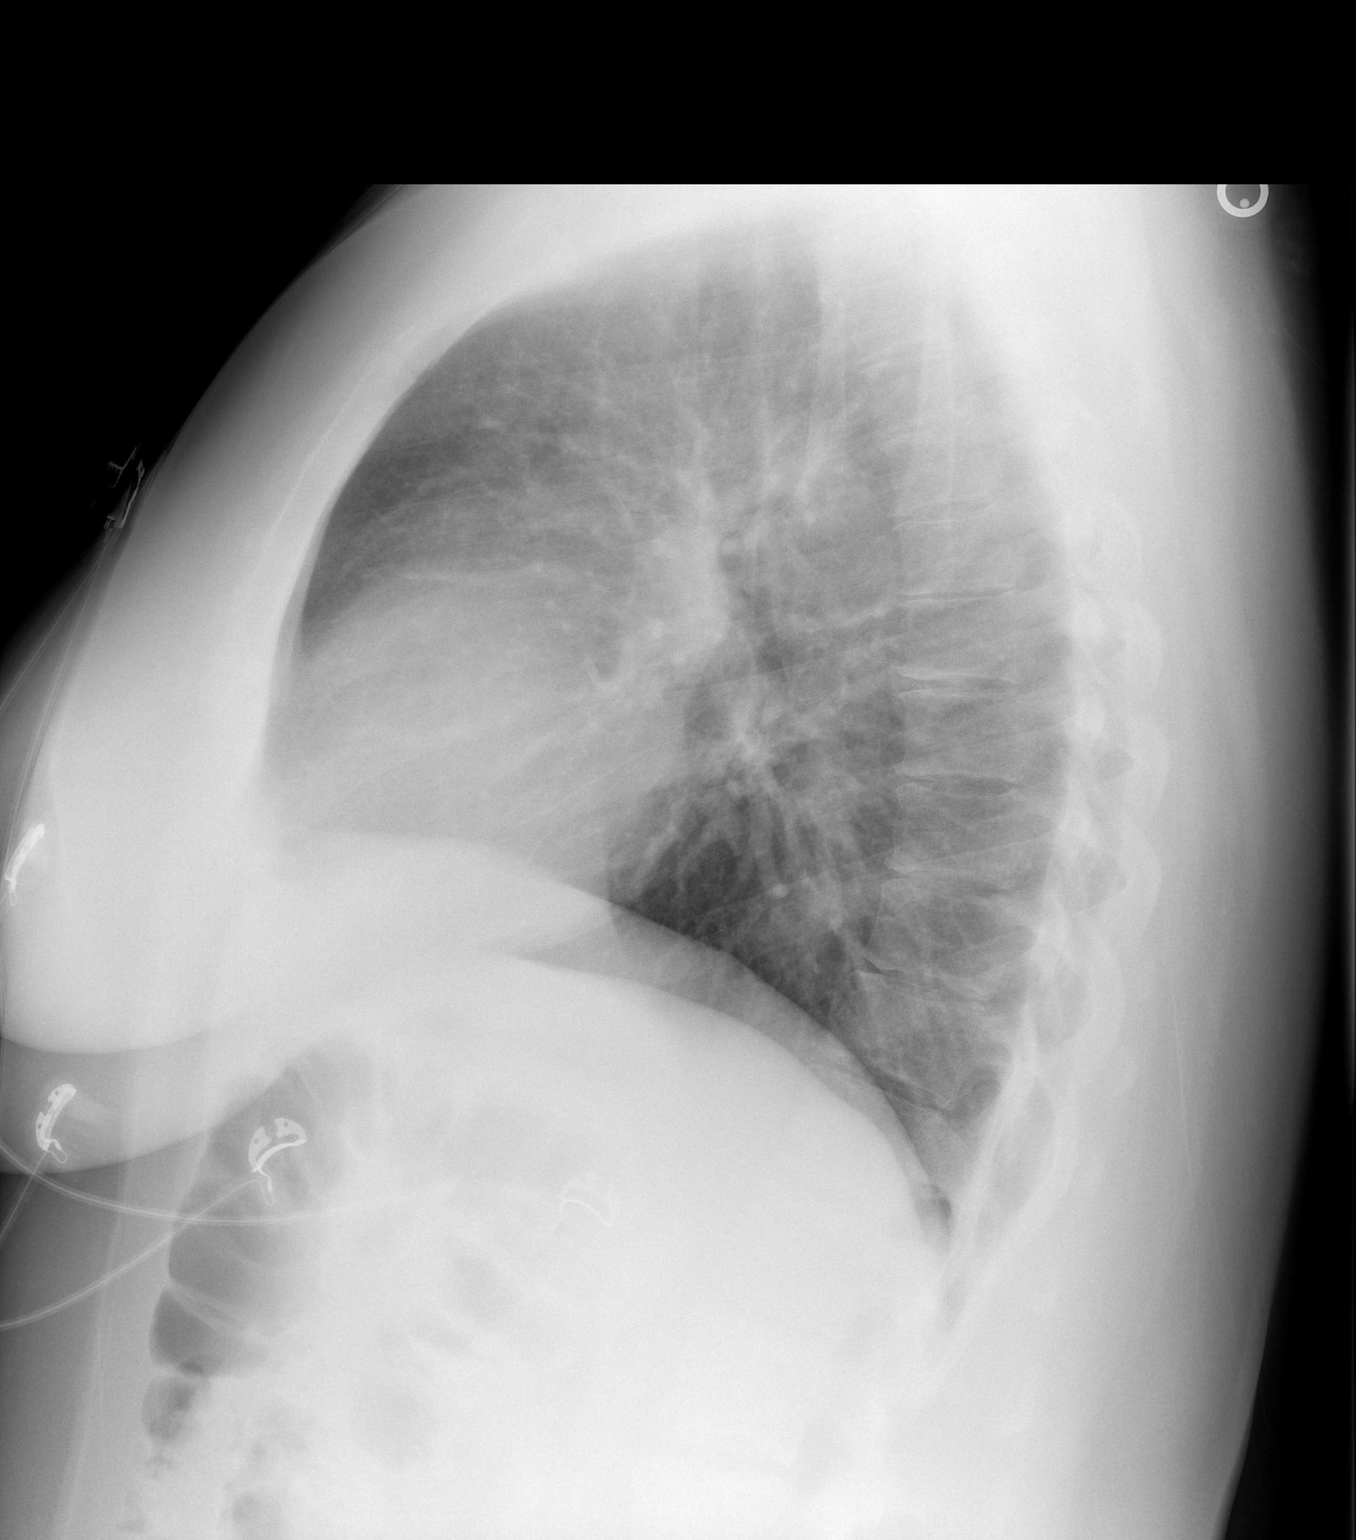

[2 of 2 positions shown; findings below may reference images not displayed]

FINDINGS: Lungs are clear. Heart size and pulmonary vascularity are normal. No
adenopathy. No pneumothorax. No bone lesions.
IMPRESSION: No edema or consolidation.

## 2021-04-21 MED ORDER — TRANEXAMIC ACID 650 MG PO TABS: ORAL_TABLET | ORAL | 1 refills | Status: DC

## 2021-04-21 NOTE — Progress Notes (Signed)
GYN VISIT Patient name: Paula Myers MRN [id number]  Date of birth: 1986-03-01 Chief Complaint:   Pre-op Exam (Discuss Ablation)  History of Present Illness:   Paula Myers is a 36 y.o. [phone]  female being seen today for the following concerns:     AUB/HMB: Menses have always been heavy, but after last pregnancy even worse.  Will wear both a pad and tampon that need to be changed every 1/2 hour.  Menses will last 8 days with 4 super heavy days. Denies intermenstrual bleeding.  Bleeding has led to iron def. Anemia and has required iron transfusion.  Tried birth control pills, ring, and Nexplanon. Noted considerable mood concerns and unable to continue with the medication.  Contraception: tubal ligation  Patient's last menstrual period was 03/24/2021.  Depression screen [facility] 2/9 03/14/2021 11/21/2020 11/07/2020 10/07/2020 10/02/2016  Decreased Interest 0 0 0 0 1  Down, Depressed, Hopeless 1 0 0 0 1  PHQ - 2 Score 1 0 0 0 2  Altered sleeping 1 - - - 1  Tired, decreased energy 3 - - - 2  Change in appetite 2 - - - 2  Feeling bad or failure about yourself  0 - - - 2  Trouble concentrating 1 - - - 1  Moving slowly or fidgety/restless 0 - - - 1  Suicidal thoughts 0 - - - 0  PHQ-9 Score 8 - - - 11  Difficult doing work/chores - - - - Somewhat difficult     Review of Systems:   Pertinent items are noted in HPI Denies fever/chills, dizziness, headaches, visual disturbances, fatigue, shortness of breath, chest pain, abdominal pain, vomiting, bowel movements, urination, or intercourse unless otherwise stated above.  Pertinent History Reviewed:  Reviewed past medical,surgical, social, obstetrical and family history.  Reviewed problem list, medications and allergies. Physical Assessment:   Vitals:   04/21/21 1408  BP: 115/72  Pulse: 85  Weight: 253 lb (114.8 kg)  Height: 5\' 5"  (1.651 m)  Body mass index is 42.1 kg/m.       Physical Examination:   General appearance:  alert, well appearing, and in no distress  Psych: mood appropriate, normal affect  Skin: warm & dry   Cardiovascular: normal heart rate noted  Respiratory: normal respiratory effort, no distress  Abdomen: obese, soft, non-tender   Pelvic: examination not indicated- previously completed by  Extremities: no edema   Pelvic Roseanne Reno (04/02/2021): 6.7cm anteverted uterus- no abnormalities.  Normal ovaries bilaterally  Chaperone: N/A    Assessment & Plan:  1) HMB -discussed all options ranging from TXA, IUD or surgical interventions such as ablation or hysterectomy -Risk/benefits reviewed of each option.  After much discussion pt is leaning towards a more permanent option -Discussed risk/benefit of hysterectomy including but not limited to risk of bleeding, infection and injury to surrounding organs -Discussed concern for possible adhesions due to prior hysterectomy- plan for TAH/BS by mini-lap -discussed hospital expectations and recovery -Questions and concerns were addressed and pt is leaning towards hysterectomy -Referral created to surgical coordinator -Will also send in TXA to help improve bleeding until surgical intervention can be completed  2) Iron def. Anemia -followed by PCP -s/p IV iron transfusion  -prior C-section x 2 -Obesity  Meds ordered this encounter  Medications   tranexamic acid (LYSTEDA) 650 MG TABS tablet    Sig: Take 1-2 tablets up to thee times per day during your period as needed.  MAX 5 days    Dispense:  30 tablet    Refill:  1      Myna Hidalgo, DO Attending Obstetrician & Gynecologist, [facility] for [employer], [facility]

## 2021-04-28 DIAGNOSIS — Z029 Encounter for administrative examinations, unspecified: Secondary | ICD-10-CM

## 2021-05-05 ENCOUNTER — Inpatient Hospital Stay (HOSPITAL_COMMUNITY): Payer: Commercial Managed Care - PPO | Attending: Hematology

## 2021-05-05 DIAGNOSIS — N920 Excessive and frequent menstruation with regular cycle: Secondary | ICD-10-CM | POA: Diagnosis present

## 2021-05-05 DIAGNOSIS — D500 Iron deficiency anemia secondary to blood loss (chronic): Secondary | ICD-10-CM | POA: Insufficient documentation

## 2021-05-05 LAB — CBC WITH DIFFERENTIAL/PLATELET
Abs Immature Granulocytes: 0.03 10*3/uL (ref 0.00–0.07)
Basophils Absolute: 0.1 10*3/uL (ref 0.0–0.1)
Basophils Relative: 1 %
Eosinophils Absolute: 0.2 10*3/uL (ref 0.0–0.5)
Eosinophils Relative: 1 %
HCT: 43.2 % (ref 36.0–46.0)
Hemoglobin: 14.1 g/dL (ref 12.0–15.0)
Immature Granulocytes: 0 %
Lymphocytes Relative: 25 %
Lymphs Abs: 2.7 10*3/uL (ref 0.7–4.0)
MCH: 29.4 pg (ref 26.0–34.0)
MCHC: 32.6 g/dL (ref 30.0–36.0)
MCV: 90.2 fL (ref 80.0–100.0)
Monocytes Absolute: 0.5 10*3/uL (ref 0.1–1.0)
Monocytes Relative: 5 %
Neutro Abs: 7.1 10*3/uL (ref 1.7–7.7)
Neutrophils Relative %: 68 %
Platelets: 281 10*3/uL (ref 150–400)
RBC: 4.79 MIL/uL (ref 3.87–5.11)
RDW: 13.8 % (ref 11.5–15.5)
WBC: 10.6 10*3/uL — ABNORMAL HIGH (ref 4.0–10.5)
nRBC: 0 % (ref 0.0–0.2)

## 2021-05-05 LAB — IRON AND TIBC
Iron: 77 ug/dL (ref 28–170)
Saturation Ratios: 24 % (ref 10.4–31.8)
TIBC: 320 ug/dL (ref 250–450)
UIBC: 243 ug/dL

## 2021-05-05 LAB — FERRITIN: Ferritin: 66 ng/mL (ref 11–307)

## 2021-05-05 NOTE — Progress Notes (Signed)
? ?Virtual Visit via Telephone Note ?[facility] ? ?I connected with Paula Myers  on 05/06/21 at  1:55 PM by telephone and verified that I am speaking with the correct person using two identifiers. ? ?Location: ?Patient: Home ?Provider: [facility] ?  ?I discussed the limitations, risks, security and privacy concerns of performing an evaluation and management service by telephone and the availability of in person appointments. I also discussed with the patient that there may be a patient responsible charge related to this service. The patient expressed understanding and agreed to proceed. ? ? ?HISTORY OF PRESENT ILLNESS: ?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  She was last evaluated via telemedicine visit by Rojelio Brenner PA-C on 03/04/2021. ? ?Patient's most recent IV iron was in January 2023.  She had an episode of chest pain during Venofer infusion on 03/10/2021, and therefore was switched to [facility]  She tolerated Feraheme much better on 03/17/2021 and 03/24/2021. ? ?She reports some improved energy after most recent IV iron, but does continue to have some persistent fatigue with energy about 50%.  She continues to have significant menorrhagia and is scheduled for hysterectomy next week on 05/13/2021.  She denies any pica, restless legs, headaches, chest pain, dyspnea on exertion, lightheadedness, or syncope. ? ?  ?OBSERVATIONS/OBJECTIVE: ?Review of Systems  ?Constitutional:  Positive for malaise/fatigue. Negative for chills, diaphoresis, fever and weight loss.  ?Respiratory:  Negative for cough and shortness of breath.   ?Cardiovascular:  Negative for chest pain and palpitations.  ?Gastrointestinal:  Negative for abdominal pain, blood in stool, melena, nausea and vomiting.  ?Genitourinary:   ?     Menorrhagia  ?Neurological:  Negative for dizziness and headaches.   ? ?PHYSICAL EXAM (per limitations of virtual  telephone visit): The patient is alert and oriented x 3, exhibiting adequate mentation, good mood, and ability to speak in full sentences and execute sound judgement. ? ? ?ASSESSMENT & PLAN: ?1.  Symptomatic iron deficiency without anemia, secondary to chronic blood loss from menorrhagia ?- She reports very heavy menstrual cycles, is following with gynecology and has hysterectomy scheduled for 05/13/2021   ?- No gross GI hemorrhage - denies bright red blood per rectum and melena   ?- Takes daily ferrous sulfate, but without improvement in her iron levels ?- Most recent IV iron in January 2023.  Received Venofer on 03/10/2021 but experienced significant chest pain during infusion despite premedications with IV steroids.  Subsequent infusions were switched to [facility] along with continued steroid premedication.  She received Feraheme x2 on 03/17/2021 and 03/24/2021, which she tolerated well. ?- She notes improved energy after her most recent IV iron. ?- Most recent labs (05/05/2021): Hgb 14.1/MCV 90.2, ferritin 66, iron saturation 24%.  Mild leukocytosis with WBC 10.6 and normal differential. ?- PLAN: No strong indication for IV iron at this time.  Continue daily iron supplementation. ?- If she requires any IV iron in the future, we will premedicate with steroids due to history of anaphylactic reaction to Levaquin and history of adverse reaction to Venofer. ?- We will repeat CBC and iron panel in 2 months to see how she is doing after her hysterectomy (scheduled for March 2023) ? ?2.  Other history ?- PMH: Hypothyroidism/Graves' disease with radioablation of thyroid on 11/15/2020; hypertension and SVT during pregnancy ?- SOCIAL: Lives with her husband and 3 children.  Works night shift as Industrial/product designer. ?- SUBSTANCE: Smoked intermittently since age  17, currently smokes less than 0.5 packs/day.  Rare social alcohol consumption.  No history of illicit drug use. ?- FAMILY: Paternal grandmother with ovarian cancer.  Paternal  grandfather with thyroid cancer.  Maternal grandmother with unspecified anemia. ? ?  ?I discussed the assessment and treatment plan with the patient. The patient was provided an opportunity to ask questions and all were answered. The patient agreed with the plan and demonstrated an understanding of the instructions. ?  ?The patient was advised to call back or seek an in-person evaluation if the symptoms worsen or if the condition fails to improve as anticipated. ? ?I provided 12 minutes of non-face-to-face time during this encounter. ? ? ?Carnella Guadalajara, PA-C ?05/06/2021 2:13 PM ?

## 2021-05-06 ENCOUNTER — Encounter (HOSPITAL_COMMUNITY): Payer: Self-pay | Admitting: Hematology

## 2021-05-06 ENCOUNTER — Other Ambulatory Visit: Payer: Self-pay

## 2021-05-06 ENCOUNTER — Ambulatory Visit (INDEPENDENT_AMBULATORY_CARE_PROVIDER_SITE_OTHER): Payer: Self-pay | Admitting: Nurse Practitioner

## 2021-05-06 ENCOUNTER — Inpatient Hospital Stay (HOSPITAL_BASED_OUTPATIENT_CLINIC_OR_DEPARTMENT_OTHER): Payer: Commercial Managed Care - PPO | Admitting: Physician Assistant

## 2021-05-06 ENCOUNTER — Encounter: Payer: Self-pay | Admitting: Nurse Practitioner

## 2021-05-06 VITALS — Wt 255.0 lb

## 2021-05-06 VITALS — BP 116/78 | HR 84 | Ht 65.0 in | Wt 255.6 lb

## 2021-05-06 DIAGNOSIS — E890 Postprocedural hypothyroidism: Secondary | ICD-10-CM

## 2021-05-06 DIAGNOSIS — D500 Iron deficiency anemia secondary to blood loss (chronic): Secondary | ICD-10-CM | POA: Diagnosis not present

## 2021-05-06 NOTE — Patient Instructions (Signed)

## 2021-05-06 NOTE — Progress Notes (Signed)
05/06/2021     Endocrinology Follow Up Note    Subjective:    Patient ID: Paula Myers, female    DOB: Jan 14, 1986, PCP Noreene Larsson, NP (Inactive).   Past Medical History:  Diagnosis Date   Anemia    prior pregnancies   Anxiety    Depression    Dysrhythmia    "skipping" HR at beginning of pregnancy   H/O cervical polypectomy    Heartburn during pregnancy    History of cesarean section 08/22/2014   Hypertension 2005   while in labor   Iron deficiency anemia due to chronic blood loss 11/21/2020   Kidney stone    Palpitation    SVT (supraventricular tachycardia) (HCC)    SVT (supraventricular tachycardia) ([place])    Tachycardia     Past Surgical History:  Procedure Laterality Date   CESAREAN SECTION     06/11/09; 05/25/2003   CESAREAN SECTION WITH BILATERAL TUBAL LIGATION Bilateral 08/22/2014   Procedure: CESAREAN SECTION WITH BILATERAL TUBAL LIGATION;  Surgeon: Sanjuana Kava, MD;  Location: [facility];  Service: Obstetrics;  Laterality: Bilateral;   DILATION AND CURETTAGE OF UTERUS  01/2008   ELBOW SURGERY     Left    Social History   Socioeconomic History   Marital status: Married    Spouse name: Not on file   Number of children: 3   Years of education: Not on file   Highest education level: Not on file  Occupational History   Occupation: [employer]    Comment: Dispatcher  Tobacco Use   Smoking status: Every Day    Packs/day: 0.25    Years: 20.00    Pack years: 5.00    Types: Cigarettes   Smokeless tobacco: Never  Vaping Use   Vaping Use: Former  Substance and Sexual Activity   Alcohol use: Not Currently    Comment: maybe 1-2 per year   Drug use: No   Sexual activity: Yes    Birth control/protection: Surgical    Comment: tubal  Other Topics Concern   Not on file  Social History Narrative   Not on file   Social Determinants of Health   Financial Resource Strain: Low Risk    Difficulty of Paying Living Expenses: Not very  hard  Food Insecurity: No Food Insecurity   Worried About Charity fundraiser in the Last Year: Never true   Ran Out of Food in the Last Year: Never true  Transportation Needs: No Transportation Needs   Lack of Transportation (Medical): No   Lack of Transportation (Non-Medical): No  Physical Activity: Insufficiently Active   Days of Exercise per Week: 3 days   Minutes of Exercise per Session: 20 min  Stress: No Stress Concern Present   Feeling of Stress : Only a little  Social Connections: Moderately Integrated   Frequency of Communication with Friends and Family: More than three times a week   Frequency of Social Gatherings with Friends and Family: Once a week   Attends Religious Services: 1 to 4 times per year   Active Member of [employer] or Organizations: No   Attends Archivist Meetings: Never   Marital Status: Married    Family History  Problem Relation Age of Onset   Cancer Paternal Grandfather        thyroid   Cancer Paternal Grandmother        ovarian   Diabetes Maternal Grandmother    Hypertension Maternal Grandmother  Heart disease Maternal Grandmother    Hypertension Mother    Diabetes Mother    Diabetes Brother     Outpatient Encounter Medications as of 05/06/2021  Medication Sig   acetaminophen (TYLENOL) 325 MG tablet Take 650 mg by mouth every 6 (six) hours as needed for headache.   ferrous sulfate 325 (65 FE) MG tablet Take 1 tablet (325 mg total) by mouth daily.   hydrochlorothiazide (MICROZIDE) 12.5 MG capsule Take 1 capsule (12.5 mg total) by mouth daily.   ibuprofen (ADVIL) 200 MG tablet Take 400 mg by mouth every 6 (six) hours as needed for headache.   levothyroxine (SYNTHROID) 125 MCG tablet Take 1 tablet (125 mcg total) by mouth daily.   tranexamic acid (LYSTEDA) 650 MG TABS tablet Take 1-2 tablets up to thee times per day during your period as needed.  MAX 5 days   No facility-administered encounter medications on file as of 05/06/2021.     ALLERGIES: Allergies  Allergen Reactions   Levaquin [Levofloxacin In D5w] Anaphylaxis   Levofloxacin Anaphylaxis   Pertussis Vaccines Swelling    T-Dap injection caused localized swelling of arm. Has had Tetanus before without problems.   Megace [Megestrol] Other (See Comments)    Cause Depression   Tetanus-Diphth-Acell Pertussis Swelling   Venofer [Iron Sucrose] Other (See Comments)    Chest Pain    VACCINATION STATUS: Immunization History  Administered Date(s) Administered   DTaP 09/09/1983, 11/18/1983, 02/03/1984, 01/25/1985, 10/29/1988   Hepatitis B 12/09/1994, 01/14/1995, 06/16/1995   IPV 09/09/1983, 11/18/1983, 02/03/1984, 01/25/1985   Influenza,inj,Quad PF,6+ Mos 11/07/2020   Influenza-Unspecified 12/18/2002, 01/23/2014   MMR 10/28/1984, 12/26/2004   Moderna Sars-Covid-2 Vaccination 12/19/2019, 01/16/2020   Tdap 12/26/2004     HPI  Paula Myers is 36 y.o. female who presents today with a medical history as above. she is being seen in follow up after being seen in consultation for hyperthyroidism requested by Noreene Larsson, NP (Inactive).   she denies dysphagia, choking, shortness of breath, no recent voice change.    she does family history of thyroid dysfunction in her grandmother (Graves disease) and mother (MNG requiring thyroidectomy), but denies family hx of thyroid cancer. she denies personal history of goiter. she is not on any anti-thyroid medications nor on any thyroid hormone supplements. She does intermittently use of Biotin containing supplements but has not taken it in a few weeks.   She underwent RAI ablation for Graves disease on 11/15/20 and has since been started on thyroid hormone replacement.   Review of systems  Constitutional: + Minimally fluctuating body weight,  current Body mass index is 42.53 kg/m. , no fatigue, no subjective hyperthermia, no subjective hypothermia Eyes: no blurry vision, no xerophthalmia ENT: no sore throat, no  nodules palpated in throat, no dysphagia/odynophagia, no hoarseness Cardiovascular: no chest pain, no shortness of breath, no palpitations, no leg swelling Respiratory: no cough, no shortness of breath Gastrointestinal: no nausea/vomiting/diarrhea Genitourinary: + irregular menses- having hysterectomy next week Musculoskeletal: no muscle/joint aches Skin: no rashes, no hyperemia Neurological: no tremors, no numbness, no tingling, no dizziness Psychiatric: no depression, no anxiety, + increased stress lately   Objective:    BP 116/78    Pulse 84    Ht _0  (1.651 m)    Wt 255 lb 9.6 oz (115.9 kg)    SpO2 99%    BMI 42.53 kg/m   Wt Readings from Last 3 Encounters:  05/06/21 255 lb 9.6 oz (115.9 kg)  04/21/21 253 lb (114.8  kg)  04/10/21 255 lb (115.7 kg)     BP Readings from Last 3 Encounters:  05/06/21 116/78  04/21/21 115/72  04/10/21 116/81                        Physical Exam- Limited  Constitutional:  Body mass index is 42.53 kg/m. , not in acute distress, normal state of mind Eyes:  EOMI, no exophthalmos Neck: Supple Cardiovascular: RRR, no murmurs, rubs, or gallops, no edema Respiratory: Adequate breathing efforts, no crackles, rales, rhonchi, or wheezing Musculoskeletal: no gross deformities, strength intact in all four extremities, no gross restriction of joint movements Skin:  no rashes, no hyperemia Neurological: no tremor with outstretched hands   CMP     Component Value Date/Time   NA 138 11/05/2020 0907   K 4.2 11/05/2020 0907   CL 103 11/05/2020 0907   CO2 20 11/05/2020 0907   GLUCOSE 81 11/05/2020 0907   GLUCOSE 90 10/27/2020 0736   BUN 7 11/05/2020 0907   CREATININE 0.65 11/05/2020 0907   CALCIUM 9.5 11/05/2020 0907   PROT 7.0 11/05/2020 0907   ALBUMIN 4.3 11/05/2020 0907   AST 17 11/05/2020 0907   ALT 22 11/05/2020 0907   ALKPHOS 101 11/05/2020 0907   BILITOT 0.2 11/05/2020 0907   GFRNONAA >60 10/27/2020 0736   GFRAA >60 09/22/2019 1938      CBC    Component Value Date/Time   WBC 10.6 (H) 05/05/2021 0916   RBC 4.79 05/05/2021 0916   HGB 14.1 05/05/2021 0916   HGB 12.4 11/05/2020 0907   HCT 43.2 05/05/2021 0916   HCT 39.5 11/05/2020 0907   PLT 281 05/05/2021 0916   PLT 355 11/05/2020 0907   MCV 90.2 05/05/2021 0916   MCV 74 (L) 11/05/2020 0907   MCH 29.4 05/05/2021 0916   MCHC 32.6 05/05/2021 0916   RDW 13.8 05/05/2021 0916   RDW 20.3 (H) 11/05/2020 0907   LYMPHSABS 2.7 05/05/2021 0916   LYMPHSABS 3.3 (H) 11/05/2020 0907   MONOABS 0.5 05/05/2021 0916   EOSABS 0.2 05/05/2021 0916   EOSABS 0.1 11/05/2020 0907   BASOSABS 0.1 05/05/2021 0916   BASOSABS 0.1 11/05/2020 0907     Diabetic Labs (most recent): No results found for: HGBA1C  Lipid Panel     Component Value Date/Time   CHOL 178 11/05/2020 0907   TRIG 101 11/05/2020 0907   HDL 46 11/05/2020 0907   LDLCALC 114 (H) 11/05/2020 0907   LABVLDL 18 11/05/2020 0907     Lab Results  Component Value Date   TSH 19.900 (H) 03/31/2021   TSH 47.300 (H) 02/04/2021   TSH <0.005 (L) 12/31/2020   TSH <0.005 (L) 10/07/2020   FREET4 0.95 03/31/2021   FREET4 0.13 (L) 02/04/2021   FREET4 1.28 12/31/2020   FREET4 1.84 (H) 10/07/2020     Uptake and Scan from 10/17/20 CLINICAL DATA:  Hyperthyroidism, TSH 0.450   EXAM: THYROID SCAN AND UPTAKE - 4 AND 24 HOURS   TECHNIQUE: Following oral administration of I-123 capsule, anterior planar imaging was acquired at 24 hours. Thyroid uptake was calculated with a thyroid probe at 4-6 hours and 24 hours.   RADIOPHARMACEUTICALS:  436 uCi I-123 sodium iodide p.o.   COMPARISON:  None   FINDINGS: Homogeneous tracer distribution in both thyroid lobes.   No focal areas of increased or decreased tracer localization.   4 hour I-123 uptake = 14.1% (normal 5-20%)   24 hour I-123 uptake = 31.1% (  normal 10-30%)   IMPRESSION: Normal thyroid scan.   Minimally elevated 24 hour radio iodine uptake.   In the setting  of hyperthyroidism, findings suggest Graves disease     Electronically Signed   By: Lavonia Dana M.D.   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  -She underwent RAI ablation on 11/15/20.    Between visits, she called to inform me of symptoms of under-replacement including fatigue, weight gain, tearful, etc.  She was subsequently rechecked and started on thyroid hormone replacement currently at 125 mcg po daily before breakfast.  Will repeat TFTs this week to see if labs have improved.  Will repeat again in 3 months for continued surveillance so dosage adjustments can be made if necessary.       -Patient is advised to maintain close follow up with Noreene Larsson, NP (Inactive) for primary care needs.    I spent 20 minutes in the care of the patient today including review of labs from Thyroid Function, CMP, and other relevant labs ; imaging/biopsy records (current and previous including abstractions from other facilities); face-to-face time discussing  her lab results and symptoms, medications doses, her options of short and long term treatment based on the latest standards of care / guidelines;   and documenting the encounter.  Dorian Heckle Coale  participated in the discussions, expressed understanding, and voiced agreement with the above plans.  All questions were answered to her satisfaction. she is encouraged to contact clinic should she have any questions or concerns prior to her return  visit.    Follow up plan: Return in about 3 months (around 08/06/2021) for Thyroid follow up, Previsit labs.   Thank you for involving me in the care of this pleasant patient, and I will continue to update you with her progress.    Rayetta Pigg, [facility] [address] [place], [place] [address] Phone: [phone] Fax: [phone]  05/06/2021, 10:38 AM

## 2021-05-07 ENCOUNTER — Ambulatory Visit: Payer: Commercial Managed Care - PPO | Admitting: Nurse Practitioner

## 2021-05-09 ENCOUNTER — Encounter (HOSPITAL_COMMUNITY)
Admission: RE | Admit: 2021-05-09 | Discharge: 2021-05-09 | Disposition: A | Discharge status: Home / Self Care | Payer: Commercial Managed Care - PPO | Source: Ambulatory Visit | Attending: Obstetrics & Gynecology | Admitting: Obstetrics & Gynecology

## 2021-05-09 ENCOUNTER — Encounter (HOSPITAL_COMMUNITY): Payer: Self-pay

## 2021-05-09 ENCOUNTER — Other Ambulatory Visit (HOSPITAL_COMMUNITY)
Admission: RE | Admit: 2021-05-09 | Discharge: 2021-05-09 | Disposition: A | Discharge status: Home / Self Care | Payer: Commercial Managed Care - PPO | Source: Ambulatory Visit | Attending: Obstetrics & Gynecology | Admitting: Obstetrics & Gynecology

## 2021-05-09 DIAGNOSIS — Z01818 Encounter for other preprocedural examination: Secondary | ICD-10-CM

## 2021-05-09 DIAGNOSIS — Z20822 Contact with and (suspected) exposure to covid-19: Secondary | ICD-10-CM | POA: Diagnosis not present

## 2021-05-09 DIAGNOSIS — N920 Excessive and frequent menstruation with regular cycle: Secondary | ICD-10-CM | POA: Diagnosis not present

## 2021-05-09 HISTORY — DX: Thyrotoxicosis, unspecified without thyrotoxic crisis or storm: E05.90

## 2021-05-09 LAB — PREGNANCY, URINE: Preg Test, Ur: NEGATIVE

## 2021-05-09 LAB — CBC
HCT: 45.7 % (ref 36.0–46.0)
Hemoglobin: 15.1 g/dL — ABNORMAL HIGH (ref 12.0–15.0)
MCH: 30 pg (ref 26.0–34.0)
MCHC: 33 g/dL (ref 30.0–36.0)
MCV: 90.7 fL (ref 80.0–100.0)
Platelets: 282 10*3/uL (ref 150–400)
RBC: 5.04 MIL/uL (ref 3.87–5.11)
RDW: 14.3 % (ref 11.5–15.5)
WBC: 8.9 10*3/uL (ref 4.0–10.5)
nRBC: 0 % (ref 0.0–0.2)

## 2021-05-09 LAB — BASIC METABOLIC PANEL
Anion gap: 9 (ref 5–15)
BUN: 9 mg/dL (ref 6–20)
CO2: 25 mmol/L (ref 22–32)
Calcium: 8.9 mg/dL (ref 8.9–10.3)
Chloride: 105 mmol/L (ref 98–111)
Creatinine, Ser: 0.84 mg/dL (ref 0.44–1.00)
GFR, Estimated: >60 mL/min (ref >60)
Glucose, Bld: 97 mg/dL (ref 70–99)
Potassium: 4.3 mmol/L (ref 3.5–5.1)
Sodium: 139 mmol/L (ref 135–145)

## 2021-05-09 LAB — TYPE AND SCREEN
ABO/RH(D): A POS
Antibody Screen: NEGATIVE

## 2021-05-09 NOTE — Patient Instructions (Signed)
Paula Myers  05/09/2021     @PREFPERIOPPHARMACY @   Your procedure is scheduled on 05/13/2021.  Report to [facility] at 6:00 A.M.  Call this number if you have problems the morning of surgery:  [phone]   Remember:  Do not eat or drink after midnight.      Take these medicines the morning of surgery with A SIP OF WATER : Levothyroxine    Do not wear jewelry, make-up or nail polish.  Do not wear lotions, powders, or perfumes, or deodorant.  Do not shave 48 hours prior to surgery.  Men may shave face and neck.  Do not bring valuables to the hospital.  [facility] is not responsible for any belongings or valuables.  Contacts, dentures or bridgework may not be worn into surgery.  Leave your suitcase in the car.  After surgery it may be brought to your room.  For patients admitted to the hospital, discharge time will be determined by your treatment team.  Patients discharged the day of surgery will not be allowed to drive home.   Name and phone number of your driver:   family Special instructions:  N/A  Please read over the following fact sheets that you were given. Care and Recovery After Surgery  Abdominal Hysterectomy Abdominal hysterectomy is a surgery to remove the womb. The womb is also called the uterus. The womb is the part of the body that holds a growing baby. This surgery may be done if you have: Cancer of the womb. Growths in your womb. Infection. Pain. Very bad bleeding. Problems with your menstrual period. Other health problems that affect the organs that help you get pregnant. Other parts that help in pregnancy may also be removed. These include: The lowest part of the womb (cervix). The organs that make eggs (ovaries). The tubes that move the egg to the womb (fallopian tubes). Tell your doctor about: Any allergies you have. All medicines you are taking. This includes vitamins, herbs, eye drops, creams, and over-the-counter medicines. Any  problems you or family members have had with medicines that make you fall asleep (anesthetic medicines). Any blood disorders you have. Any surgeries you have had. Any medical conditions you have. Whether you are pregnant or may be pregnant. What are the risks? Generally, this is a safe surgery. But problems may occur, including: Bleeding. Infection. Allergies. Damage to nearby parts. Nerve injury. Less interest in sex. Pain during sex. Blood clots. What happens before the procedure? Staying hydrated Follow instructions from your doctor about hydration. These may include: Up to 2 hours before the procedure - you may continue to drink clear liquids. These include water, clear fruit juice, black coffee, and plain tea. Eating and drinking restrictions Follow instructions from your doctor about eating and drinking. These may include: 8 hours before the procedure - stop eating heavy meals or foods. These include meat, fried foods, or fatty foods. 6 hours before the procedure - stop eating light meals or foods. These include toast or cereal. 6 hours before the procedure - stop drinking milk or drinks that contain milk. 2 hours before the procedure - stop drinking clear liquids. Medicines Ask your doctor about changing or stopping: Your normal medicines. Vitamins, herbs, and supplements. Over-the-counter medicines. Do not take aspirin or ibuprofen unless you are told to. You may be asked to take a medicine that helps you poop (laxative). Surgery safety For your safety, your doctor may: Loraine LericheMark the area of surgery. Remove hair at the  surgery site. Ask you to wash with a soap that kills germs. Give you antibiotic medicine. General instructions This surgery can affect the way you feel about yourself. Ask your doctor about the changes caused by this surgery. You may have blood and urine tests. Do not smoke or use any products that contain nicotine or tobacco for 4 weeks before the  procedure. If you need help quitting, ask your doctor. You may need to have an enema to clean out your butt and lower colon. Plan to have a responsible adult take you home from the hospital or clinic. What happens during the procedure? An IV tube will be put into one of your veins. You may be given: A sedative. This medicine helps you relax. Anesthetics. These medicines: Numb certain areas of your body. Make you fall asleep for surgery. Tight (compression) stockings will be placed on your legs. This will help with blood flow. A thin tube will be placed to help drain your pee (urine). A cut (incision) will be made through the skin in your lower belly. The body tissue that covers your womb will be moved aside. Your doctor will take out the womb and other parts if needed. Bleeding will be stopped with clamps or stitches (sutures). Your cut will be closed with sutures, skin glue, or skin tape. A bandage (dressing) will be placed over the cut. The procedure may vary among doctors and hospitals. What happens after the procedure? You will be monitored until you leave the hospital or clinic. This includes checking your blood pressure, heart rate, breathing rate, and blood oxygen level. You will be given medicine for pain. You will need to stay in the hospital for 1-2 days. You will eat a liquid diet at first. You will still have the tube in place to drain your pee. You may have to wear tight stockings. These stockings help to prevent blood clots and reduce swelling in your legs. You will be asked to walk as soon as possible. You will do breathing exercises or use a machine to help keep your lungs clear. You may need to use a pad for fluids that come from your vagina. Summary Abdominal hysterectomy is a surgery to remove your womb. The womb is the part of the body that holds a growing baby. Talk with your doctor about the changes this surgery may cause. The changes can affect how you feel about  yourself. You will be given medicine for pain. You will need to stay in the hospital for 1-2 days. This information is not intended to replace advice given to you by your health care provider. Make sure you discuss any questions you have with your health care provider. Document Revised: 10/19/2019 Document Reviewed: 10/19/2019 Elsevier Patient Education  2022 Elsevier Inc.  Chlorhexidine Topical Solution What is this medication? CHLORHEXIDINE (klor HEX i deen) prevents skin infection. It is often used to clean and disinfect the skin after an injury or before a procedure. It works by killing or preventing the growth of bacteria on the skin. This medicine may be used for other purposes; ask your health care provider or pharmacist if you have questions. COMMON BRAND NAME(S): Betasept, Chlorostat, Hibiclens What should I tell my care team before I take this medication? They need to know if you have any of the following conditions: Any skin rashes or problems An unusual or allergic reaction to chlorhexidine, other medications, foods, dyes, or preservatives Pregnant or trying to get pregnant Breast-feeding How should I use this  medication? This medication is for external use only. Do not take by mouth. Follow the directions on the label or those given to you by your care team. Keep out of eyes, ears and mouth. This medication should not be used as a preoperative skin preparation of the face or head. Talk to your care team about the use of this medication in children. While this medication may be used for children for selected conditions, precautions do apply. Overdosage: If you think you have taken too much of this medicine contact a poison control center or emergency room at once. NOTE: This medicine is only for you. Do not share this medicine with others. What if I miss a dose? This does not apply; this medication is not for regular use. What may interact with this medication? Interactions are  not expected. This list may not describe all possible interactions. Give your health care provider a list of all the medicines, herbs, non-prescription drugs, or dietary supplements you use. Also tell them if you smoke, drink alcohol, or use illegal drugs. Some items may interact with your medicine. What should I watch for while using this medication? This medication may cause severe allergic reactions. Notify your care team right away if you think you are having an allergic reaction. Do not take this medication by mouth. Avoid contact with your ears and eyes. If contact with the eyes occur, rinse the eyes well with plenty of cool tap water. What side effects may I notice from receiving this medication? Side effects that you should report to your care team as soon as possible: Allergic reactions--skin rash, itching, hives, swelling of the face, lips, tongue, or throat Side effects that usually do not require medical attention (report to your care team if they continue or are bothersome): Mild skin irritation, redness, or dryness This list may not describe all possible side effects. Call your doctor for medical advice about side effects. You may report side effects to FDA at 1-800-FDA-1088. Where should I keep my medication? Keep out of the reach of children. Store at room temperature between 15 and 30 degrees C (59 and 86 degrees F). Store away from direct light and heat. Do not freeze. Throw away any unused medication after the expiration date. NOTE: This sheet is a summary. It may not cover all possible information. If you have questions about this medicine, talk to your doctor, pharmacist, or health care provider.  2022 Elsevier/Gold Standard (2020-04-26 00:00:00)

## 2021-05-11 LAB — SARS CORONAVIRUS 2 (TAT 6-24 HRS): SARS Coronavirus 2: NEGATIVE

## 2021-05-11 NOTE — H&P (Incomplete)
[facility] Attending History and Physical  Paula Myers is a 36 y.o. [phone] who presents for scheduled abdominal hysterectomy and bilateral salpingectomy  In review, she has struggled with heavy menses since her last pregnancy.  She will wear both a pad and tampon that need to be changed every 1/2 hour.  Menses will last 8 days with 4 super heavy days. Denies intermenstrual bleeding.  Failed conservative management with OCPs, Nexplanon and ring.  Bleeding has led to iron def. Anemia s/p IV iron transfusion.  Prior tubal ligation.  Denies any abnormal vaginal discharge, fevers, chills, sweats, dysuria, nausea, vomiting, other GI or GU symptoms or other general symptoms.   PMHX: -hypothyroidism -SVT -Depression/Anxiety -Anemia   Past Surgical History:  Procedure Laterality Date   CESAREAN SECTION     06/11/09; 05/25/2003   CESAREAN SECTION WITH BILATERAL TUBAL LIGATION Bilateral 08/22/2014   Procedure: CESAREAN SECTION WITH BILATERAL TUBAL LIGATION;  Surgeon: Essie HartWalda Pinn, MD;  Location: [facility];  Service: Obstetrics;  Laterality: Bilateral;   DILATION AND CURETTAGE OF UTERUS  01/2008   ELBOW SURGERY     Left   OB History  Gravida Para Term Preterm AB Living  4 3 3   1 3   SAB IAB Ectopic Multiple Live Births  1     0 3    # Outcome Date GA Lbr Len/2nd Weight Sex Delivery Anes PTL Lv  4 Term 08/22/14 [id number]  3500 g M CS-LTranv Spinal  LIV  3 Term 06/11/09    F CS-LTranv   LIV  2 SAB 2009          1 Term 05/25/03    Judie PetitM CS-LTranv   LIV  Patient denies any other pertinent gynecologic issues.  No current facility-administered medications on file prior to encounter.   Current Outpatient Medications on File Prior to Encounter  Medication Sig Dispense Refill   acetaminophen (TYLENOL) 325 MG tablet Take 650 mg by mouth every 6 (six) hours as needed for headache.     ferrous sulfate 325 (65 FE) MG tablet Take 1 tablet (325 mg total) by mouth daily.  90 tablet 1   hydrochlorothiazide (MICROZIDE) 12.5 MG capsule Take 1 capsule (12.5 mg total) by mouth daily. 30 capsule 2   ibuprofen (ADVIL) 200 MG tablet Take 400 mg by mouth every 6 (six) hours as needed for headache.     levothyroxine (SYNTHROID) 125 MCG tablet Take 1 tablet (125 mcg total) by mouth daily. 90 tablet 1   tranexamic acid (LYSTEDA) 650 MG TABS tablet Take 1-2 tablets up to thee times per day during your period as needed.  MAX 5 days 30 tablet 1   Allergies  Allergen Reactions   Levaquin [Levofloxacin In D5w] Anaphylaxis   Levofloxacin Anaphylaxis   Pertussis Vaccines Swelling    T-Dap injection caused localized swelling of arm. Has had Tetanus before without problems.   Megace [Megestrol] Other (See Comments)    Cause Depression   Tetanus-Diphth-Acell Pertussis Swelling   Venofer [Iron Sucrose] Other (See Comments)    Chest Pain    Social History:   reports that she has been smoking cigarettes. She has a 5.00 pack-year smoking history. She has never used smokeless tobacco. She reports that she does not currently use alcohol. She reports that she does not use drugs. Family History  Problem Relation Age of Onset   Cancer Paternal Grandfather        thyroid   Cancer Paternal Grandmother  ovarian   Diabetes Maternal Grandmother    Hypertension Maternal Grandmother    Heart disease Maternal Grandmother    Hypertension Mother    Diabetes Mother    Diabetes Brother     Review of Systems: Pertinent items noted in HPI and remainder of comprehensive ROS otherwise negative.  PHYSICAL EXAM: Last menstrual period 04/30/2021. ***  CONSTITUTIONAL: Well-developed, well-nourished female in no acute distress.  SKIN: Skin is warm and dry. No rash noted. Not diaphoretic. No erythema. No pallor. NEUROLOGIC: Alert and oriented to person, place, and time. Normal reflexes, muscle tone coordination. No cranial nerve deficit noted. PSYCHIATRIC: Normal mood and affect. Normal  behavior. Normal judgment and thought content. CARDIOVASCULAR: Normal heart rate noted, regular rhythm RESPIRATORY: Effort and breath sounds normal, no problems with respiration noted ABDOMEN: Soft, nontender, nondistended. PELVIC: deferred MUSCULOSKELETAL: no calf tenderness bilaterally EXT: no edema bilaterally, normal pulses  Labs: Results for orders placed or performed during the hospital encounter of 05/09/21 (from the past 336 hour(s))  SARS CORONAVIRUS 2 (TAT 6-24 HRS) Nasopharyngeal Nasopharyngeal Swab   Collection Time: 05/09/21  3:02 PM   Specimen: Nasopharyngeal Swab  Result Value Ref Range   SARS Coronavirus 2 NEGATIVE NEGATIVE  Pregnancy, urine   Collection Time: 05/09/21  3:40 PM  Result Value Ref Range   Preg Test, Ur NEGATIVE NEGATIVE  CBC   Collection Time: 05/09/21  4:04 PM  Result Value Ref Range   WBC 8.9 4.0 - 10.5 K/uL   RBC 5.04 3.87 - 5.11 MIL/uL   Hemoglobin 15.1 (H) 12.0 - 15.0 g/dL   HCT 95.1 88.4 - 16.6 %   MCV 90.7 80.0 - 100.0 fL   MCH 30.0 26.0 - 34.0 pg   MCHC 33.0 30.0 - 36.0 g/dL   RDW 06.3 01.6 - 01.0 %   Platelets 282 150 - 400 K/uL   nRBC 0.0 0.0 - 0.2 %  Basic metabolic panel   Collection Time: 05/09/21  4:04 PM  Result Value Ref Range   Sodium 139 135 - 145 mmol/L   Potassium 4.3 3.5 - 5.1 mmol/L   Chloride 105 98 - 111 mmol/L   CO2 25 22 - 32 mmol/L   Glucose, Bld 97 70 - 99 mg/dL   BUN 9 6 - 20 mg/dL   Creatinine, Ser 9.32 0.44 - 1.00 mg/dL   Calcium 8.9 8.9 - 35.5 mg/dL   GFR, Estimated >73 >22 mL/min   Anion gap 9 5 - 15  Type and screen [facility]   Collection Time: 05/09/21  4:04 PM  Result Value Ref Range   ABO/RH(D) A POS    Antibody Screen NEG    Sample Expiration      05/23/2021,2359 Performed at [facility], [address] [place], [place] [address]   Results for orders placed or performed in visit on 05/05/21 (from the past 336 hour(s))  Ferritin   Collection Time: 05/05/21  9:16 AM  Result Value Ref  Range   Ferritin 66 11 - 307 ng/mL  Iron and TIBC   Collection Time: 05/05/21  9:16 AM  Result Value Ref Range   Iron 77 28 - 170 ug/dL   TIBC 706 237 - 628 ug/dL   Saturation Ratios 24 10.4 - 31.8 %   UIBC 243 ug/dL  CBC with Differential/Platelet   Collection Time: 05/05/21  9:16 AM  Result Value Ref Range   WBC 10.6 (H) 4.0 - 10.5 K/uL   RBC 4.79 3.87 - 5.11 MIL/uL  Hemoglobin 14.1 12.0 - 15.0 g/dL   HCT 70.2 63.7 - 85.8 %   MCV 90.2 80.0 - 100.0 fL   MCH 29.4 26.0 - 34.0 pg   MCHC 32.6 30.0 - 36.0 g/dL   RDW 85.0 27.7 - 41.2 %   Platelets 281 150 - 400 K/uL   nRBC 0.0 0.0 - 0.2 %   Neutrophils Relative % 68 %   Neutro Abs 7.1 1.7 - 7.7 K/uL   Lymphocytes Relative 25 %   Lymphs Abs 2.7 0.7 - 4.0 K/uL   Monocytes Relative 5 %   Monocytes Absolute 0.5 0.1 - 1.0 K/uL   Eosinophils Relative 1 %   Eosinophils Absolute 0.2 0.0 - 0.5 K/uL   Basophils Relative 1 %   Basophils Absolute 0.1 0.0 - 0.1 K/uL   Immature Granulocytes 0 %   Abs Immature Granulocytes 0.03 0.00 - 0.07 K/uL    Imaging Studies: Pelvic US (04/02/2021): 6.7cm anteverted uterus- no abnormalities.  Normal ovaries bilaterally  Assessment: Heavy menstrual bleeding  Plan: Abdominal hysterectomy and bilateral salpingectomy -NPO -LR @ 125cc/hr -SCDs to OR -Ancef 2g IV to OR -preop pain management with Toradol -Risk/benefits and alternatives reviewed with the patient including but not limited to risk of bleeding, infection and injury to surrounding organs.  Questions and concerns were addressed and pt desires to proceed  Myna Hidalgo, DO Attending Obstetrician & Gynecologist, [facility] for [facility], [facility]

## 2021-05-13 ENCOUNTER — Ambulatory Visit (HOSPITAL_COMMUNITY)
Admission: RE | Admit: 2021-05-13 | Discharge: 2021-05-13 | Disposition: A | Discharge status: Home / Self Care | Payer: Commercial Managed Care - PPO | Attending: Obstetrics & Gynecology | Admitting: Obstetrics & Gynecology

## 2021-05-13 ENCOUNTER — Ambulatory Visit (HOSPITAL_COMMUNITY): Payer: Commercial Managed Care - PPO | Admitting: Certified Registered Nurse Anesthetist

## 2021-05-13 ENCOUNTER — Telehealth: Payer: Self-pay | Admitting: *Deleted

## 2021-05-13 ENCOUNTER — Ambulatory Visit (HOSPITAL_BASED_OUTPATIENT_CLINIC_OR_DEPARTMENT_OTHER): Payer: Commercial Managed Care - PPO | Admitting: Certified Registered Nurse Anesthetist

## 2021-05-13 ENCOUNTER — Encounter (HOSPITAL_COMMUNITY): Payer: Self-pay | Admitting: Obstetrics & Gynecology

## 2021-05-13 ENCOUNTER — Encounter: Payer: Self-pay | Admitting: Obstetrics & Gynecology

## 2021-05-13 ENCOUNTER — Other Ambulatory Visit: Payer: Self-pay

## 2021-05-13 ENCOUNTER — Encounter (HOSPITAL_COMMUNITY)
Admission: RE | Disposition: A | Discharge status: Home / Self Care | Payer: Self-pay | Source: Home / Self Care | Attending: Obstetrics & Gynecology

## 2021-05-13 DIAGNOSIS — N939 Abnormal uterine and vaginal bleeding, unspecified: Secondary | ICD-10-CM

## 2021-05-13 DIAGNOSIS — Z01818 Encounter for other preprocedural examination: Secondary | ICD-10-CM

## 2021-05-13 DIAGNOSIS — I10 Essential (primary) hypertension: Secondary | ICD-10-CM

## 2021-05-13 DIAGNOSIS — D759 Disease of blood and blood-forming organs, unspecified: Secondary | ICD-10-CM | POA: Diagnosis not present

## 2021-05-13 DIAGNOSIS — N938 Other specified abnormal uterine and vaginal bleeding: Secondary | ICD-10-CM | POA: Diagnosis not present

## 2021-05-13 DIAGNOSIS — N840 Polyp of corpus uteri: Secondary | ICD-10-CM | POA: Insufficient documentation

## 2021-05-13 DIAGNOSIS — N736 Female pelvic peritoneal adhesions (postinfective): Secondary | ICD-10-CM | POA: Diagnosis not present

## 2021-05-13 DIAGNOSIS — F418 Other specified anxiety disorders: Secondary | ICD-10-CM | POA: Diagnosis not present

## 2021-05-13 DIAGNOSIS — N888 Other specified noninflammatory disorders of cervix uteri: Secondary | ICD-10-CM | POA: Insufficient documentation

## 2021-05-13 DIAGNOSIS — F17210 Nicotine dependence, cigarettes, uncomplicated: Secondary | ICD-10-CM | POA: Diagnosis not present

## 2021-05-13 DIAGNOSIS — D500 Iron deficiency anemia secondary to blood loss (chronic): Secondary | ICD-10-CM | POA: Insufficient documentation

## 2021-05-13 DIAGNOSIS — N920 Excessive and frequent menstruation with regular cycle: Secondary | ICD-10-CM

## 2021-05-13 HISTORY — PX: ABDOMINAL HYSTERECTOMY: SHX81

## 2021-05-13 SURGERY — HYSTERECTOMY, ABDOMINAL
Anesthesia: General | Site: Abdomen

## 2021-05-13 MED ORDER — PROPOFOL 10 MG/ML IV BOLUS
INTRAVENOUS | Status: DC | PRN
  Administered 2021-05-13: 150 mg via INTRAVENOUS

## 2021-05-13 MED ORDER — SUGAMMADEX SODIUM 200 MG/2ML IV SOLN
INTRAVENOUS | Status: DC | PRN
  Administered 2021-05-13: 231.4 mg via INTRAVENOUS

## 2021-05-13 MED ORDER — ORAL CARE MOUTH RINSE: 15.0000 mL | Freq: Once | OROMUCOSAL | Status: AC

## 2021-05-13 MED ORDER — ONDANSETRON 4 MG PO TBDP
4.0000 mg | ORAL_TABLET | Freq: Three times a day (TID) | ORAL | 0 refills | Status: DC | PRN
Start: 2021-05-13 — End: 2021-05-21

## 2021-05-13 MED ORDER — LACTATED RINGERS IV SOLN: INTRAVENOUS | Status: DC

## 2021-05-13 MED ORDER — ACETAMINOPHEN 10 MG/ML IV SOLN
INTRAVENOUS | Status: DC | PRN
  Administered 2021-05-13: 1000 mg via INTRAVENOUS

## 2021-05-13 MED ORDER — ONDANSETRON HCL 4 MG/2ML IJ SOLN
INTRAMUSCULAR | Status: AC
  Filled 2021-05-13: qty 2

## 2021-05-13 MED ORDER — OXYCODONE HCL 5 MG PO CAPS: 5.0000 mg | ORAL_CAPSULE | Freq: Four times a day (QID) | ORAL | 0 refills | Status: DC | PRN

## 2021-05-13 MED ORDER — PROPOFOL 10 MG/ML IV BOLUS
INTRAVENOUS | Status: AC
  Filled 2021-05-13: qty 20

## 2021-05-13 MED ORDER — POVIDONE-IODINE 10 % EX SWAB: 2.0000 "application " | Freq: Once | CUTANEOUS | Status: DC

## 2021-05-13 MED ORDER — HYDROMORPHONE HCL 1 MG/ML IJ SOLN
INTRAMUSCULAR | Status: AC
  Filled 2021-05-13: qty 0.5

## 2021-05-13 MED ORDER — FENTANYL CITRATE PF 50 MCG/ML IJ SOSY
25.0000 ug | PREFILLED_SYRINGE | INTRAMUSCULAR | Status: DC | PRN
  Administered 2021-05-13 (×3): 50 ug via INTRAVENOUS
  Filled 2021-05-13 (×3): qty 1

## 2021-05-13 MED ORDER — LIDOCAINE 2% (20 MG/ML) 5 ML SYRINGE
INTRAMUSCULAR | Status: DC | PRN
  Administered 2021-05-13: 80 mg via INTRAVENOUS

## 2021-05-13 MED ORDER — OXYCODONE-ACETAMINOPHEN 5-325 MG PO TABS: 1.0000 | ORAL_TABLET | ORAL | 0 refills | Status: DC | PRN

## 2021-05-13 MED ORDER — ROCURONIUM BROMIDE 10 MG/ML (PF) SYRINGE
PREFILLED_SYRINGE | INTRAVENOUS | Status: DC | PRN
  Administered 2021-05-13: 60 mg via INTRAVENOUS
  Administered 2021-05-13: 40 mg via INTRAVENOUS

## 2021-05-13 MED ORDER — MIDAZOLAM HCL 2 MG/2ML IJ SOLN
INTRAMUSCULAR | Status: AC
  Filled 2021-05-13: qty 2

## 2021-05-13 MED ORDER — BUPIVACAINE-MELOXICAM ER 200-6 MG/7ML IJ SOLN
INTRAMUSCULAR | Status: AC
  Filled 2021-05-13: qty 1

## 2021-05-13 MED ORDER — CEFAZOLIN SODIUM-DEXTROSE 2-4 GM/100ML-% IV SOLN
2.0000 g | INTRAVENOUS | Status: AC
  Administered 2021-05-13: 2 g via INTRAVENOUS
  Filled 2021-05-13: qty 100

## 2021-05-13 MED ORDER — ACETAMINOPHEN 10 MG/ML IV SOLN
INTRAVENOUS | Status: AC
  Filled 2021-05-13: qty 100

## 2021-05-13 MED ORDER — HYDROMORPHONE HCL 1 MG/ML IJ SOLN
0.5000 mg | INTRAMUSCULAR | Status: DC | PRN
  Administered 2021-05-13: 0.5 mg via INTRAVENOUS

## 2021-05-13 MED ORDER — HYDROMORPHONE HCL 1 MG/ML IJ SOLN
INTRAMUSCULAR | Status: DC | PRN
Start: 2021-05-13 — End: 2021-05-13
  Administered 2021-05-13: 1 mg via INTRAVENOUS

## 2021-05-13 MED ORDER — ONDANSETRON HCL 4 MG/2ML IJ SOLN
INTRAMUSCULAR | Status: DC | PRN
  Administered 2021-05-13: 4 mg via INTRAVENOUS

## 2021-05-13 MED ORDER — HYDROMORPHONE HCL 1 MG/ML IJ SOLN
INTRAMUSCULAR | Status: AC
  Filled 2021-05-13: qty 1

## 2021-05-13 MED ORDER — ONDANSETRON HCL 4 MG/2ML IJ SOLN
4.0000 mg | Freq: Once | INTRAMUSCULAR | Status: AC | PRN
  Administered 2021-05-13: 4 mg via INTRAVENOUS
  Filled 2021-05-13: qty 2

## 2021-05-13 MED ORDER — ROCURONIUM BROMIDE 10 MG/ML (PF) SYRINGE
PREFILLED_SYRINGE | INTRAVENOUS | Status: AC
  Filled 2021-05-13: qty 10

## 2021-05-13 MED ORDER — DEXAMETHASONE SODIUM PHOSPHATE 10 MG/ML IJ SOLN
INTRAMUSCULAR | Status: DC | PRN
  Administered 2021-05-13: 10 mg via INTRAVENOUS

## 2021-05-13 MED ORDER — ACETAMINOPHEN 325 MG PO TABS: 650.0000 mg | ORAL_TABLET | Freq: Four times a day (QID) | ORAL | Status: AC | PRN

## 2021-05-13 MED ORDER — FENTANYL CITRATE (PF) 250 MCG/5ML IJ SOLN
INTRAMUSCULAR | Status: DC | PRN
  Administered 2021-05-13: 100 ug via INTRAVENOUS

## 2021-05-13 MED ORDER — 0.9 % SODIUM CHLORIDE (POUR BTL) OPTIME
TOPICAL | Status: DC | PRN
  Administered 2021-05-13 (×2): 1000 mL

## 2021-05-13 MED ORDER — MIDAZOLAM HCL 5 MG/5ML IJ SOLN
INTRAMUSCULAR | Status: DC | PRN
  Administered 2021-05-13: 2 mg via INTRAVENOUS

## 2021-05-13 MED ORDER — IBUPROFEN 600 MG PO TABS
600.0000 mg | ORAL_TABLET | Freq: Four times a day (QID) | ORAL | 0 refills | Status: DC | PRN
Start: 2021-05-13 — End: 2022-12-30

## 2021-05-13 MED ORDER — BUPIVACAINE-MELOXICAM ER 400-12 MG/14ML IJ SOLN
INTRAMUSCULAR | Status: DC | PRN
Start: 2021-05-13 — End: 2021-05-13
  Administered 2021-05-13: 7 mL

## 2021-05-13 MED ORDER — GABAPENTIN 300 MG PO CAPS: 300.0000 mg | ORAL_CAPSULE | Freq: Three times a day (TID) | ORAL | 0 refills | Status: DC

## 2021-05-13 MED ORDER — SUGAMMADEX SODIUM 500 MG/5ML IV SOLN
INTRAVENOUS | Status: AC
  Filled 2021-05-13: qty 5

## 2021-05-13 MED ORDER — FENTANYL CITRATE (PF) 100 MCG/2ML IJ SOLN
INTRAMUSCULAR | Status: AC
  Filled 2021-05-13: qty 2

## 2021-05-13 MED ORDER — DEXAMETHASONE SODIUM PHOSPHATE 10 MG/ML IJ SOLN
INTRAMUSCULAR | Status: AC
  Filled 2021-05-13: qty 1

## 2021-05-13 MED ORDER — KETOROLAC TROMETHAMINE 10 MG PO TABS
10.0000 mg | ORAL_TABLET | Freq: Four times a day (QID) | ORAL | 0 refills | Status: AC | PRN
Start: 2021-05-13 — End: 2021-05-16

## 2021-05-13 MED ORDER — KETOROLAC TROMETHAMINE 15 MG/ML IJ SOLN
30.0000 mg | INTRAMUSCULAR | Status: AC
  Administered 2021-05-13: 30 mg via INTRAVENOUS
  Filled 2021-05-13: qty 2

## 2021-05-13 MED ORDER — CHLORHEXIDINE GLUCONATE 0.12 % MT SOLN
15.0000 mL | Freq: Once | OROMUCOSAL | Status: AC
  Administered 2021-05-13: 15 mL via OROMUCOSAL

## 2021-05-13 MED ORDER — LIDOCAINE HCL (PF) 2 % IJ SOLN
INTRAMUSCULAR | Status: AC
  Filled 2021-05-13: qty 5

## 2021-05-13 MED ORDER — HEMOSTATIC AGENTS (NO CHARGE) OPTIME
TOPICAL | Status: DC | PRN
  Administered 2021-05-13: 1 via TOPICAL

## 2021-05-13 SURGICAL SUPPLY — 54 items
ADH SKN CLS APL DERMABOND .7 (GAUZE/BANDAGES/DRESSINGS) ×2
APL SWBSTK 6 STRL LF DISP (MISCELLANEOUS) ×2
APPLICATOR COTTON TIP 6 STRL (MISCELLANEOUS) ×2 IMPLANT
APPLICATOR COTTON TIP 6IN STRL (MISCELLANEOUS) ×3 IMPLANT
APPLIER CLIP 13 LRG OPEN (CLIP)
APR CLP LRG 13 20 CLIP (CLIP)
CLIP APPLIE 13 LRG OPEN (CLIP) IMPLANT
CLOTH BEACON ORANGE TIMEOUT ST (SAFETY) ×6 IMPLANT
COVER LIGHT HANDLE STERIS (MISCELLANEOUS) ×6 IMPLANT
DERMABOND ADVANCED (GAUZE/BANDAGES/DRESSINGS) ×1
DERMABOND ADVANCED .7 DNX12 (GAUZE/BANDAGES/DRESSINGS) ×2 IMPLANT
DRAPE WARM FLUID 44X44 (DRAPES) ×3 IMPLANT
DRSG OPSITE POSTOP 4X10 (GAUZE/BANDAGES/DRESSINGS) ×2 IMPLANT
DURAPREP 26ML APPLICATOR (WOUND CARE) ×3 IMPLANT
ELECT REM PT RETURN 9FT ADLT (ELECTROSURGICAL) ×3
ELECTRODE REM PT RTRN 9FT ADLT (ELECTROSURGICAL) ×2 IMPLANT
GAUZE 4X4 16PLY ~~LOC~~+RFID DBL (SPONGE) ×6 IMPLANT
GLOVE SRG 8 PF TXTR STRL LF DI (GLOVE) ×2 IMPLANT
GLOVE SURG LTX SZ6.5 (GLOVE) ×8 IMPLANT
GLOVE SURG POLYISO LF SZ7 (GLOVE) ×2 IMPLANT
GLOVE SURG POLYISO LF SZ8 (GLOVE) ×2 IMPLANT
GLOVE SURG UNDER POLY LF SZ6.5 (GLOVE) ×2 IMPLANT
GLOVE SURG UNDER POLY LF SZ7 (GLOVE) ×10 IMPLANT
GLOVE SURG UNDER POLY LF SZ8 (GLOVE) ×3
GOWN STRL REUS W/ TWL LRG LVL3 (GOWN DISPOSABLE) ×2 IMPLANT
GOWN STRL REUS W/TWL LRG LVL3 (GOWN DISPOSABLE) ×7 IMPLANT
GOWN STRL REUS W/TWL XL LVL3 (GOWN DISPOSABLE) ×3 IMPLANT
KIT BLADEGUARD II DBL (SET/KITS/TRAYS/PACK) ×3 IMPLANT
KIT TURNOVER KIT A (KITS) ×6 IMPLANT
MANIFOLD NEPTUNE II (INSTRUMENTS) ×3 IMPLANT
NS IRRIG 1000ML POUR BTL (IV SOLUTION) ×6 IMPLANT
PACK ABDOMINAL MAJOR (CUSTOM PROCEDURE TRAY) ×3 IMPLANT
PAD ARMBOARD 7.5X6 YLW CONV (MISCELLANEOUS) ×6 IMPLANT
PENCIL SMOKE EVACUATOR (MISCELLANEOUS) ×3 IMPLANT
POWDER SURGICEL 3.0 GRAM (HEMOSTASIS) ×2 IMPLANT
RETRACTOR WOUND ALXS 18CM MED (MISCELLANEOUS) ×2 IMPLANT
RTRCTR WOUND ALEXIS O 18CM MED (MISCELLANEOUS) ×3
SET BASIN LINEN APH (SET/KITS/TRAYS/PACK) ×4 IMPLANT
SOL PREP POV-IOD 4OZ 10% (MISCELLANEOUS) ×3 IMPLANT
SPONGE T-LAP 18X18 ~~LOC~~+RFID (SPONGE) ×8 IMPLANT
SUT CHROMIC 0 CT 1 (SUTURE) ×2 IMPLANT
SUT PLAIN CT 1/2CIR 2-0 27IN (SUTURE) ×2 IMPLANT
SUT VIC AB 0 CT1 27 (SUTURE) ×12
SUT VIC AB 0 CT1 27XCR 8 STRN (SUTURE) ×6 IMPLANT
SUT VIC AB 0 CT1 36 (SUTURE) ×3 IMPLANT
SUT VIC AB 0 CTX 36 (SUTURE) ×9
SUT VIC AB 0 CTX36XBRD ANTBCTR (SUTURE) ×5 IMPLANT
SUT VIC AB 2-0 CT1 27 (SUTURE) ×3
SUT VIC AB 2-0 CT1 TAPERPNT 27 (SUTURE) ×2 IMPLANT
SUT VICRYL 3 0 (SUTURE) ×3 IMPLANT
SUT VICRYL AB 2 0 TIES (SUTURE) ×2 IMPLANT
TOWEL ~~LOC~~+RFID 17X26 BLUE (SPONGE) ×3 IMPLANT
TRAY FOLEY W/BAG SLVR 16FR (SET/KITS/TRAYS/PACK) ×3
TRAY FOLEY W/BAG SLVR 16FR ST (SET/KITS/TRAYS/PACK) ×2 IMPLANT

## 2021-05-13 NOTE — Anesthesia Preprocedure Evaluation (Signed)
Anesthesia Evaluation  ?Patient identified by MRN, date of birth, ID band ?Patient awake ? ? ? ?Reviewed: ?Allergy & Precautions, H&P , NPO status , Patient's Chart, lab work & pertinent test results, reviewed documented beta blocker date and time  ? ?Airway ?Mallampati: II ? ?TM Distance: >3 FB ?Neck ROM: full ? ? ? Dental ?no notable dental hx. ? ?  ?Pulmonary ?neg pulmonary ROS, Current Smoker and Patient abstained from smoking.,  ?  ?Pulmonary exam normal ?breath sounds clear to auscultation ? ? ? ? ? ? Cardiovascular ?Exercise Tolerance: Good ?hypertension, (-) dysrhythmias  ?Rhythm:regular Rate:Normal ? ? ?  ?Neuro/Psych ?PSYCHIATRIC DISORDERS Anxiety Depression negative neurological ROS ?   ? GI/Hepatic ?negative GI ROS, Neg liver ROS,   ?Endo/Other  ?Morbid obesity ? Renal/GU ?negative Renal ROS  ?negative genitourinary ?  ?Musculoskeletal ? ? Abdominal ?  ?Peds ? Hematology ? ?(+) Blood dyscrasia, anemia ,   ?Anesthesia Other Findings ? ? Reproductive/Obstetrics ?negative OB ROS ? ?  ? ? ? ? ? ? ? ? ? ? ? ? ? ?  ?  ? ? ? ? ? ? ? ? ?Anesthesia Physical ?Anesthesia Plan ? ?ASA: 3 ? ?Anesthesia Plan: General and General ETT  ? ?Post-op Pain Management:   ? ?Induction:  ? ?PONV Risk Score and Plan: Ondansetron ? ?Airway Management Planned:  ? ?Additional Equipment:  ? ?Intra-op Plan:  ? ?Post-operative Plan:  ? ?Informed Consent: I have reviewed the patients History and Physical, chart, labs and discussed the procedure including the risks, benefits and alternatives for the proposed anesthesia with the patient or authorized representative who has indicated his/her understanding and acceptance.  ? ? ? ?Dental Advisory Given ? ?Plan Discussed with: CRNA ? ?Anesthesia Plan Comments:   ? ? ? ? ? ? ?Anesthesia Quick Evaluation ? ?

## 2021-05-13 NOTE — Anesthesia Procedure Notes (Signed)
Procedure Name: Intubation ?Date/Time: 05/13/2021 7:33 AM ?Performed by: Maude Leriche, CRNA ?Pre-anesthesia Checklist: Patient identified, Emergency Drugs available, Suction available and Patient being monitored ?Patient Re-evaluated:Patient Re-evaluated prior to induction ?Oxygen Delivery Method: Circle system utilized ?Preoxygenation: Pre-oxygenation with 100% oxygen ?Induction Type: IV induction ?Ventilation: Mask ventilation without difficulty ?Laryngoscope Size: Sabra Heck and 2 ?Grade View: Grade II ?Tube type: Oral ?Tube size: 7.0 mm ?Number of attempts: 1 ?Airway Equipment and Method: Stylet and Bite block ?Placement Confirmation: ETT inserted through vocal cords under direct vision, positive ETCO2 and breath sounds checked- equal and bilateral ?Secured at: 21 cm ?Tube secured with: Tape ?Dental Injury: Teeth and Oropharynx as per pre-operative assessment  ? ? ? ? ?

## 2021-05-13 NOTE — Telephone Encounter (Signed)
[facility] sent message that Dr. Charlotta Newton sent in Oxycodone 5 mg capsules and they only have tablets. Requesting that new rx be sent in for tablets. Spoke with Dr. Despina Hidden and he will send in meds for patient.  ?

## 2021-05-13 NOTE — Discharge Instructions (Addendum)
Post Operative Pain Med Plan: ? ?>Take gabapentin 300 mg three times per day, as prescribed for 4 days, try to space them evenly ? ?>Take the oxycodone- 1 tablet, on a schedule, around the clock, every 6 hours(set your phone alarm) for the first 2 days, there may be times when you will need 2 tablets but taking them on a schedule will decrease this need ? ?>You can also take Tylenol together with the oxycodone.  If the oxycodone seems "to strong" then just take Tylenol ? ?>Oxycodone will cause constipation, please be sure to take a stool softener (Colace) twice daily while taking this pain medication and/or continue this medication until your bowel regimen returns to normal ? ?>Take the Toradol every 8 hours for the first 3 days then the remainder to supplement the pain as needed ? ?>After the toradol is gone switch to Ibuprofen 600mg  every 6 hours as needed  ?(DO NOT take Toradol and Ibuprofen at the same time) ? ?If possible try to take the Toradol or Ibuprofen with food to help avoid upsetting your stomach ? ?>Use a heating pad as well as needed ? ?>Nausea: Take sips of ginger ale or soda.  I have also sent a prescription for zofran (ondansetron) for nausea to take if needed over the first couple of days ? ?>Be gentle with your diet the first few days, liquids and soft non spicy food, fruits are great ? ?>Get up and move, no lifting or straining ? ? ?HOME INSTRUCTIONS ? ?Please note any unusual or excessive bleeding, pain, swelling. Mild dizziness or drowsiness are normal for about 24 hours after surgery. ?  ?Shower when comfortable ? ?Restrictions: No driving for 24 hours or while taking pain medications. ? ?Activity:  No heavy lifting (> 10 lbs), nothing in vagina (no tampons, douching, or intercourse) x 4 weeks; no tub baths for 4 weeks ?Vaginal spotting is expected but if your bleeding is heavy, period like,  please call the office ?  ?Incision: Please removed the honeycomb dressing in about 5 days.  The  bandaids will fall off when they are ready to; you may clean your incision with mild soap and water but do not rub or scrub the incision site.  You may experience slight bloody drainage from your incision periodically.  This is normal.  If you experience a large amount of drainage or the incision opens, please call your physician who will likely direct you to the emergency department. ? ?Diet:  You may return to your regular diet.  Do not eat large meals.  Eat small frequent meals throughout the day.  Continue to drink a good amount of water at least 6-8 glasses of water per day, hydration is very important for the healing process. ? ?Pain Management: See above instructions regarding pain management.   ?Always take prescription pain medication with food.  Percocet may cause constipation, you may want to take a stool softener while taking this medication.  A prescription of colace has been sent in to take twice daily if needed while taking the oxycodone.  Be sure to drink plenty of fluids and increase your fiber to help with constipation. ? ?Alcohol -- Avoid for 24 hours and while taking pain medications. ? ?Fever -- Call physician if temperature over 101 degrees ? ?Follow up:  If you do not already have a follow up appointment scheduled, please call the office at [phone].  If you experience fever (a temperature greater than 100.4), pain unrelieved by pain medication,  shortness of breath, swelling of a single leg, or any other symptoms which are concerning to you please the office immediately.   ?  ?

## 2021-05-13 NOTE — Op Note (Signed)
Preop Dx: Abnormal uterine bleeding ?Postop Dx: same ?Procedure: Total abdominal hysterectomy ?Surgeon: Dr. Myna Hidalgo ?Assistant: Dr. Turner Daniels ?Anesthesia:  general ?Complications: none ?EBL: 150cc ?Fluids: 800cc ?UOP: 200cc ? ?Findings: 7cm uterus with normal ovaries.  Fallopian tubes absent.  Moderate adhesions noted of the omentum to the anterior abdominal wall.  Adhesions noted between lower uterine segment and bladder. ? ?Specimens: Uterus with cervix ? ?Procedure: ? ?The patient was taken to the operating room where a general anesthetic was given. A timeout was performed confirming the appropriate procedure. The patient's abdomen was prepped with DuraPrep. Her perineum and vagina were prepped with Betadine. A Foley catheter was placed in the bladder. The patient was sterilely draped. A low transverse incision was made in the lower abdomen and carried sharply through the subcutaneous tissue to the fascia.  The fascia was incised and extended laterally.  The rectus muscle was separated in the midline and the peritoneum was exposed.  The peritoneum was entered sharply and the incision was extended superiorly and inferiorly with the Mezenbaum scissors.  Adhesions were noted between the omentum and anterior abdominal wall.  Kelly clamps were used to clamp, cut and suture ligate the omentum.  A small Alexis retractor was placed.  The bowel packed away using moist sponges.  The round ligament was identified on the left. The round ligament was suture ligated and cut and the incision was carried down to create the vesicouterine flap. A window was created in an avascular area of the posterior leaf.  The ovarian ligament was clamped, cut and doubly ligated.  An identical procedure was carried out on the opposite side. Both sharp and blunt dissection were used to separate the adhesions between the bladder and lower uterine segment.  The bladder flap was developed anteriorly.  The uterine arteries were skeletonized,  doubly clamped, cut and suture ligated.  The cardinal ligaments and uterosacral ligaments were clamped, cut and suture ligated.  Curved heany clamps were placed at the angles of the vagina, the uterus was removed. The vaginal cuff was closed with a continuous-running suture.  The pelvis was irrigated.  The vaginal cuff was reinforced with figure of eight stitches. Hemostasis was confirmed.   ?The pelvis was irrigated. Hemostasis was again confirmed and Surgicel was placed over the vaginal cuff and adnexa. ?The bowel was unpacked. All instruments and packs were removed from the abdominal cavity. The rectus muscle was reapproximated in the midline using 2-0 chromic in an interrupted fashion.   The fascia was closed using a running suture. Zynrelief was placed. The subcutaneous layer was closed using 2-0 plain gut. The skin was reapproximated using 4-0 vicryl on a Keith needle.   ?Sponge, needle, and instrument counts were correct.  The patient tolerated the procedure well. She was awakened from her anesthetic without difficulty. She was transported to the recovery room in stable condition. She was noted to drain clear yellow urine. All specimens were sent to pathology.  ? ?An experienced assistant was required given the standard of surgical care given the complexity of the case.  This assistant was needed for exposure, dissection, suctioning, retraction, instrument exchange. ? ?Myna Hidalgo, DO ?Attending Obstetrician & Gynecologist, Faculty Practice ?Center for [employer], [facility] ? ? ?

## 2021-05-13 NOTE — Anesthesia Postprocedure Evaluation (Signed)
Anesthesia Post Note ? ?Patient: Paula Myers ? ?Procedure(s) Performed: HYSTERECTOMY ABDOMINAL (Abdomen) ? ?Patient location during evaluation: Phase II ?Anesthesia Type: General ?Level of consciousness: awake ?Pain management: pain level controlled ?Vital Signs Assessment: post-procedure vital signs reviewed and stable ?Respiratory status: spontaneous breathing and respiratory function stable ?Cardiovascular status: blood pressure returned to baseline and stable ?Postop Assessment: no headache and no apparent nausea or vomiting ?Anesthetic complications: no ?Comments: Late entry ? ? ?No notable events documented. ? ? ?Last Vitals:  ?Vitals:  ? 05/13/21 1130 05/13/21 1212  ?BP: (!) 146/81 131/79  ?Pulse: 81 85  ?Resp: 17 14  ?Temp:  36.8 ?C  ?SpO2: 93% 97%  ?  ?Last Pain:  ?Vitals:  ? 05/13/21 1212  ?TempSrc: Oral  ?PainSc: 7   ? ? ?  ?  ?  ?  ?  ?  ? ?Windell [place] ? ? ? ? ?

## 2021-05-13 NOTE — Telephone Encounter (Signed)
Pt informed via mychart message that Dr. Despina Hidden had sent in oxycodone tablets.  ?

## 2021-05-13 NOTE — Transfer of Care (Signed)
Immediate Anesthesia Transfer of Care Note ? ?Patient: Paula Myers ? ?Procedure(s) Performed: HYSTERECTOMY ABDOMINAL (Abdomen) ? ?Patient Location: PACU ? ?Anesthesia Type:General ? ?Level of Consciousness: awake, alert  and oriented ? ?Airway & Oxygen Therapy: Patient Spontanous Breathing and Patient connected to nasal cannula oxygen ? ?Post-op Assessment: Report given to RN, Post -op Vital signs reviewed and stable, Patient moving all extremities X 4 and Patient able to stick tongue midline ? ?Post vital signs: Reviewed ? ?Last Vitals:  ?Vitals Value Taken Time  ?BP 132/74 05/13/21 0918  ?Temp 97.4   ?Pulse 93 05/13/21 0920  ?Resp 19 05/13/21 0920  ?SpO2 96 % 05/13/21 0920  ?Vitals shown include unvalidated device data. ? ?Last Pain:  ?Vitals:  ? 05/13/21 0648  ?TempSrc: Oral  ?PainSc: 0-No pain  ?   ? ?Patients Stated Pain Goal: 5 (05/13/21 7169) ? ?Complications: No notable events documented. ?

## 2021-05-14 ENCOUNTER — Encounter (HOSPITAL_COMMUNITY): Payer: Self-pay | Admitting: Obstetrics & Gynecology

## 2021-05-15 ENCOUNTER — Encounter: Payer: Commercial Managed Care - PPO | Admitting: Obstetrics & Gynecology

## 2021-05-15 LAB — SURGICAL PATHOLOGY

## 2021-05-19 ENCOUNTER — Encounter: Payer: Self-pay | Admitting: Obstetrics & Gynecology

## 2021-05-19 ENCOUNTER — Other Ambulatory Visit: Payer: Self-pay | Admitting: Adult Health

## 2021-05-21 ENCOUNTER — Other Ambulatory Visit: Payer: Self-pay

## 2021-05-21 ENCOUNTER — Ambulatory Visit (INDEPENDENT_AMBULATORY_CARE_PROVIDER_SITE_OTHER): Payer: Commercial Managed Care - PPO | Admitting: Obstetrics & Gynecology

## 2021-05-21 ENCOUNTER — Encounter: Payer: Self-pay | Admitting: Obstetrics & Gynecology

## 2021-05-21 VITALS — BP 137/83 | HR 70 | Ht 65.0 in | Wt 256.0 lb

## 2021-05-21 DIAGNOSIS — Z4889 Encounter for other specified surgical aftercare: Secondary | ICD-10-CM

## 2021-05-21 NOTE — Progress Notes (Signed)
? ? ?  PostOp Visit Note ? ?Paula Myers is a 36 y.o. [phone] female who presents for a postoperative visit. She is 1 week postop following a TAH completed on 3/14  ? ?Today she notes that she had an episode of chills a few nights ago, but nothing since.  Had low grade temp x 1 that has also resolved.   ?Tolerating gen diet.  +Flatus, Regular BMs.  Pain is well controlled- no longer taking oxycodone for the past 2 days.  Overall doing well and reports no acute complaints ? ? ?Review of Systems ?Pertinent items are noted in HPI.   ? ?Objective:  ?BP 137/83 (BP Location: Right Arm, Patient Position: Sitting, Cuff Size: Large)   Pulse 70   Ht 5\' 5"  (1.651 m)   Wt 256 lb (116.1 kg)   LMP 04/30/2021   BMI 42.60 kg/m?   ? ?Physical Examination:  ?GENERAL ASSESSMENT: well developed and well nourished ?SKIN: warm and dry ?CHEST: CTAB ?HEART: regular rate and rhythm ?ABDOMEN: soft, non-distended, +BS ?INCISION: healing appropriately- C/D/I with dermabond ?EXTREMITY: no edema, no calf tenderness bilaterally ?PSYCH: mood appropriate, normal affect ?      ?Assessment:  ? ? Postop visit  ? ?Plan:  ? ?-meeting milestones appropriately ?-reviewed precautions/restrictions ?-f/u in 4 wks for final postop visit ? ?Janyth Pupa, DO ?Attending [place], Faculty Practice ?Center for [facility] ? ?  ?

## 2021-05-30 LAB — TSH: TSH: 18.7 u[IU]/mL — ABNORMAL HIGH (ref 0.450–4.500)

## 2021-05-30 LAB — T4, FREE: Free T4: 0.88 ng/dL (ref 0.82–1.77)

## 2021-05-30 MED ORDER — LEVOTHYROXINE SODIUM 150 MCG PO TABS: 150.0000 ug | ORAL_TABLET | Freq: Every day | ORAL | 3 refills | Status: DC

## 2021-05-30 NOTE — Progress Notes (Signed)
Her thyroid labs are still consistent with under-replacement.  We need to increase her dose of Levothyroxine to 150 mcg po daily.  I will send in a new script to her pharmacy.

## 2021-05-30 NOTE — Addendum Note (Signed)
Addended by: Dani Gobble on: 05/30/2021 07:14 AM ? ? Modules accepted: Orders ? ?

## 2021-06-06 ENCOUNTER — Encounter: Payer: Self-pay | Admitting: Obstetrics & Gynecology

## 2021-06-11 ENCOUNTER — Encounter (HOSPITAL_COMMUNITY): Payer: Self-pay | Admitting: Hematology

## 2021-06-11 ENCOUNTER — Ambulatory Visit (INDEPENDENT_AMBULATORY_CARE_PROVIDER_SITE_OTHER): Payer: Commercial Managed Care - PPO | Admitting: Obstetrics & Gynecology

## 2021-06-11 ENCOUNTER — Encounter: Payer: Self-pay | Admitting: Obstetrics & Gynecology

## 2021-06-11 VITALS — BP 107/75 | HR 82 | Wt 255.8 lb

## 2021-06-11 DIAGNOSIS — Z4889 Encounter for other specified surgical aftercare: Secondary | ICD-10-CM

## 2021-06-11 NOTE — Progress Notes (Signed)
? ?  GYN VISIT ?Patient name: Paula Myers MRN [id number]  Date of birth: 14-Apr-1985 ?Chief Complaint:   ?open area at incision site ? ?History of Present Illness:   ?Paula Myers is a 36 y.o. [phone] female being seen today for postop concern ? ?S/p TAH on 05/13/20 ? ?Today she notes a black spot on the side of her incision and was concerned.  She also noted occasional oozing.  No F/C/CP/SOB.  Tolerating gen diet.  No nausea/vomiting.  Voiding and using the bathroom without difficulites.    ? ?Patient's last menstrual period was 04/30/2021. ? ? ?  03/14/2021  ? 10:23 AM 11/21/2020  ?  9:12 AM 11/07/2020  ? 10:10 AM 10/07/2020  ?  9:15 AM 10/02/2016  ?  3:06 PM  ?Depression screen PHQ 2/9  ?Decreased Interest 0 0 0 0 1  ?Down, Depressed, Hopeless 1 0 0 0 1  ?PHQ - 2 Score 1 0 0 0 2  ?Altered sleeping 1    1  ?Tired, decreased energy 3    2  ?Change in appetite 2    2  ?Feeling bad or failure about yourself  0    2  ?Trouble concentrating 1    1  ?Moving slowly or fidgety/restless 0    1  ?Suicidal thoughts 0    0  ?PHQ-9 Score 8    11  ?Difficult doing work/chores     Somewhat difficult  ? ? ? ?Review of Systems:   ?Pertinent items are noted in HPI ?Denies fever/chills, dizziness, headaches, visual disturbances, fatigue, shortness of breath, chest pain, abdominal pain, vomiting, bowel movements, urination, or intercourse unless otherwise stated above.  ?Pertinent History Reviewed:  ?Reviewed past medical,surgical, social, obstetrical and family history.  ?Reviewed problem list, medications and allergies. ?Physical Assessment:  ? ?Vitals:  ? 06/11/21 1329  ?BP: 107/75  ?Pulse: 82  ?Weight: 255 lb 12.8 oz (116 kg)  ?Body mass index is 42.57 kg/m?. ? ?     Physical Examination:  ? General appearance: alert, well appearing, and in no distress ? Psych: mood appropriate, normal affect ? Skin: warm & dry  ? Cardiovascular: normal heart rate noted ? Respiratory: normal respiratory effort, no distress ? Abdomen: soft,  non-tender ?Incision: C/D/I- Dark spot- noted to be vicryl stitch/knot- suture grasped and cut without difficulty.  Incision healing appropriately- no evidence of infection or dehiscence ? Extremities: no edema, no calf tenderness ? ?Chaperone: N/A   ? ?Assessment & Plan:  ?1) Postop incision check ?-doing well meeting milestones appropriately ? ? ?No orders of the defined types were placed in this encounter. ? ? ?No follow-ups on file. ? ? ?Janyth Pupa, DO ?Attending [place], Faculty Practice ?Center for [place] ? ? ? ?

## 2021-06-17 ENCOUNTER — Ambulatory Visit (INDEPENDENT_AMBULATORY_CARE_PROVIDER_SITE_OTHER): Payer: Commercial Managed Care - PPO | Admitting: Nurse Practitioner

## 2021-06-17 ENCOUNTER — Encounter: Payer: Self-pay | Admitting: Nurse Practitioner

## 2021-06-17 DIAGNOSIS — E0590 Thyrotoxicosis, unspecified without thyrotoxic crisis or storm: Secondary | ICD-10-CM

## 2021-06-17 DIAGNOSIS — F17200 Nicotine dependence, unspecified, uncomplicated: Secondary | ICD-10-CM

## 2021-06-17 DIAGNOSIS — I10 Essential (primary) hypertension: Secondary | ICD-10-CM | POA: Diagnosis not present

## 2021-06-17 DIAGNOSIS — Z720 Tobacco use: Secondary | ICD-10-CM | POA: Insufficient documentation

## 2021-06-17 NOTE — Patient Instructions (Signed)

## 2021-06-17 NOTE — Assessment & Plan Note (Signed)
Wt Readings from Last 3 Encounters:  ?06/17/21 254 lb (115.2 kg)  ?06/11/21 255 lb 12.8 oz (116 kg)  ?05/21/21 256 lb (116.1 kg)  ?State that she has not been exercising due to recent surgery. ? ?Need  to increase intake of whole food consisting mainly of vegetables, proteins less carbohyrates drink at least 64 ounces of water daily, engaging in regular vigorous exercise 30 minutes daily, importance of portion control and discussed with patient today. ?Has family history of thyroid cancer, for this reason I will not consider wegovy  ?I referred patient to weight management clinic today  ?

## 2021-06-17 NOTE — Assessment & Plan Note (Signed)
BP Readings from Last 3 Encounters:  ?06/17/21 124/88  ?06/11/21 107/75  ?05/21/21 137/83  ?She has stopped taking hydrochlorothiazide 12.5 mg daily since about 5 weeks ago ?She has been monitoring blood pressure at home and stated her blood pressure readings have been normal. ?Okay to stop taking hydrochlorothiazide, blood pressure normal in the office today ?DASH diet advised, engage in regular vigorous exercises ? ?

## 2021-06-17 NOTE — Assessment & Plan Note (Signed)
Smokes a quarter of a pack of cigarettes  daily ?States that she has quit on and off in the past ?Need to quit smoking including risk of lung cancer stroke heart disease discussed with patient ?Patient stated that she will work on quitting smoking, refused pharmacologic therapy today, has used nicotine gum in the past.  ?

## 2021-06-17 NOTE — Progress Notes (Signed)
? ?Paula Myers     MRN: [id number]      DOB: 24-Feb-1986 ? ? ?HPI ?Paula Myers with past medical history of hypertension, hypothyroidism, morbid obesity, iron deficiency anemia, menorrhagia with regular cycle is here for follow up and re-evaluation of chronic medical conditions and to discuss weight loss ? ? ? ?Patient stated that she took phentermine  for weight loss years ago and did well with it.  She has not been exercising due to recent  partial hysterectomy for anemia.  She would like medication to assist with weight loss, levothyroxine 150 mcg tablets daily for hypothyroidism followed by endocrinology.  Has family history of thyroid cancer.  ? ?Patient stated that she has stopped taking her blood pressure med since 5 weeks ago, states that she has been monitoring her blood pressure at home blood pressure as being normal, denies chest pain, shortness of breath, edema, dizziness.  ? ?Smokes a quarter a pack of cigarettes daily, has been smoking on and off since about 20 years ago. She has used nicotine gum and has had to quit smoking whenever she was pregnant.  Need to quit smoking including risk of lung cancer, heart disease, MI discussed with patient today.  Stated she will continue to work on quitting.  ? ? ? ?ROS ?Denies recent fever or chills. ?Denies sinus pressure, nasal congestion, ear pain or sore throat. ?Denies chest congestion, productive cough or wheezing. ?Denies chest pains, palpitations and leg swelling ?Denies abdominal pain, nausea, vomiting,diarrhea or constipation.   ?Denies dysuria, frequency, hesitancy or incontinence. ?Denies joint pain, swelling and limitation in mobility. ?Denies depression, anxiety or insomnia. ? ? ? ?PE ? ?BP 124/88 (BP Location: Right Arm, Cuff Size: Large)   Pulse 73   Ht 5\' 5"  (1.651 m)   Wt 254 lb (115.2 kg)   LMP 04/30/2021   SpO2 99%   BMI 42.27 kg/m?  ? ?Patient alert and oriented and in no cardiopulmonary distress. ? ?Chest: Clear to auscultation  bilaterally. ? ?CVS: S1, S2 no murmurs, no S3.Regular rate. ? ?ABD: Soft non tender.  ? ?Ext: No edema ? ?MS: Adequate ROM spine, shoulders, hips and knees. ? ?Skin: Intact, no ulcerations or rash noted. ? ?Psych: Good eye contact, normal affect. Memory intact not anxious or depressed appearing. ? ? ? ? ?Assessment & Plan ? ?Hypertension ?BP Readings from Last 3 Encounters:  ?06/17/21 124/88  ?06/11/21 107/75  ?05/21/21 137/83  ?She has stopped taking hydrochlorothiazide 12.5 mg daily since about 5 weeks ago ?She has been monitoring blood pressure at home and stated her blood pressure readings have been normal. ?Okay to stop taking hydrochlorothiazide, blood pressure normal in the office today ?DASH diet advised, engage in regular vigorous exercises ? ? ?Hyperthyroidism ?On levothyroxine 150 mcg tablets daily ?Managed by Endo ?Patient encouraged to maintain close follow-up within the verbalized understanding ? ?Morbid obesity (HCC) ?Wt Readings from Last 3 Encounters:  ?06/17/21 254 lb (115.2 kg)  ?06/11/21 255 lb 12.8 oz (116 kg)  ?05/21/21 256 lb (116.1 kg)  ?State that she has not been exercising due to recent surgery. ? ?Need  to increase intake of whole food consisting mainly of vegetables, proteins less carbohyrates drink at least 64 ounces of water daily, engaging in regular vigorous exercise 30 minutes daily, importance of portion control and discussed with patient today. ?Has family history of thyroid cancer, for this reason I will not consider wegovy  ?I referred patient to weight management clinic today  ? ?Current smoker ?  Smokes a quarter of a pack of cigarettes  daily ?States that she has quit on and off in the past ?Need to quit smoking including risk of lung cancer stroke heart disease discussed with patient ?Patient stated that she will work on quitting smoking, refused pharmacologic therapy today, has used nicotine gum in the past.   ?

## 2021-06-17 NOTE — Assessment & Plan Note (Signed)
On levothyroxine 150 mcg tablets daily ?Managed by Endo ?Patient encouraged to maintain close follow-up within the verbalized understanding ?

## 2021-06-18 ENCOUNTER — Encounter: Payer: Self-pay | Admitting: Obstetrics & Gynecology

## 2021-06-18 ENCOUNTER — Ambulatory Visit (INDEPENDENT_AMBULATORY_CARE_PROVIDER_SITE_OTHER): Payer: Commercial Managed Care - PPO | Admitting: Obstetrics & Gynecology

## 2021-06-18 VITALS — BP 130/89 | HR 78 | Ht 65.0 in | Wt 255.8 lb

## 2021-06-18 DIAGNOSIS — Z4889 Encounter for other specified surgical aftercare: Secondary | ICD-10-CM

## 2021-06-18 NOTE — Progress Notes (Signed)
? ? ?  PostOp Visit Note ? ?Paula Myers is a 36 y.o. [phone] female who presents for a postoperative visit. She is 6 week postop following a TAH completed on 05/13/21  ? ?Today she notes that she is doing great, feels ready to return to her regular activity. ?Denies fever or chills.  Tolerating gen diet.  +Flatus, Regular BMs.  Pain is minimal. ?Overall doing well and reports no acute complaints ? ? ?Review of Systems ?Pertinent items are noted in HPI.   ? ?Objective:  ?BP 130/89 (BP Location: Right Arm, Patient Position: Sitting, Cuff Size: Large)   Pulse 78   Ht 5\' 5"  (1.651 m)   Wt 255 lb 12.8 oz (116 kg)   LMP 04/30/2021   BMI 42.57 kg/m?   ? ?Physical Examination:  ?GENERAL ASSESSMENT: well developed and well nourished ?SKIN: normal color, no lesions ?CHEST: normal air exchange, respiratory effort normal with no retractions ?HEART: regular rate and rhythm ?ABDOMEN: soft, non-distended, non-tender ?INCISION: C/D/I- well healed ?GU: Vaginal cuff intact and well healed ?EXTREMITY: no edema, no calf tenderness bilaterally ?PSYCH: mood appropriate, normal affect ?      ?Assessment:  ? ? Postop viist  ? ?Plan:  ? ?-meeting milestones appropriately ?-may return to regular activity including intercourse ?-may return to work ? ?Janyth Pupa, DO ?Attending [place], Faculty Practice ?Center for [place] ? ?  ?

## 2021-06-27 ENCOUNTER — Encounter: Payer: Self-pay | Admitting: Nurse Practitioner

## 2021-06-27 ENCOUNTER — Encounter: Payer: Self-pay | Admitting: Internal Medicine

## 2021-06-27 ENCOUNTER — Ambulatory Visit (INDEPENDENT_AMBULATORY_CARE_PROVIDER_SITE_OTHER): Payer: Commercial Managed Care - PPO | Admitting: Internal Medicine

## 2021-06-27 DIAGNOSIS — N3000 Acute cystitis without hematuria: Secondary | ICD-10-CM

## 2021-06-27 LAB — POCT URINALYSIS DIP (CLINITEK)
Bilirubin, UA: NEGATIVE
Glucose, UA: NEGATIVE mg/dL
Ketones, POC UA: NEGATIVE mg/dL
Leukocytes, UA: NEGATIVE
Nitrite, UA: NEGATIVE
POC PROTEIN,UA: NEGATIVE
Spec Grav, UA: 1.025 (ref 1.010–1.025)
Urobilinogen, UA: 0.2 E.U./dL
pH, UA: 6.5 (ref 5.0–8.0)

## 2021-06-27 MED ORDER — NITROFURANTOIN MONOHYD MACRO 100 MG PO CAPS: 100.0000 mg | ORAL_CAPSULE | Freq: Two times a day (BID) | ORAL | 0 refills | Status: DC

## 2021-06-27 NOTE — Progress Notes (Signed)
?  ? ?Virtual Visit via Telephone Note  ? ?This visit type was conducted due to national recommendations for restrictions regarding the COVID-19 Pandemic (e.g. social distancing) in an effort to limit this patient's exposure and mitigate transmission in our community.  Due to her co-morbid illnesses, this patient is at least at moderate risk for complications without adequate follow up.  This format is felt to be most appropriate for this patient at this time.  The patient did not have access to video technology/had technical difficulties with video requiring transitioning to audio format only (telephone).  All issues noted in this document were discussed and addressed.  No physical exam could be performed with this format. ? ?Evaluation Performed:  Follow-up visit ? ?Date:  06/27/2021  ? ?ID:  Paula Myers, DOB [birth year], MRN [id number] ? ?Patient Location: Home ?Provider Location: Office/Clinic ? ?Participants: Patient ?Location of Patient: Home ?Location of Provider: Telehealth ?Consent was obtain for visit to be over via telehealth. ?I verified that I am speaking with the correct person using two identifiers. ? ?PCP:  Donell Beers, FNP  ? ?Chief Complaint: Dysuria and lower abdominal pressure ? ?History of Present Illness:   ? ?Paula Myers is a 36 y.o. female who has a televisit for complaint of dysuria and lower abdominal pressure for the last 3 days.  She denies any fever, chills, nausea or vomiting currently.  Denies any hematuria currently. ? ?The patient does not have symptoms concerning for COVID-19 infection (fever, chills, cough, or new shortness of breath).  ? ?Past Medical, Surgical, Social History, Allergies, and Medications have been Reviewed. ? ?Past Medical History:  ?Diagnosis Date  ? Anemia   ? prior pregnancies  ? Anxiety   ? Depression   ? Dysrhythmia   ? "skipping" HR at beginning of pregnancy  ? H/O cervical polypectomy   ? Heartburn during pregnancy   ? History of  cesarean section 08/22/2014  ? Hypertension 2005  ? while in labor  ? Hyperthyroidism   ? Iron deficiency anemia due to chronic blood loss 11/21/2020  ? Kidney stone   ? Palpitation   ? SVT (supraventricular tachycardia) (HCC)   ? SVT (supraventricular tachycardia) (HCC)   ? Tachycardia   ? ?Past Surgical History:  ?Procedure Laterality Date  ? ABDOMINAL HYSTERECTOMY N/A 05/13/2021  ? Procedure: HYSTERECTOMY ABDOMINAL;  Surgeon: Myna Hidalgo, DO;  Location: [facility];  Service: Gynecology;  Laterality: N/A;  ? CESAREAN SECTION    ? 06/11/09; 05/25/2003  ? CESAREAN SECTION WITH BILATERAL TUBAL LIGATION Bilateral 08/22/2014  ? Procedure: CESAREAN SECTION WITH BILATERAL TUBAL LIGATION;  Surgeon: Essie Hart, MD;  Location: [facility];  Service: Obstetrics;  Laterality: Bilateral;  ? DILATION AND CURETTAGE OF UTERUS  01/2008  ? ELBOW SURGERY    ? Left  ?  ? ?Current Meds  ?Medication Sig  ? acetaminophen (TYLENOL) 325 MG tablet Take 2 tablets (650 mg total) by mouth every 6 (six) hours as needed for moderate pain or mild pain.  ? hydrochlorothiazide (MICROZIDE) 12.5 MG capsule TAKE 1 CAPSULE(12.5 MG) BY MOUTH DAILY  ? ibuprofen (ADVIL) 600 MG tablet Take 1 tablet (600 mg total) by mouth every 6 (six) hours as needed.  ? levothyroxine (SYNTHROID) 150 MCG tablet Take 1 tablet (150 mcg total) by mouth daily.  ?  ? ?Allergies:   Levaquin [levofloxacin in d5w], Levofloxacin, Pertussis vaccines, Megace [megestrol], Tetanus-diphth-acell pertussis, and Venofer [iron sucrose]  ? ?ROS:   ?Please see the history of present  illness.    ? ?All other systems reviewed and are negative. ? ? ?Labs/Other Tests and Data Reviewed:   ? ?Recent Labs: ?11/05/2020: ALT 22 ?05/09/2021: BUN 9; Creatinine, Ser 0.84; Hemoglobin 15.1; Platelets 282; Potassium 4.3; Sodium 139 ?05/29/2021: TSH 18.700  ? ?Recent Lipid Panel ?Lab Results  ?Component Value Date/Time  ? CHOL 178 11/05/2020 09:07 AM  ? TRIG 101 11/05/2020 09:07 AM  ? HDL 46 11/05/2020 09:07 AM  ?  LDLCALC 114 (H) 11/05/2020 09:07 AM  ? ? ?Wt Readings from Last 3 Encounters:  ?06/18/21 255 lb 12.8 oz (116 kg)  ?06/17/21 254 lb (115.2 kg)  ?06/11/21 255 lb 12.8 oz (116 kg)  ?  ? ?ASSESSMENT & PLAN:   ? ?UTI ?UA reviewed -negative for LE or nitrite, has trace RBC ?Check urine culture ?Considering her symptoms, will start empiric antibiotic ?Advised to maintain adequate hydration ? ?Time:   ?Today, I have spent 9 minutes reviewing the chart, including problem list, medications, and with the patient with telehealth technology discussing the above problems. ? ? ?Medication Adjustments/Labs and Tests Ordered: ?Current medicines are reviewed at length with the patient today.  Concerns regarding medicines are outlined above.  ? ?Tests Ordered: ?Orders Placed This Encounter  ?Procedures  ? Urine Culture  ? POCT URINALYSIS DIP (CLINITEK)  ? ? ?Medication Changes: ?No orders of the defined types were placed in this encounter. ? ? ? ?Note: This dictation was prepared with Dragon dictation along with smaller phrase technology. Similar sounding words can be transcribed inadequately or may not be corrected upon review. Any transcriptional errors that result from this process are unintentional.  ?  ? ? ?Disposition:  Follow up  ?Signed, ?Anabel Halon, MD  ?06/27/2021 10:02 AM    ? ?[facility] ?[facility] ?

## 2021-06-28 ENCOUNTER — Telehealth (HOSPITAL_COMMUNITY): Payer: Self-pay | Admitting: Student

## 2021-06-28 ENCOUNTER — Other Ambulatory Visit: Payer: Self-pay

## 2021-06-28 ENCOUNTER — Emergency Department (HOSPITAL_COMMUNITY): Payer: Commercial Managed Care - PPO

## 2021-06-28 ENCOUNTER — Encounter (HOSPITAL_COMMUNITY): Payer: Self-pay | Admitting: Emergency Medicine

## 2021-06-28 ENCOUNTER — Emergency Department (HOSPITAL_COMMUNITY)
Admission: EM | Admit: 2021-06-28 | Discharge: 2021-06-28 | Disposition: A | Discharge status: Home / Self Care | Payer: Commercial Managed Care - PPO | Attending: Student | Admitting: Student

## 2021-06-28 DIAGNOSIS — S301XXA Contusion of abdominal wall, initial encounter: Secondary | ICD-10-CM | POA: Diagnosis not present

## 2021-06-28 DIAGNOSIS — F17210 Nicotine dependence, cigarettes, uncomplicated: Secondary | ICD-10-CM | POA: Diagnosis not present

## 2021-06-28 DIAGNOSIS — E039 Hypothyroidism, unspecified: Secondary | ICD-10-CM | POA: Diagnosis not present

## 2021-06-28 DIAGNOSIS — Z79899 Other long term (current) drug therapy: Secondary | ICD-10-CM | POA: Diagnosis not present

## 2021-06-28 DIAGNOSIS — I10 Essential (primary) hypertension: Secondary | ICD-10-CM | POA: Insufficient documentation

## 2021-06-28 DIAGNOSIS — X58XXXA Exposure to other specified factors, initial encounter: Secondary | ICD-10-CM | POA: Diagnosis not present

## 2021-06-28 DIAGNOSIS — S3991XA Unspecified injury of abdomen, initial encounter: Secondary | ICD-10-CM | POA: Diagnosis present

## 2021-06-28 LAB — CBC WITH DIFFERENTIAL/PLATELET
Abs Immature Granulocytes: 0.04 10*3/uL (ref 0.00–0.07)
Basophils Absolute: 0.1 10*3/uL (ref 0.0–0.1)
Basophils Relative: 1 %
Eosinophils Absolute: 0.1 10*3/uL (ref 0.0–0.5)
Eosinophils Relative: 1 %
HCT: 44.8 % (ref 36.0–46.0)
Hemoglobin: 14.9 g/dL (ref 12.0–15.0)
Immature Granulocytes: 0 %
Lymphocytes Relative: 18 %
Lymphs Abs: 1.8 10*3/uL (ref 0.7–4.0)
MCH: 28.7 pg (ref 26.0–34.0)
MCHC: 33.3 g/dL (ref 30.0–36.0)
MCV: 86.2 fL (ref 80.0–100.0)
Monocytes Absolute: 0.5 10*3/uL (ref 0.1–1.0)
Monocytes Relative: 5 %
Neutro Abs: 7.4 10*3/uL (ref 1.7–7.7)
Neutrophils Relative %: 75 %
Platelets: 348 10*3/uL (ref 150–400)
RBC: 5.2 MIL/uL — ABNORMAL HIGH (ref 3.87–5.11)
RDW: 13.2 % (ref 11.5–15.5)
WBC: 9.9 10*3/uL (ref 4.0–10.5)
nRBC: 0 % (ref 0.0–0.2)

## 2021-06-28 LAB — COMPREHENSIVE METABOLIC PANEL
ALT: 33 U/L (ref 0–44)
AST: 22 U/L (ref 15–41)
Albumin: 4.3 g/dL (ref 3.5–5.0)
Alkaline Phosphatase: 68 U/L (ref 38–126)
Anion gap: 6 (ref 5–15)
BUN: 9 mg/dL (ref 6–20)
CO2: 24 mmol/L (ref 22–32)
Calcium: 8.9 mg/dL (ref 8.9–10.3)
Chloride: 107 mmol/L (ref 98–111)
Creatinine, Ser: 0.74 mg/dL (ref 0.44–1.00)
GFR, Estimated: >60 mL/min (ref >60)
Glucose, Bld: 112 mg/dL — ABNORMAL HIGH (ref 70–99)
Potassium: 4.1 mmol/L (ref 3.5–5.1)
Sodium: 137 mmol/L (ref 135–145)
Total Bilirubin: 0.6 mg/dL (ref 0.3–1.2)
Total Protein: 8 g/dL (ref 6.5–8.1)

## 2021-06-28 LAB — WET PREP, GENITAL
Clue Cells Wet Prep HPF POC: NONE SEEN
Sperm: NONE SEEN
Trich, Wet Prep: NONE SEEN
WBC, Wet Prep HPF POC: <10 (ref <10)
Yeast Wet Prep HPF POC: NONE SEEN

## 2021-06-28 MED ORDER — NYSTATIN 100000 UNIT/GM EX POWD: 1.0000 "application " | Freq: Three times a day (TID) | CUTANEOUS | 0 refills | Status: DC

## 2021-06-28 MED ORDER — IOHEXOL 300 MG/ML  SOLN
100.0000 mL | Freq: Once | INTRAMUSCULAR | Status: AC | PRN
  Administered 2021-06-28: 100 mL via INTRAVENOUS

## 2021-06-28 NOTE — Telephone Encounter (Signed)
Note created to send in nystatin powder ?

## 2021-06-28 NOTE — ED Notes (Signed)
Patient transported to CT 

## 2021-06-28 NOTE — ED Triage Notes (Addendum)
Per patient had hysterectomy 6 weeks ago with final check 10 days ago and was cleared. Patient states had pain with intercourse 8 days ago. Patient started having lower abd pain with nausea and diarrhea x5 days ago with serosanguinous drainage and redness at incision site. Per patient seen by PCP on Friday and checked for UTI due to abd pain and post voiding pain. UA and urine culture done, negative for UTI but placed on Macrobid till culture returns. Patient states today some heavy clear vaginal discharge and some spotting. Per patient spotting x2 when using the bathroom. Reports chills but denies any fevers.  ?

## 2021-06-28 NOTE — ED Provider Notes (Signed)
?[facility] EMER[facility] DEPARTMENT ?Provider Note ? ?CSN: [id number] ?Arrival date & time: 06/28/21 1815 ? ?Chief Complaint(s) ?Post-op Problem ? ?HPI ?Paula Myers is a 36 y.o. female who is 6 weeks postop from hysterectomy who presents emergency department for evaluation of abdominal pain and vaginal spotting.  She states that she was recently cleared by her OB/GYN but over the last week has had some mild crampy abdominal pain near the incision site, serous drainage from the incisional site and new vaginal spotting that started today.  She states that she has resumed sexual intercourse but has not had sexual intercourse in 1 week.  Denies chest pain, shortness of breath, nausea, vomiting, fever or other systemic symptoms. ? ? ?Past Medical History ?Past Medical History:  ?Diagnosis Date  ? Anemia   ? prior pregnancies  ? Anxiety   ? Depression   ? Dysrhythmia   ? "skipping" HR at beginning of pregnancy  ? H/O cervical polypectomy   ? Heartburn during pregnancy   ? History of cesarean section 08/22/2014  ? Hypertension 2005  ? while in labor  ? Hyperthyroidism   ? Iron deficiency anemia due to chronic blood loss 11/21/2020  ? Kidney stone   ? Palpitation   ? SVT (supraventricular tachycardia) (HCC)   ? SVT (supraventricular tachycardia) (HCC)   ? Tachycardia   ? ?Patient Active Problem List  ? Diagnosis Date Noted  ? Morbid obesity (HCC) 06/17/2021  ? Current smoker 06/17/2021  ? Hypertension 04/10/2021  ? Elevated BP without diagnosis of hypertension 03/14/2021  ? Menorrhagia with regular cycle 03/14/2021  ? Routine cervical smear 03/14/2021  ? Iron deficiency anemia due to chronic blood loss 11/21/2020  ? Acne 11/07/2020  ? Encounter for general adult medical examination with abnormal findings 10/07/2020  ? Wound, open, nose 10/07/2020  ? Screening due 10/07/2020  ? Hyperthyroidism 10/07/2020  ? IDA (iron deficiency anemia) 10/07/2020  ? Cervical high risk HPV (human papillomavirus) test positive 07/31/2016   ? Depression 10/02/2015  ? Breast lump 10/02/2015  ? ?Home Medication(s) ?Prior to Admission medications   ?Medication Sig Start Date End Date Taking? Authorizing Provider  ?nystatin (MYCOSTATIN/NYSTOP) powder Apply 1 application. topically 3 (three) times daily. 06/28/21   Meda Dudzinski, MD  ?acetaminophen (TYLENOL) 325 MG tablet Take 2 tablets (650 mg total) by mouth every 6 (six) hours as needed for moderate pain or mild pain. 05/13/21   Myna Hidalgozan, Jennifer, DO  ?hydrochlorothiazide (MICROZIDE) 12.5 MG capsule TAKE 1 CAPSULE(12.5 MG) BY MOUTH DAILY 05/19/21   Cyril MourningGriffin, Jennifer A, NP  ?ibuprofen (ADVIL) 600 MG tablet Take 1 tablet (600 mg total) by mouth every 6 (six) hours as needed. 05/13/21   Myna Hidalgozan, Jennifer, DO  ?levothyroxine (SYNTHROID) 150 MCG tablet Take 1 tablet (150 mcg total) by mouth daily. 05/30/21   Dani Gobbleeardon, Whitney J, NP  ?nitrofurantoin, macrocrystal-monohydrate, (MACROBID) 100 MG capsule Take 1 capsule (100 mg total) by mouth 2 (two) times daily. 06/27/21   Anabel HalonPatel, Rutwik K, MD  ?                                                                                                                                  ?  Past Surgical History ?Past Surgical History:  ?Procedure Laterality Date  ? ABDOMINAL HYSTERECTOMY N/A 05/13/2021  ? Procedure: HYSTERECTOMY ABDOMINAL;  Surgeon: Myna Hidalgo, DO;  Location: [facility];  Service: Gynecology;  Laterality: N/A;  ? CESAREAN SECTION    ? 06/11/09; 05/25/2003  ? CESAREAN SECTION WITH BILATERAL TUBAL LIGATION Bilateral 08/22/2014  ? Procedure: CESAREAN SECTION WITH BILATERAL TUBAL LIGATION;  Surgeon: Essie Hart, MD;  Location: [facility];  Service: Obstetrics;  Laterality: Bilateral;  ? DILATION AND CURETTAGE OF UTERUS  01/2008  ? ELBOW SURGERY    ? Left  ? ?Family History ?Family History  ?Problem Relation Age of Onset  ? Cancer Paternal Grandfather   ?     thyroid  ? Cancer Paternal Grandmother   ?     ovarian  ? Diabetes Maternal Grandmother   ? Hypertension Maternal Grandmother    ? Heart disease Maternal Grandmother   ? Hypertension Mother   ? Diabetes Mother   ? Diabetes Brother   ? ? ?Social History ?Social History  ? ?Tobacco Use  ? Smoking status: Every Day  ?  Packs/day: 0.25  ?  Years: 20.00  ?  Pack years: 5.00  ?  Types: Cigarettes  ? Smokeless tobacco: Never  ?Vaping Use  ? Vaping Use: Former  ?Substance Use Topics  ? Alcohol use: Yes  ?  Comment: maybe 1-2 per year  ? Drug use: No  ? ?Allergies ?Levaquin [levofloxacin in d5w], Levofloxacin, Pertussis vaccines, Megace [megestrol], Tetanus-diphth-acell pertussis, and Venofer [iron sucrose] ? ?Review of Systems ?Review of Systems  ?Gastrointestinal:  Positive for abdominal pain.  ?Genitourinary:  Positive for vaginal bleeding.  ? ?Physical Exam ?Vital Signs  ?I have reviewed the triage vital signs ?BP 123/79   Pulse 85   Temp 98.2 ?F (36.8 ?C) (Oral)   Resp 17   Ht 5\' 5"  (1.651 m)   Wt 115.2 kg   LMP 04/30/2021   SpO2 99%   BMI 42.27 kg/m?  ? ?Physical Exam ?Vitals and nursing note reviewed.  ?Constitutional:   ?   General: She is not in acute distress. ?   Appearance: She is well-developed.  ?HENT:  ?   Head: Normocephalic and atraumatic.  ?Eyes:  ?   Conjunctiva/sclera: Conjunctivae normal.  ?Cardiovascular:  ?   Rate and Rhythm: Normal rate and regular rhythm.  ?   Heart sounds: No murmur heard. ?Pulmonary:  ?   Effort: Pulmonary effort is normal. No respiratory distress.  ?   Breath sounds: Normal breath sounds.  ?Abdominal:  ?   Palpations: Abdomen is soft.  ?   Tenderness: There is no abdominal tenderness.  ?Musculoskeletal:     ?   General: No swelling.  ?   Cervical back: Neck supple.  ?Skin: ?   General: Skin is warm and dry.  ?   Capillary Refill: Capillary refill takes less than 2 seconds.  ?   Findings: Rash (Erythematous rash with satellite lesions at the site of the pannicular fold) present.  ?Neurological:  ?   Mental Status: She is alert.  ?Psychiatric:     ?   Mood and Affect: Mood normal.  ? ? ?ED Results  and Treatments ?Labs ?(all labs ordered are listed, but only abnormal results are displayed) ?Labs Reviewed  ?COMPREHENSIVE METABOLIC PANEL - Abnormal; Notable for the following components:  ?    Result Value  ? Glucose, Bld 112 (*)   ? All other components within normal limits  ?CBC WITH  DIFFERENTIAL/PLATELET - Abnormal; Notable for the following components:  ? RBC 5.20 (*)   ? All other components within normal limits  ?WET PREP, GENITAL  ?URINALYSIS, ROUTINE W REFLEX MICROSCOPIC  ?GC/CHLAMYDIA PROBE AMP ([place]) NOT AT [facility]  ?                                                                                                                       ? ?Radiology ?CT ABDOMEN PELVIS W CONTRAST ? ?Result Date: 06/28/2021 ?CLINICAL DATA:  Right lower quadrant pain, history of hysterectomy several weeks ago. EXAM: CT ABDOMEN AND PELVIS WITH CONTRAST TECHNIQUE: Multidetector CT imaging of the abdomen and pelvis was performed using the standard protocol following bolus administration of intravenous contrast. RADIATION DOSE REDUCTION: This exam was performed according to the departmental dose-optimization program which includes automated exposure control, adjustment of the mA and/or kV according to patient size and/or use of iterative reconstruction technique. CONTRAST:  OMNIPAQUE IOHEXOL 300 MG/ML  SOLN COMPARISON:  None. FINDINGS: Lower chest: No acute abnormality. Hepatobiliary: Mild fatty infiltration of the liver is noted. The gallbladder is within normal limits. Pancreas: Unremarkable. No pancreatic ductal dilatation or surrounding inflammatory changes. Spleen: Normal in size without focal abnormality. Adrenals/Urinary Tract: Adrenal glands are unremarkable. Kidneys demonstrate a normal enhancement pattern bilaterally. Bilateral nonobstructing renal calculi are seen.Largest of these in the right kidney measures up to 7 mm in the lower pole. Largest on the left measures up to 4 mm. Ureters are within normal  limits. Bladder is well distended. Stomach/Bowel: Colon shows no obstructive or inflammatory changes. The appendix is within normal limits. Small bowel and stomach are unremarkable. Vascular/Lymphatic: No significan

## 2021-06-29 LAB — URINE CULTURE

## 2021-06-30 ENCOUNTER — Encounter: Payer: Self-pay | Admitting: Obstetrics & Gynecology

## 2021-07-01 LAB — GC/CHLAMYDIA PROBE AMP (~~LOC~~) NOT AT ARMC
Chlamydia: NEGATIVE
Comment: NEGATIVE
Comment: NORMAL
Neisseria Gonorrhea: NEGATIVE

## 2021-07-02 ENCOUNTER — Ambulatory Visit (INDEPENDENT_AMBULATORY_CARE_PROVIDER_SITE_OTHER): Payer: Commercial Managed Care - PPO | Admitting: Obstetrics & Gynecology

## 2021-07-02 ENCOUNTER — Encounter: Payer: Self-pay | Admitting: *Deleted

## 2021-07-02 ENCOUNTER — Encounter: Payer: Self-pay | Admitting: Obstetrics & Gynecology

## 2021-07-02 ENCOUNTER — Other Ambulatory Visit (HOSPITAL_COMMUNITY)
Admission: RE | Admit: 2021-07-02 | Discharge: 2021-07-02 | Disposition: A | Discharge status: Home / Self Care | Payer: Commercial Managed Care - PPO | Source: Ambulatory Visit | Attending: Obstetrics & Gynecology | Admitting: Obstetrics & Gynecology

## 2021-07-02 VITALS — BP 136/86 | HR 85 | Ht 65.0 in | Wt 254.4 lb

## 2021-07-02 DIAGNOSIS — R102 Pelvic and perineal pain: Secondary | ICD-10-CM

## 2021-07-02 DIAGNOSIS — N898 Other specified noninflammatory disorders of vagina: Secondary | ICD-10-CM | POA: Insufficient documentation

## 2021-07-02 DIAGNOSIS — Z90710 Acquired absence of both cervix and uterus: Secondary | ICD-10-CM

## 2021-07-02 NOTE — Progress Notes (Signed)
? ?  GYN VISIT ?Patient name: Paula Myers MRN [id number]  Date of birth: 1985-09-05 ?Chief Complaint:   ?Pelvic Pain ? ?History of Present Illness:   ?Paula Myers is a 36 y.o. [phone] PM female being seen today for pelvic pain ? ?To review, s/p TAH/BS completed 05/13/21.  Initially postop she was feeling great and had returned to regular activity.  She notes that she was feeling great- working out, walking and doing chores. ? ?Then, about 7-10 days ago started to note abdominal pain and pressure with urination or BM.  Seen by PCP, UA with trace blood, started on Macrobid. Saturday started to note spotting- seen in ER.   ?ER note reviewed- CT completed- 4x4cm seroma consistent with recent surgery.  No evidence of abscess.  No other acute abnormalities.  Renal calculi noted- previously present. ? ?Cramping still noted- improved with ibuprofen.  No nausea/vomiting.  Having regular BMs.  Just notes diffuse abdominal tenderness as her main concern.  Also noted an orange-tinted discharge.  No odor or itching.  Notes discomfort with IC. ? ?Patient's last menstrual period was 04/30/2021. ? ? ?  06/27/2021  ?  8:41 AM 06/17/2021  ? 10:29 AM 03/14/2021  ? 10:23 AM 11/21/2020  ?  9:12 AM 11/07/2020  ? 10:10 AM  ?Depression screen PHQ 2/9  ?Decreased Interest 0 0 0 0 0  ?Down, Depressed, Hopeless 0 0 1 0 0  ?PHQ - 2 Score 0 0 1 0 0  ?Altered sleeping   1    ?Tired, decreased energy   3    ?Change in appetite   2    ?Feeling bad or failure about yourself    0    ?Trouble concentrating   1    ?Moving slowly or fidgety/restless   0    ?Suicidal thoughts   0    ?PHQ-9 Score   8    ? ? ? ?Review of Systems:   ?Pertinent items are noted in HPI ?Denies fever/chills, dizziness, headaches, visual disturbances, fatigue, shortness of breath, chest pain. ?Pertinent History Reviewed:  ?Reviewed past medical,surgical, social, obstetrical and family history.  ?Reviewed problem list, medications and allergies. ?Physical Assessment:   ? ?Vitals:  ? 07/02/21 0931  ?BP: 136/86  ?Pulse: 85  ?Weight: 254 lb 6.4 oz (115.4 kg)  ?Height: 5\' 5"  (1.651 m)  ?Body mass index is 42.33 kg/m?. ? ?     Physical Examination:  ? General appearance: alert, well appearing, and in no distress ? Psych: mood appropriate, normal affect ? Skin: warm & dry  ? Cardiovascular: normal heart rate noted ? Respiratory: normal respiratory effort, no distress ? Abdomen: soft, non-tender, no rebound, no guarding ? Incision: well healed- C/D/I, minimal erythema ? Pelvic: VULVA: normal appearing vulva with no masses, tenderness or lesions, VAGINA: normal appearing vagina with normal color, minimal tan frothy discharge noted.  Uterus and cervix surgically absent.  Vaginal cuff intact- no tenderness, no masses appreciated ? Extremities: no edema , no calf tenderness bilaterally ? ?Chaperone:   ? ?Assessment & Plan:  ?1) Pelvic pain ?-no acute abnormalities appreciated ?-reviewed surgical recovery and advised decreased activity over the next few weeks ?-ok for Ibuprofen/tylenol ? ?2) Vaginal discharge ?-plan to r/o underlying infection ?-vaginitis ? ? ?Return if symptoms worsen or fail to improve. ? ? ?Faith Rogue, DO ?Attending Obstetrician & Gynecologist, Faculty Practice ?[facility] for [facility], [facility] ? ? ? ?

## 2021-07-04 ENCOUNTER — Other Ambulatory Visit: Payer: Self-pay | Admitting: Obstetrics & Gynecology

## 2021-07-04 DIAGNOSIS — B9689 Other specified bacterial agents as the cause of diseases classified elsewhere: Secondary | ICD-10-CM

## 2021-07-04 LAB — CERVICOVAGINAL ANCILLARY ONLY
Bacterial Vaginitis (gardnerella): POSITIVE — AB
Candida Glabrata: NEGATIVE
Candida Vaginitis: NEGATIVE
Comment: NEGATIVE
Comment: NEGATIVE
Comment: NEGATIVE

## 2021-07-04 MED ORDER — METRONIDAZOLE 0.75 % VA GEL: 1.0000 | Freq: Every day | VAGINAL | 0 refills | Status: AC

## 2021-07-04 NOTE — Progress Notes (Signed)
Rx for BV 

## 2021-07-08 ENCOUNTER — Encounter: Payer: Self-pay | Admitting: Obstetrics & Gynecology

## 2021-07-10 ENCOUNTER — Encounter (HOSPITAL_COMMUNITY): Payer: Self-pay | Admitting: Hematology

## 2021-07-11 ENCOUNTER — Inpatient Hospital Stay (HOSPITAL_COMMUNITY): Payer: Commercial Managed Care - PPO | Attending: Hematology

## 2021-07-11 DIAGNOSIS — N920 Excessive and frequent menstruation with regular cycle: Secondary | ICD-10-CM | POA: Insufficient documentation

## 2021-07-11 DIAGNOSIS — D500 Iron deficiency anemia secondary to blood loss (chronic): Secondary | ICD-10-CM | POA: Insufficient documentation

## 2021-07-17 ENCOUNTER — Inpatient Hospital Stay (HOSPITAL_COMMUNITY): Payer: Commercial Managed Care - PPO | Attending: Hematology

## 2021-07-17 DIAGNOSIS — E611 Iron deficiency: Secondary | ICD-10-CM | POA: Insufficient documentation

## 2021-07-17 DIAGNOSIS — D500 Iron deficiency anemia secondary to blood loss (chronic): Secondary | ICD-10-CM

## 2021-07-17 LAB — CBC WITH DIFFERENTIAL/PLATELET
Abs Immature Granulocytes: 0.04 10*3/uL (ref 0.00–0.07)
Basophils Absolute: 0.1 10*3/uL (ref 0.0–0.1)
Basophils Relative: 1 %
Eosinophils Absolute: 0.1 10*3/uL (ref 0.0–0.5)
Eosinophils Relative: 1 %
HCT: 44.1 % (ref 36.0–46.0)
Hemoglobin: 14.8 g/dL (ref 12.0–15.0)
Immature Granulocytes: 0 %
Lymphocytes Relative: 25 %
Lymphs Abs: 2.9 10*3/uL (ref 0.7–4.0)
MCH: 29.1 pg (ref 26.0–34.0)
MCHC: 33.6 g/dL (ref 30.0–36.0)
MCV: 86.8 fL (ref 80.0–100.0)
Monocytes Absolute: 0.7 10*3/uL (ref 0.1–1.0)
Monocytes Relative: 6 %
Neutro Abs: 7.8 10*3/uL — ABNORMAL HIGH (ref 1.7–7.7)
Neutrophils Relative %: 67 %
Platelets: 339 10*3/uL (ref 150–400)
RBC: 5.08 MIL/uL (ref 3.87–5.11)
RDW: 13 % (ref 11.5–15.5)
WBC: 11.6 10*3/uL — ABNORMAL HIGH (ref 4.0–10.5)
nRBC: 0 % (ref 0.0–0.2)

## 2021-07-17 LAB — IRON AND TIBC
Iron: 47 ug/dL (ref 28–170)
Saturation Ratios: 13 % (ref 10.4–31.8)
TIBC: 355 ug/dL (ref 250–450)
UIBC: 308 ug/dL

## 2021-07-17 LAB — FERRITIN: Ferritin: 44 ng/mL (ref 11–307)

## 2021-07-17 NOTE — Progress Notes (Signed)
Virtual Visit via Telephone Note [facility]  I connected with Paula Myers  on 07/18/21 at  2:43 PM by telephone and verified that I am speaking with the correct person using two identifiers.  Location: Patient: Home Provider: [facility]   I discussed the limitations, risks, security and privacy concerns of performing an evaluation and management service by telephone and the availability of in person appointments. I also discussed with the patient that there may be a patient responsible charge related to this service. The patient expressed understanding and agreed to proceed.  REASON FOR VISIT: Iron deficiency anemia  CURRENT THERAPY: Intermittent IV iron infusions  INTERVAL HISTORY: [name] is contacted today for follow-up of her iron deficiency anemia.  She was last evaluated via telemedicine visit by Rojelio Brenner PA-C on 05/06/2021. Since her last visit, she had total abdominal hysterectomy completed on 05/13/2021 and is no longer having periods.  She denies any bright red blood per rectum or melena.  She does report that she is more fatigued than usual, with energy about 50%.  She has occasional headaches.  She denies any pica, restless legs, chest pain, dyspnea on exertion, lightheadedness, or syncope.   OBSERVATIONS/OBJECTIVE: Review of Systems  Constitutional:  Positive for malaise/fatigue. Negative for chills, diaphoresis, fever and weight loss.  Respiratory:  Negative for cough and shortness of breath.   Cardiovascular:  Negative for chest pain and palpitations.  Gastrointestinal:  Negative for abdominal pain, blood in stool, melena, nausea and vomiting.  Neurological:  Positive for headaches. Negative for dizziness.    PHYSICAL EXAM (per limitations of virtual telephone visit): The patient is alert and oriented x 3, exhibiting adequate mentation, good mood, and ability to speak in full sentences and execute sound  judgement.   ASSESSMENT & PLAN: 1.  Symptomatic iron deficiency without anemia, secondary to chronic blood loss from menorrhagia - She has history of very heavy menstrual cycles, but underwent hysterectomy on 05/13/2021 - No gross GI hemorrhage - denies bright red blood per rectum and melena     - Takes daily ferrous sulfate, but without improvement in her iron levels - Most recent IV iron in January 2023.  Received Venofer on 03/10/2021 but experienced significant chest pain during infusion despite premedications with IV steroids.  Subsequent infusions were switched to [facility] along with continued steroid premedication.  She received Feraheme x2 on 03/17/2021 and 03/24/2021, which she tolerated well. - Most recent labs (07/17/2021): Hgb 14.8/MCV 86.8, ferritin 44, iron saturation 13%. - PLAN: Recommend IV Feraheme x2 (with steroid PREMEDICATION) due to worsening fatigue and ferritin <100 despite oral iron. - Continue daily iron supplementation.  - Repeat labs and RTC in 6 months - if she is doing well at that time, we may be able to discharge from clinic since the source of her blood loss has been treated (menorrhagia resolved s/p hysterectomy) ** NOTE: For all IV iron given in the future, we will premedicate with steroids due to history of anaphylactic reaction to Levaquin and history of adverse reaction to Venofer.  2.  Other history - PMH: Hypothyroidism/Graves' disease with radioablation of thyroid on 11/15/2020; hypertension and SVT during pregnancy - SOCIAL: Lives with her husband and 3 children.  Works night shift as Industrial/product designer. - SUBSTANCE: Smoked intermittently since age 34, currently smokes less than 0.5 packs/day.  Rare social alcohol consumption.  No history of illicit drug use. - FAMILY: Paternal grandmother with ovarian cancer.  Paternal grandfather  with thyroid cancer.  Maternal grandmother with unspecified anemia.   FOLLOW UP INSTRUCTIONS: Feraheme x2 Same-day labs and office  visit in 6 months    I discussed the assessment and treatment plan with the patient. The patient was provided an opportunity to ask questions and all were answered. The patient agreed with the plan and demonstrated an understanding of the instructions.   The patient was advised to call back or seek an in-person evaluation if the symptoms worsen or if the condition fails to improve as anticipated.  I provided 17 minutes of non-face-to-face time during this encounter.   Carnella Guadalajara, PA-C 07/18/2021 4:56 PM

## 2021-07-18 ENCOUNTER — Inpatient Hospital Stay (HOSPITAL_BASED_OUTPATIENT_CLINIC_OR_DEPARTMENT_OTHER): Payer: Commercial Managed Care - PPO | Admitting: Physician Assistant

## 2021-07-18 ENCOUNTER — Encounter: Payer: Self-pay | Admitting: Obstetrics & Gynecology

## 2021-07-18 DIAGNOSIS — D500 Iron deficiency anemia secondary to blood loss (chronic): Secondary | ICD-10-CM

## 2021-07-23 ENCOUNTER — Encounter: Payer: Self-pay | Admitting: Nurse Practitioner

## 2021-07-23 ENCOUNTER — Encounter: Payer: Self-pay | Admitting: Nutrition

## 2021-07-23 ENCOUNTER — Encounter: Payer: Commercial Managed Care - PPO | Attending: Nurse Practitioner | Admitting: Nutrition

## 2021-07-23 DIAGNOSIS — E782 Mixed hyperlipidemia: Secondary | ICD-10-CM | POA: Insufficient documentation

## 2021-07-23 NOTE — Progress Notes (Signed)
Medical Nutrition Therapy  Appointment Start time:  0800  Appointment End time:  0900  Primary concerns today: Obesity,   Referral diagnosis: E66.9 Preferred learning style: NO preference  Learning readiness: Ready    NUTRITION ASSESSMENT  36 yr old female with thyroid issues and obesity.  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 Her schedule is very hectic. Struggles with meal planning.  Schedule alternates every 2 weeks.,   Wants to lose weight and prevent developing diabetes. Doesn't have much energy. Always tired. Has to handle her kids when she is at home and little time to sleep. Eats 1-2 meals per day. Admits to eating junk food at night to stay awake. No exercise due to lack of time.  Current diet is inconsistent to meet her needs and excessive in calories as evidence by weight gain and BMI > 40.   Anthropometrics  Wt Readings from Last 3 Encounters:  07/23/21 254 lb 6.4 oz (115.4 kg)  07/02/21 254 lb 6.4 oz (115.4 kg)  06/28/21 254 lb (115.2 kg)   Ht Readings from Last 3 Encounters:  07/23/21 5\' 6"  (1.676 m)  07/02/21 5\' 5"  (1.651 m)  06/28/21 5\' 5"  (1.651 m)   Body mass index is 41.06 kg/m. @BMIFA @ Facility age limit for growth percentiles is 20 years. Facility age limit for growth percentiles is 20 years.    Clinical Medical Hx: See chart Medications: see chart  Labs: No results found for: HGBA1C    Latest Ref Rng & Units 06/28/2021    7:34 PM 05/09/2021    4:04 PM 11/05/2020    9:07 AM  CMP  Glucose 70 - 99 mg/dL   97   81    BUN 6 - 20 mg/dL 9   9   7     Creatinine 0.44 - 1.00 mg/dL 06/30/2021   07/09/2021   01/05/2021    Sodium 135 - 145 mmol/L 137   139   138    Potassium 3.5 - 5.1 mmol/L 4.1   4.3   4.2    Chloride 98 - 111 mmol/L 107   105   103    CO2 22 - 32 mmol/L 24   25   20     Calcium 8.9 - 10.3 mg/dL 8.9   8.9   9.5    Total Protein 6.5 - 8.1 g/dL 8.0    7.0    Total Bilirubin 0.3 - 1.2 mg/dL 0.6     0.2    Alkaline Phos 38 - 126 U/L 68    101    AST 15 - 41 U/L 22    17    ALT 0 - 44 U/L 33    22     Lipid Panel     Component Value Date/Time   CHOL 178 11/05/2020 0907   TRIG 101 11/05/2020 0907   HDL 46 11/05/2020 0907   LDLCALC 114 (H) 11/05/2020 0907   LABVLDL 18 11/05/2020 0907    Notable Signs/Symptoms: fatigue  Lifestyle & Dietary Hx Lives with her husband and 3 kids.Works 3rd shift as a . Works 12 hr shifts..  Works 6 to 6.   Estimated daily fluid intake: 32 oz Supplements:  Sleep: poor Stress / self-care: work balance and family life Current average weekly physical activity: ADL  24-Hr Dietary Recall Eats 1-2 meals per day. Snacks often. Grazes. Drinks diet sodas and liquids with  caffeine.  Estimated Energy Needs Calories: 1200 Carbohydrate: 135g Protein: 90g Fat: 33g   NUTRITION DIAGNOSIS  NI-1.7 Predicted excessive energy intake As related to Diet high in processed foods.  As evidenced by BMI > 40.   NUTRITION INTERVENTION  Nutrition education (E-1) on the following topics:  Lifestyle Medicine  - Whole Food, Plant Predominant Nutrition is highly recommended: Eat Plenty of vegetables, Mushrooms, fruits, Legumes, Whole Grains, Nuts, seeds in lieu of processed meats, processed snacks/pastries red meat, poultry, eggs.    -It is better to avoid simple carbohydrates including: Cakes, Sweet Desserts, Ice Cream, Soda (diet and regular), Sweet Tea, Candies, Chips, Cookies, Store Bought Juices, Alcohol in Excess of  1-2 drinks a day, Lemonade,  Artificial Sweeteners, Doughnuts, Coffee Creamers, "Sugar-free" Products, etc, etc.  This is not a complete list.....  Exercise: If you are able: 30 -60 minutes a day ,4 days a week, or 150 minutes a week.  The longer the better.  Combine stretch, strength, and aerobic activities.  If you were told in the past that you have high risk for cardiovascular diseases, you may seek evaluation by your heart doctor prior  to initiating moderate to intense exercise programs.   Handouts Provided Include  Lifestyle medicine Meal Planning [email or web address]   Learning Style & Readiness for Change Teaching method utilized: Visual & Auditory  Demonstrated degree of understanding via: Teach Back  Barriers to learning/adherence to lifestyle change: none  Goals Established by Pt Goals   Eat three meals per day Focus on more plant based foods Walk 3 times per week. Watch utube meal planning and meal prepping. Cut out fast foods and processed. Lose 1 lb per week   MONITORING & EVALUATION Dietary intake, weekly physical activity, and weight  in 1 month.  Next Steps  Patient is to work on better meal planning and eating meals on time during work shift.Marland Kitchen

## 2021-07-23 NOTE — Patient Instructions (Signed)
Goals   Eat three meals per day Focus on more plant based foods Walk 3 times per week. Watch utube meal planning and meal prepping. Cut out fast foods and processed. Lose 1 lb per week

## 2021-07-24 ENCOUNTER — Other Ambulatory Visit: Payer: Self-pay | Admitting: Nurse Practitioner

## 2021-07-24 NOTE — Telephone Encounter (Signed)
Please advise pt last seen 4/18

## 2021-07-24 NOTE — Telephone Encounter (Signed)
Spoke with pt advised of Fola's message, she verbalized understanding and would like to try the phentermine. Please avise

## 2021-07-24 NOTE — Progress Notes (Unsigned)
ENTER

## 2021-07-25 ENCOUNTER — Ambulatory Visit (INDEPENDENT_AMBULATORY_CARE_PROVIDER_SITE_OTHER): Payer: Commercial Managed Care - PPO | Admitting: Nurse Practitioner

## 2021-07-25 ENCOUNTER — Inpatient Hospital Stay (HOSPITAL_COMMUNITY): Payer: Commercial Managed Care - PPO

## 2021-07-25 ENCOUNTER — Encounter: Payer: Self-pay | Admitting: Nurse Practitioner

## 2021-07-25 VITALS — BP 122/86 | HR 75 | Temp 97.6°F | Resp 18

## 2021-07-25 DIAGNOSIS — N920 Excessive and frequent menstruation with regular cycle: Secondary | ICD-10-CM | POA: Diagnosis present

## 2021-07-25 DIAGNOSIS — D500 Iron deficiency anemia secondary to blood loss (chronic): Secondary | ICD-10-CM

## 2021-07-25 MED ORDER — PHENTERMINE HCL 15 MG PO CAPS
15.0000 mg | ORAL_CAPSULE | ORAL | 1 refills | Status: DC
Start: 2021-07-25 — End: 2021-08-26

## 2021-07-25 MED ORDER — ACETAMINOPHEN 325 MG PO TABS
650.0000 mg | ORAL_TABLET | Freq: Once | ORAL | Status: AC
  Administered 2021-07-25: 650 mg via ORAL
  Filled 2021-07-25: qty 2

## 2021-07-25 MED ORDER — METHYLPREDNISOLONE SODIUM SUCC 125 MG IJ SOLR
125.0000 mg | Freq: Once | INTRAMUSCULAR | Status: AC
  Administered 2021-07-25: 125 mg via INTRAVENOUS
  Filled 2021-07-25: qty 2

## 2021-07-25 MED ORDER — LORATADINE 10 MG PO TABS
10.0000 mg | ORAL_TABLET | Freq: Once | ORAL | Status: AC
  Administered 2021-07-25: 10 mg via ORAL
  Filled 2021-07-25: qty 1

## 2021-07-25 MED ORDER — FAMOTIDINE 20 MG PO TABS
20.0000 mg | ORAL_TABLET | Freq: Once | ORAL | Status: AC
  Administered 2021-07-25: 20 mg via ORAL
  Filled 2021-07-25: qty 1

## 2021-07-25 MED ORDER — SODIUM CHLORIDE 0.9 % IV SOLN: Freq: Once | INTRAVENOUS | Status: AC

## 2021-07-25 MED ORDER — SODIUM CHLORIDE 0.9 % IV SOLN
510.0000 mg | Freq: Once | INTRAVENOUS | Status: AC
  Administered 2021-07-25: 510 mg via INTRAVENOUS
  Filled 2021-07-25: qty 17

## 2021-07-25 NOTE — Patient Instructions (Signed)
[facility]  Discharge Instructions: °Thank you for choosing [facility] to provide your oncology and hematology care.  °If you have a lab appointment with the Cancer Center, please come in thru the Main Entrance and check in at the main information desk. ° °Wear comfortable clothing and clothing appropriate for easy access to any Portacath or PICC line.  ° °We strive to give you quality time with your provider. You may need to reschedule your appointment if you arrive late (15 or more minutes).  Arriving late affects you and other patients whose appointments are after yours.  Also, if you miss three or more appointments without notifying the office, you may be dismissed from the clinic at the provider’s discretion.    °  °For prescription refill requests, have your pharmacy contact our office and allow 72 hours for refills to be completed.   ° °Today you received the following chemotherapy and/or immunotherapy agents Feraheme    °  °To help prevent nausea and vomiting after your treatment, we encourage you to take your nausea medication as directed. ° °BELOW ARE SYMPTOMS THAT SHOULD BE REPORTED IMMEDIATELY: °*FEVER GREATER THAN 100.4 F (38 °C) OR HIGHER °*CHILLS OR SWEATING °*NAUSEA AND VOMITING THAT IS NOT CONTROLLED WITH YOUR NAUSEA MEDICATION °*UNUSUAL SHORTNESS OF BREATH °*UNUSUAL BRUISING OR BLEEDING °*URINARY PROBLEMS (pain or burning when urinating, or frequent urination) °*BOWEL PROBLEMS (unusual diarrhea, constipation, pain near the anus) °TENDERNESS IN MOUTH AND THROAT WITH OR WITHOUT PRESENCE OF ULCERS (sore throat, sores in mouth, or a toothache) °UNUSUAL RASH, SWELLING OR PAIN  °UNUSUAL VAGINAL DISCHARGE OR ITCHING  ° °Items with * indicate a potential emergency and should be followed up as soon as possible or go to the Emergency Department if any problems should occur. ° °Please show the CHEMOTHERAPY ALERT CARD or IMMUNOTHERAPY ALERT CARD at check-in to the Emergency  Department and triage nurse. ° °Should you have questions after your visit or need to cancel or reschedule your appointment, please contact [facility] [phone]  and follow the prompts.  Office hours are 8:00 a.m. to 4:30 p.m. Monday - Friday. Please note that voicemails left after 4:00 p.m. may not be returned until the following business day.  We are closed weekends and major holidays. You have access to a nurse at all times for urgent questions. Please call the main number to the clinic [phone] and follow the prompts. ° °For any non-urgent questions, you may also contact your provider using MyChart. We now offer e-Visits for anyone 18 and older to request care online for non-urgent symptoms. For details visit [email or web address]. °  °Also download the MyChart app! Go to the app store, search "MyChart", open the app, select [facility], and log in with your MyChart username and password. ° °Due to Covid, a mask is required upon entering the hospital/clinic. If you do not have a mask, one will be given to you upon arrival. For doctor visits, patients may have 1 support person aged 18 or older with them. For treatment visits, patients cannot have anyone with them due to current Covid guidelines and our immunocompromised population.  °

## 2021-07-25 NOTE — Telephone Encounter (Signed)
Spoke with pt put on schedule for today

## 2021-07-25 NOTE — Assessment & Plan Note (Addendum)
Rx phentermine 15 mg  once daily before breakfast or 1 to 2 hours after breakfast   Side effects of medication including hypertension, palpitation discussed with patient Need to increase intake of whole foods consisting mainly vegetables and protein less carbohydrate drinking at least 64 ounces of water daily, engaging in regular moderate exercises at least 150 minutes weekly once cleared by OBGYN encouraged , importance of portion control also discussed.  Follow-up in 4 weeks

## 2021-07-25 NOTE — Progress Notes (Signed)
Patient presents today for Feraheme infusion per providers order.  Vital signs WNL.  Patient has no new complaints at this time.    Peripheral IV started and blood return noted pre and post infusion.  Feraheme infusion  given today per MD orders.  Stable during infusion without adverse affects.  Vital signs stable.  No complaints at this time.  Discharge from clinic ambulatory in stable condition.  Alert and oriented X 3.  Follow up with [facility] as scheduled.  

## 2021-07-25 NOTE — Progress Notes (Signed)
Virtual Visit via Telephone Note  I connected with Paula Myers @ on 07/25/21 at 0950am by telephone and verified that I am speaking with the correct person using two identifiers.  I spent 8 minutes on this telephone encounter  Location: Patient: home Provider: office   I discussed the limitations, risks, security and privacy concerns of performing an evaluation and management service by telephone and the availability of in person appointments. I also discussed with the patient that there may be a patient responsible charge related to this service. The patient expressed understanding and agreed to proceed.   History of Present Illness:  Paula Myers with past medical history of morbid obesity, hypertension, post ablative hypothyroidism presents to discuss weight loss medications .  Patient stated that she has changed her diet , cut out bread , sweets , coffee. Currently seeing a nutritionist.she has started walking a couple of times weekly.  due to having hysterectomy 2 months ago she has not been doing vigorous exercises.  currently not on BP meds, home readings has been 110/74.  Patient would like to proceed with phentermine for weight loss management    Observations/Objective:   Assessment and Plan: Morbid obesity (HCC) Rx phentermine 15 mg  once daily before breakfast or 1 to 2 hours after breakfast   Side effects of medication including hypertension, palpitation discussed with patient Need to increase intake of whole foods consisting mainly vegetables and protein less carbohydrate drinking at least 64 ounces of water daily, engaging in regular moderate exercises at least 150 minutes weekly once cleared by OBGYN encouraged , importance of portion control also discussed.  Follow-up in 4 weeks   Follow Up Instructions:    I discussed the assessment and treatment plan with the patient. The patient was provided an opportunity to ask questions and all were answered. The patient  agreed with the plan and demonstrated an understanding of the instructions.   The patient was advised to call back or seek an in-person evaluation if the symptoms worsen or if the condition fails to improve as anticipated.

## 2021-07-29 ENCOUNTER — Encounter: Payer: Self-pay | Admitting: Nutrition

## 2021-07-30 ENCOUNTER — Other Ambulatory Visit: Payer: Self-pay | Admitting: Nurse Practitioner

## 2021-07-30 DIAGNOSIS — E890 Postprocedural hypothyroidism: Secondary | ICD-10-CM

## 2021-08-01 ENCOUNTER — Inpatient Hospital Stay (HOSPITAL_COMMUNITY): Payer: Commercial Managed Care - PPO | Attending: Hematology

## 2021-08-01 ENCOUNTER — Encounter (HOSPITAL_COMMUNITY): Payer: Self-pay

## 2021-08-01 VITALS — BP 96/72 | HR 84 | Temp 98.0°F | Resp 18

## 2021-08-01 DIAGNOSIS — N920 Excessive and frequent menstruation with regular cycle: Secondary | ICD-10-CM | POA: Insufficient documentation

## 2021-08-01 DIAGNOSIS — D500 Iron deficiency anemia secondary to blood loss (chronic): Secondary | ICD-10-CM | POA: Insufficient documentation

## 2021-08-01 MED ORDER — FAMOTIDINE 20 MG PO TABS
20.0000 mg | ORAL_TABLET | Freq: Once | ORAL | Status: AC
  Administered 2021-08-01: 20 mg via ORAL
  Filled 2021-08-01: qty 1

## 2021-08-01 MED ORDER — ACETAMINOPHEN 325 MG PO TABS
650.0000 mg | ORAL_TABLET | Freq: Once | ORAL | Status: AC
  Administered 2021-08-01: 650 mg via ORAL
  Filled 2021-08-01: qty 2

## 2021-08-01 MED ORDER — LORATADINE 10 MG PO TABS
10.0000 mg | ORAL_TABLET | Freq: Once | ORAL | Status: AC
  Administered 2021-08-01: 10 mg via ORAL
  Filled 2021-08-01: qty 1

## 2021-08-01 MED ORDER — SODIUM CHLORIDE 0.9 % IV SOLN: Freq: Once | INTRAVENOUS | Status: AC

## 2021-08-01 MED ORDER — METHYLPREDNISOLONE SODIUM SUCC 125 MG IJ SOLR
125.0000 mg | Freq: Once | INTRAMUSCULAR | Status: AC
  Administered 2021-08-01: 125 mg via INTRAVENOUS
  Filled 2021-08-01: qty 2

## 2021-08-01 MED ORDER — SODIUM CHLORIDE 0.9 % IV SOLN
510.0000 mg | Freq: Once | INTRAVENOUS | Status: AC
  Administered 2021-08-01: 510 mg via INTRAVENOUS
  Filled 2021-08-01: qty 510

## 2021-08-01 NOTE — Progress Notes (Signed)
Patient tolerated iron infusion with no complaints voiced.  Peripheral IV site clean and dry with good blood return noted before and after infusion.  Band aid applied.  VSS with discharge and left in satisfactory condition with no s/s of distress noted.   

## 2021-08-01 NOTE — Patient Instructions (Signed)
[facility]  Discharge Instructions: Thank you for choosing [facility] to provide your oncology and hematology care.  If you have a lab appointment with the Cancer Center, please come in thru the Main Entrance and check in at the main information desk.  Wear comfortable clothing and clothing appropriate for easy access to any Portacath or PICC line.   We strive to give you quality time with your provider. You may need to reschedule your appointment if you arrive late (15 or more minutes).  Arriving late affects you and other patients whose appointments are after yours.  Also, if you miss three or more appointments without notifying the office, you may be dismissed from the clinic at the provider's discretion.      For prescription refill requests, have your pharmacy contact our office and allow 72 hours for refills to be completed.    Today you received feraheme.  Ferumoxytol Injection What is this medication? FERUMOXYTOL (FER ue MOX i tol) treats low levels of iron in your body (iron deficiency anemia). Iron is a mineral that plays an important role in making red blood cells, which carry oxygen from your lungs to the rest of your body. This medicine may be used for other purposes; ask your health care provider or pharmacist if you have questions. COMMON BRAND NAME(S): Feraheme What should I tell my care team before I take this medication? They need to know if you have any of these conditions: Anemia not caused by low iron levels High levels of iron in the blood Magnetic resonance imaging (MRI) test scheduled An unusual or allergic reaction to iron, other medications, foods, dyes, or preservatives Pregnant or trying to get pregnant Breast-feeding How should I use this medication? This medication is for injection into a vein. It is given in a hospital or clinic setting. Talk to your care team the use of this medication in children. Special care may be  needed. Overdosage: If you think you have taken too much of this medicine contact a poison control center or emergency room at once. NOTE: This medicine is only for you. Do not share this medicine with others. What if I miss a dose? It is important not to miss your dose. Call your care team if you are unable to keep an appointment. What may interact with this medication? Other iron products This list may not describe all possible interactions. Give your health care provider a list of all the medicines, herbs, non-prescription drugs, or dietary supplements you use. Also tell them if you smoke, drink alcohol, or use illegal drugs. Some items may interact with your medicine. What should I watch for while using this medication? Visit your care team regularly. Tell your care team if your symptoms do not start to get better or if they get worse. You may need blood work done while you are taking this medication. You may need to follow a special diet. Talk to your care team. Foods that contain iron include: whole grains/cereals, dried fruits, beans, or peas, leafy green vegetables, and organ meats (liver, kidney). What side effects may I notice from receiving this medication? Side effects that you should report to your care team as soon as possible: Allergic reactions--skin rash, itching, hives, swelling of the face, lips, tongue, or throat Low blood pressure--dizziness, feeling faint or lightheaded, blurry vision Shortness of breath Side effects that usually do not require medical attention (report to your care team if they continue or are bothersome): Flushing  Headache Joint pain Muscle pain Nausea Pain, redness, or irritation at injection site This list may not describe all possible side effects. Call your doctor for medical advice about side effects. You may report side effects to FDA at 1-800-FDA-1088. Where should I keep my medication? This medication is given in a hospital or clinic and will  not be stored at home. NOTE: This sheet is a summary. It may not cover all possible information. If you have questions about this medicine, talk to your doctor, pharmacist, or health care provider.  2023 Elsevier/Gold Standard (2020-07-12 00:00:00)     To help prevent nausea and vomiting after your treatment, we encourage you to take your nausea medication as directed.  BELOW ARE SYMPTOMS THAT SHOULD BE REPORTED IMMEDIATELY: *FEVER GREATER THAN 100.4 F (38 C) OR HIGHER *CHILLS OR SWEATING *NAUSEA AND VOMITING THAT IS NOT CONTROLLED WITH YOUR NAUSEA MEDICATION *UNUSUAL SHORTNESS OF BREATH *UNUSUAL BRUISING OR BLEEDING *URINARY PROBLEMS (pain or burning when urinating, or frequent urination) *BOWEL PROBLEMS (unusual diarrhea, constipation, pain near the anus) TENDERNESS IN MOUTH AND THROAT WITH OR WITHOUT PRESENCE OF ULCERS (sore throat, sores in mouth, or a toothache) UNUSUAL RASH, SWELLING OR PAIN  UNUSUAL VAGINAL DISCHARGE OR ITCHING   Items with * indicate a potential emergency and should be followed up as soon as possible or go to the Emergency Department if any problems should occur.  Please show the CHEMOTHERAPY ALERT CARD or IMMUNOTHERAPY ALERT CARD at check-in to the Emergency Department and triage nurse.  Should you have questions after your visit or need to cancel or reschedule your appointment, please contact [facility] [phone]  and follow the prompts.  Office hours are 8:00 a.m. to 4:30 p.m. Monday - Friday. Please note that voicemails left after 4:00 p.m. may not be returned until the following business day.  We are closed weekends and major holidays. You have access to a nurse at all times for urgent questions. Please call the main number to the clinic [phone] and follow the prompts.  For any non-urgent questions, you may also contact your provider using MyChart. We now offer e-Visits for anyone 40 and older to request care online for non-urgent  symptoms. For details visit [email or web address].   Also download the MyChart app! Go to the app store, search "MyChart", open the app, select [facility], and log in with your MyChart username and password.  Due to Covid, a mask is required upon entering the hospital/clinic. If you do not have a mask, one will be given to you upon arrival. For doctor visits, patients may have 1 support person aged 97 or older with them. For treatment visits, patients cannot have anyone with them due to current Covid guidelines and our immunocompromised population.

## 2021-08-06 LAB — T4, FREE: Free T4: 1.3 ng/dL (ref 0.82–1.77)

## 2021-08-06 LAB — TSH: TSH: 4.89 u[IU]/mL — ABNORMAL HIGH (ref 0.450–4.500)

## 2021-08-07 ENCOUNTER — Encounter: Payer: Self-pay | Admitting: Nutrition

## 2021-08-07 ENCOUNTER — Ambulatory Visit (INDEPENDENT_AMBULATORY_CARE_PROVIDER_SITE_OTHER): Payer: Commercial Managed Care - PPO | Admitting: Nurse Practitioner

## 2021-08-07 ENCOUNTER — Encounter: Payer: Commercial Managed Care - PPO | Attending: Nurse Practitioner | Admitting: Nutrition

## 2021-08-07 ENCOUNTER — Encounter: Payer: Self-pay | Admitting: Nurse Practitioner

## 2021-08-07 VITALS — BP 118/83 | HR 79 | Ht 65.0 in | Wt 248.0 lb

## 2021-08-07 DIAGNOSIS — E890 Postprocedural hypothyroidism: Secondary | ICD-10-CM | POA: Diagnosis not present

## 2021-08-07 DIAGNOSIS — E782 Mixed hyperlipidemia: Secondary | ICD-10-CM | POA: Insufficient documentation

## 2021-08-07 MED ORDER — LEVOTHYROXINE SODIUM 175 MCG PO TABS: 175.0000 ug | ORAL_TABLET | Freq: Every day | ORAL | 1 refills | Status: DC

## 2021-08-07 NOTE — Patient Instructions (Signed)

## 2021-08-07 NOTE — Patient Instructions (Signed)
Goals  Continue walking and start working out at the gym Continue to explore new recipes and meal prepping ideas Lose 1 lb per week. Look into [email or web address]

## 2021-08-07 NOTE — Progress Notes (Signed)
Medical Nutrition Therapy  Appointment Start time:  0930 Appointment End time:1000  Primary concerns today: Obesity,   Referral diagnosis: E66.9 Preferred learning style: NO preference  Learning readiness: Ready    NUTRITION ASSESSMENT Nutrition follow up obesity To see Rayetta Pigg, FNP [facility]. Has been meal prepping a lot more.It has helped a lot with making sure she has what she needs to eat at meal times.. Feels much better and less stressed. Lost 6 lbs down. She is now walking while her son rides his bike. She is completed her exercise goals. Changes made: cut out all sweets, bought sectional plate.  Drinks more water now. Cut out a lot of coffee. Feel a lot better.  Getting better sleep. Her husband is doing it with her and very supportive.   Previous goals set: Eat three meals per day-done Focus on more plant based foods-met Walk 3 times per week.-met Watch utube meal planning and meal prepping.-met Cut out fast foods and processed.- done Lose 1 lb per week-done, lost 6 lbs.   Eating habits and exercise has improved greatly. Doing very well.   Anthropometrics   Wt Readings from Last 3 Encounters:  08/07/21 248 lb (112.5 kg)  08/07/21 248 lb (112.5 kg)  07/23/21 254 lb 6.4 oz (115.4 kg)   Ht Readings from Last 3 Encounters:  08/07/21 _0  (1.651 m)  08/07/21 _1  (1.651 m)  07/23/21 _2  (1.676 m)   Body mass index is 41.27 kg/m. _3 @ Facility age limit for growth %iles is 20 years. Facility age limit for growth %iles is 20 years.   There is no height or weight on file to calculate BMI. _4 @ Facility age limit for growth %iles is 20 years. Facility age limit for growth %iles is 20 years.    Clinical Medical Hx: See chart Medications: see chart  Labs: No results found for: "HGBA1C"    Latest Ref Rng & Units 06/28/2021    7:34 PM 05/09/2021    4:04 PM 11/05/2020    9:07 AM  CMP  Glucose 70 - 99 mg/dL 112  97  81   BUN 6 - 20 mg/dL  _5 Creatinine 0.44 - 1.00 mg/dL 0.74  0.84  0.65   Sodium 135 - 145 mmol/L 137  139  138   Potassium 3.5 - 5.1 mmol/L 4.1  4.3  4.2   Chloride 98 - 111 mmol/L 107  105  103   CO2 22 - 32 mmol/L _6 Calcium 8.9 - 10.3 mg/dL 8.9  8.9  9.5   Total Protein 6.5 - 8.1 g/dL 8.0   7.0   Total Bilirubin 0.3 - 1.2 mg/dL 0.6   0.2   Alkaline Phos 38 - 126 U/L 68   101   AST 15 - 41 U/L 22   17   ALT 0 - 44 U/L 33   22    Lipid Panel     Component Value Date/Time   CHOL 178 11/05/2020 0907   TRIG 101 11/05/2020 0907   HDL 46 11/05/2020 0907   LDLCALC 114 (H) 11/05/2020 0907   LABVLDL 18 11/05/2020 0907    Notable Signs/Symptoms: fatigue  Lifestyle & Dietary Hx Lives with her husband and 3 kids.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  Works 6 to 6.   Estimated daily fluid intake: 32 oz Supplements:  Sleep: poor Stress / self-care: work balance and  family life Current average weekly physical activity: ADL  24-Hr Dietary Recall B) egg boiled, fruit,  or yogurt or oatmeal, water L) Chicken, salad/ vegetables, fruit, water D) Steamed broccoli and sweet potato and chicken, water   Estimated Energy Needs Calories: 1200 Carbohydrate: 135g Protein: 90g Fat: 33g   NUTRITION DIAGNOSIS  NI-1.7 Predicted excessive energy intake As related to Diet high in processed foods.  As evidenced by BMI > 40.   NUTRITION INTERVENTION  Nutrition education (E-1) on the following topics:  Lifestyle Medicine  - Whole Food, Plant Predominant Nutrition is highly recommended: Eat Plenty of vegetables, Mushrooms, fruits, Legumes, Whole Grains, Nuts, seeds in lieu of processed meats, processed snacks/pastries red meat, poultry, eggs.    -It is better to avoid simple carbohydrates including: Cakes, Sweet Desserts, Ice Cream, Soda (diet and regular), Sweet Tea, Candies, Chips, Cookies, Store Bought Juices, Alcohol in Excess of  1-2 drinks a day, Lemonade,  Artificial  Sweeteners, Doughnuts, Coffee Creamers, "Sugar-free" Products, etc, etc.  This is not a complete list.....  Exercise: If you are able: 30 -60 minutes a day ,4 days a week, or 150 minutes a week.  The longer the better.  Combine stretch, strength, and aerobic activities.  If you were told in the past that you have high risk for cardiovascular diseases, you may seek evaluation by your heart doctor prior to initiating moderate to intense exercise programs.   Handouts Provided Include  Lifestyle medicine Meal Planning [email or web address]   Learning Style & Readiness for Change Teaching method utilized: Visual & Auditory  Demonstrated degree of understanding via: Teach Back  Barriers to learning/adherence to lifestyle change: none  Goals Established by Pt Goals  Continue walking and start working out at the gym Continue to explore new recipes and meal prepping ideas Lose 1 lb per week. Look into [email or web address]   MONITORING & EVALUATION Dietary intake, weekly physical activity, and weight  in 3 month.  Next Steps  Patient is to work on better meal planning and eating meals on time during work shift.Marland Kitchen

## 2021-08-07 NOTE — Progress Notes (Signed)
08/07/2021     Endocrinology Follow Up Note    Subjective:    Patient ID: Paula Myers, female    DOB: 1985/03/09, PCP Renee Rival, FNP.   Past Medical History:  Diagnosis Date   Anemia    prior pregnancies   Anxiety    Depression    Dysrhythmia    "skipping" HR at beginning of pregnancy   H/O cervical polypectomy    Heartburn during pregnancy    History of cesarean section 08/22/2014   Hypertension 2005   while in labor   Hyperthyroidism    Iron deficiency anemia due to chronic blood loss 11/21/2020   Kidney stone    Palpitation    SVT (supraventricular tachycardia) (HCC)    SVT (supraventricular tachycardia) ([place])    Tachycardia     Past Surgical History:  Procedure Laterality Date   ABDOMINAL HYSTERECTOMY N/A 05/13/2021   Procedure: HYSTERECTOMY ABDOMINAL;  Surgeon: Janyth Pupa, DO;  Location: [facility];  Service: Gynecology;  Laterality: N/A;   CESAREAN SECTION     06/11/09; 05/25/2003   CESAREAN SECTION WITH BILATERAL TUBAL LIGATION Bilateral 08/22/2014   Procedure: CESAREAN SECTION WITH BILATERAL TUBAL LIGATION;  Surgeon: Sanjuana Kava, MD;  Location: [facility];  Service: Obstetrics;  Laterality: Bilateral;   DILATION AND CURETTAGE OF UTERUS  01/2008   ELBOW SURGERY     Left    Social History   Socioeconomic History   Marital status: Married    Spouse name: Not on file   Number of children: 3   Years of education: Not on file   Highest education level: Not on file  Occupational History   Occupation: [employer]    Comment: Dispatcher  Tobacco Use   Smoking status: Every Day    Packs/day: 0.25    Years: 20.00    Total pack years: 5.00    Types: Cigarettes   Smokeless tobacco: Never  Vaping Use   Vaping Use: Former  Substance and Sexual Activity   Alcohol use: Not Currently    Comment: maybe 1-2 per year   Drug use: No   Sexual activity: Yes    Birth control/protection: Surgical    Comment: hyst  Other Topics  Concern   Not on file  Social History Narrative   Not on file   Social Determinants of Health   Financial Resource Strain: Low Risk  (03/14/2021)   Overall Financial Resource Strain (CARDIA)    Difficulty of Paying Living Expenses: Not very hard  Food Insecurity: No Food Insecurity (03/14/2021)   Hunger Vital Sign    Worried About Running Out of Food in the Last Year: Never true    Ran Out of Food in the Last Year: Never true  Transportation Needs: No Transportation Needs (03/14/2021)   PRAPARE - Hydrologist (Medical): No    Lack of Transportation (Non-Medical): No  Physical Activity: Insufficiently Active (03/14/2021)   Exercise Vital Sign    Days of Exercise per Week: 3 days    Minutes of Exercise per Session: 20 min  Stress: No Stress Concern Present (03/14/2021)   [place]    Feeling of Stress : Only a little  Social Connections: Moderately Integrated (03/14/2021)   Social Connection and Isolation Panel [NHANES]    Frequency of Communication with Friends and Family: More than three times a week    Frequency of Social Gatherings with Friends and  Family: Once a week    Attends Religious Services: 1 to 4 times per year    Active Member of Clubs or Organizations: No    Attends Music therapist: Never    Marital Status: Married    Family History  Problem Relation Age of Onset   Cancer Paternal Grandfather        thyroid   Cancer Paternal Grandmother        ovarian   Diabetes Maternal Grandmother    Hypertension Maternal Grandmother    Heart disease Maternal Grandmother    Hypertension Mother    Diabetes Mother    Diabetes Brother     Outpatient Encounter Medications as of 08/07/2021  Medication Sig   acetaminophen (TYLENOL) 325 MG tablet Take 2 tablets (650 mg total) by mouth every 6 (six) hours as needed for moderate pain or mild pain.   ibuprofen (ADVIL) 600 MG  tablet Take 1 tablet (600 mg total) by mouth every 6 (six) hours as needed.   levothyroxine (SYNTHROID) 175 MCG tablet Take 1 tablet (175 mcg total) by mouth daily before breakfast.   nystatin (MYCOSTATIN/NYSTOP) powder Apply 1 application. topically 3 (three) times daily.   phentermine 15 MG capsule Take 1 capsule (15 mg total) by mouth every morning.   [DISCONTINUED] hydrochlorothiazide (MICROZIDE) 12.5 MG capsule TAKE 1 CAPSULE(12.5 MG) BY MOUTH DAILY (Patient not taking: Reported on 08/07/2021)   [DISCONTINUED] levothyroxine (SYNTHROID) 150 MCG tablet Take 1 tablet (150 mcg total) by mouth daily.   No facility-administered encounter medications on file as of 08/07/2021.    ALLERGIES: Allergies  Allergen Reactions   Levaquin [Levofloxacin In D5w] Anaphylaxis   Levofloxacin Anaphylaxis   Pertussis Vaccines Swelling    T-Dap injection caused localized swelling of arm. Has had Tetanus before without problems.   Megace [Megestrol] Other (See Comments)    Cause Depression   Tetanus-Diphth-Acell Pertussis Swelling   Venofer [Iron Sucrose] Other (See Comments)    Chest Pain    VACCINATION STATUS: Immunization History  Administered Date(s) Administered   DTaP 09/09/1983, 11/18/1983, 02/03/1984, 01/25/1985, 10/29/1988   Hepatitis B 12/09/1994, 01/14/1995, 06/16/1995   IPV 09/09/1983, 11/18/1983, 02/03/1984, 01/25/1985   Influenza,inj,Quad PF,6+ Mos 11/07/2020   Influenza-Unspecified 12/18/2002, 01/23/2014   MMR 10/28/1984, 12/26/2004   Moderna Sars-Covid-2 Vaccination 12/19/2019, 01/16/2020   Tdap 12/26/2004     HPI  Paula Myers is 36 y.o. female who presents today with a medical history as above. she is being seen in follow up after being seen in consultation for hyperthyroidism requested by Renee Rival, FNP.   she denies dysphagia, choking, shortness of breath, no recent voice change.    she does family history of thyroid dysfunction in her grandmother (Graves  disease) and mother (MNG requiring thyroidectomy), but denies family hx of thyroid cancer. she denies personal history of goiter. she is not on any anti-thyroid medications nor on any thyroid hormone supplements. She does intermittently use of Biotin containing supplements but has not taken it in a few weeks.   She underwent RAI ablation for Graves disease on 11/15/20 and has since been started on thyroid hormone replacement.   Review of systems  Constitutional: + Minimally fluctuating body weight,  current Body mass index is 41.27 kg/m. , no fatigue, no subjective hyperthermia, no subjective hypothermia Eyes: no blurry vision, no xerophthalmia ENT: no sore throat, no nodules palpated in throat, no dysphagia/odynophagia, no hoarseness Cardiovascular: no chest pain, no shortness of breath, no palpitations, no leg swelling Respiratory:  no cough, no shortness of breath Gastrointestinal: no nausea/vomiting/diarrhea Musculoskeletal: no muscle/joint aches Skin: no rashes, no hyperemia Neurological: no tremors, no numbness, no tingling, no dizziness Psychiatric: no depression, no anxiety   Objective:    BP 118/83   Pulse 79   Ht _0  (1.651 m)   Wt 248 lb (112.5 kg)   LMP 04/30/2021 Comment: Negative HCG on 05/09/2021  BMI 41.27 kg/m   Wt Readings from Last 3 Encounters:  08/07/21 248 lb (112.5 kg)  07/23/21 254 lb 6.4 oz (115.4 kg)  07/02/21 254 lb 6.4 oz (115.4 kg)     BP Readings from Last 3 Encounters:  08/07/21 118/83  08/01/21 96/72  07/25/21 122/86                        Physical Exam- Limited  Constitutional:  Body mass index is 41.27 kg/m. , not in acute distress, normal state of mind Eyes:  EOMI, no exophthalmos Neck: Supple Cardiovascular: RRR, no murmurs, rubs, or gallops, no edema Respiratory: Adequate breathing efforts, no crackles, rales, rhonchi, or wheezing Musculoskeletal: no gross deformities, strength intact in all four extremities, no gross restriction  of joint movements Skin:  no rashes, no hyperemia Neurological: no tremor with outstretched hands   CMP     Component Value Date/Time   NA 137 06/28/2021 1934   NA 138 11/05/2020 0907   K 4.1 06/28/2021 1934   CL 107 06/28/2021 1934   CO2 24 06/28/2021 1934   GLUCOSE 112 (H) 06/28/2021 1934   BUN 9 06/28/2021 1934   BUN 7 11/05/2020 0907   CREATININE 0.74 06/28/2021 1934   CALCIUM 8.9 06/28/2021 1934   PROT 8.0 06/28/2021 1934   PROT 7.0 11/05/2020 0907   ALBUMIN 4.3 06/28/2021 1934   ALBUMIN 4.3 11/05/2020 0907   AST 22 06/28/2021 1934   ALT 33 06/28/2021 1934   ALKPHOS 68 06/28/2021 1934   BILITOT 0.6 06/28/2021 1934   BILITOT 0.2 11/05/2020 0907   GFRNONAA >60 06/28/2021 1934   GFRAA >60 09/22/2019 1938     CBC    Component Value Date/Time   WBC 11.6 (H) 07/17/2021 0838   RBC 5.08 07/17/2021 0838   HGB 14.8 07/17/2021 0838   HGB 12.4 11/05/2020 0907   HCT 44.1 07/17/2021 0838   HCT 39.5 11/05/2020 0907   PLT 339 07/17/2021 0838   PLT 355 11/05/2020 0907   MCV 86.8 07/17/2021 0838   MCV 74 (L) 11/05/2020 0907   MCH 29.1 07/17/2021 0838   MCHC 33.6 07/17/2021 0838   RDW 13.0 07/17/2021 0838   RDW 20.3 (H) 11/05/2020 0907   LYMPHSABS 2.9 07/17/2021 0838   LYMPHSABS 3.3 (H) 11/05/2020 0907   MONOABS 0.7 07/17/2021 0838   EOSABS 0.1 07/17/2021 0838   EOSABS 0.1 11/05/2020 0907   BASOSABS 0.1 07/17/2021 0838   BASOSABS 0.1 11/05/2020 0907     Diabetic Labs (most recent): No results found for: "HGBA1C", "MICROALBUR"  Lipid Panel     Component Value Date/Time   CHOL 178 11/05/2020 0907   TRIG 101 11/05/2020 0907   HDL 46 11/05/2020 0907   LDLCALC 114 (H) 11/05/2020 0907   LABVLDL 18 11/05/2020 0907     Lab Results  Component Value Date   TSH 4.890 (H) 08/05/2021   TSH 18.700 (H) 05/29/2021   TSH 19.900 (H) 03/31/2021   TSH 47.300 (H) 02/04/2021   TSH <0.005 (L) 12/31/2020   TSH <0.005 (L) 10/07/2020  FREET4 1.30 08/05/2021   FREET4 0.88  05/29/2021   FREET4 0.95 03/31/2021   FREET4 0.13 (L) 02/04/2021   FREET4 1.28 12/31/2020   FREET4 1.84 (H) 10/07/2020     Uptake and Scan from 10/17/20 CLINICAL DATA:  Hyperthyroidism, TSH 0.450   EXAM: THYROID SCAN AND UPTAKE - 4 AND 24 HOURS   TECHNIQUE: Following oral administration of I-123 capsule, anterior planar imaging was acquired at 24 hours. Thyroid uptake was calculated with a thyroid probe at 4-6 hours and 24 hours.   RADIOPHARMACEUTICALS:  436 uCi I-123 sodium iodide p.o.   COMPARISON:  None   FINDINGS: Homogeneous tracer distribution in both thyroid lobes.   No focal areas of increased or decreased tracer localization.   4 hour I-123 uptake = 14.1% (normal 5-20%)   24 hour I-123 uptake = 31.1% (normal 10-30%)   IMPRESSION: Normal thyroid scan.   Minimally elevated 24 hour radio iodine uptake.   In the setting of hyperthyroidism, findings suggest Graves disease     Electronically Signed   By: Lavonia Dana M.D.   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  -She underwent RAI ablation on 11/15/20.    Her previsit thyroid function tests are consistent with slight under-replacement.  She is advised to increase Levothyroxine to 175 mcg po daily before breakfast (closer to total weight based dose of 180).    Will repeat again in 3 months for continued surveillance so dosage adjustments can be made if necessary.       -Patient is advised to  maintain close follow up with [facility], Dewaine Conger, FNP for primary care needs.    I spent 20 minutes in the care of the patient today including review of labs from Thyroid Function, CMP, and other relevant labs ; imaging/biopsy records (current and previous including abstractions from other facilities); face-to-face time discussing  her lab results and symptoms, medications doses, her options of short and long term treatment based on the latest standards of care / guidelines;   and documenting the encounter.  Dorian Heckle Dowdy  participated in the discussions, expressed understanding, and voiced agreement with the above plans.  All questions were answered to her satisfaction. she is encouraged to contact clinic should she have any questions or concerns prior to her return visit.    Follow up plan: Return in about 3 months (around 11/07/2021) for Thyroid follow up, Previsit labs.   Thank you for involving me in the care of this pleasant patient, and I will continue to update you with her progress.   Rayetta Pigg, [facility] [address] [place], [place] [address] Phone: [phone] Fax: [phone]  08/07/2021, 10:23 AM

## 2021-08-08 ENCOUNTER — Other Ambulatory Visit: Payer: Self-pay | Admitting: Nurse Practitioner

## 2021-08-23 ENCOUNTER — Encounter: Payer: Self-pay | Admitting: Nurse Practitioner

## 2021-08-26 ENCOUNTER — Other Ambulatory Visit: Payer: Self-pay | Admitting: Nurse Practitioner

## 2021-08-26 MED ORDER — PHENTERMINE HCL 37.5 MG PO CAPS: 37.5000 mg | ORAL_CAPSULE | ORAL | 0 refills | Status: DC

## 2021-08-26 NOTE — Telephone Encounter (Signed)
Please advise 

## 2021-09-23 ENCOUNTER — Ambulatory Visit (INDEPENDENT_AMBULATORY_CARE_PROVIDER_SITE_OTHER): Payer: Commercial Managed Care - PPO | Admitting: Nurse Practitioner

## 2021-09-23 ENCOUNTER — Encounter: Payer: Self-pay | Admitting: Nurse Practitioner

## 2021-09-23 DIAGNOSIS — F17200 Nicotine dependence, unspecified, uncomplicated: Secondary | ICD-10-CM

## 2021-09-23 NOTE — Progress Notes (Signed)
   Paula Myers     MRN: [id number]      DOB: [birth year]   HPI Paula Myers with past medical history of hypertension, hypothyroidism, morbid obesity, is here for follow up for obesity.  Obesity .currently on phentermine 37.5 mg daily ,initially had trouble sleeping but not anymore. Does walking exercises, plays basketball,  She has been doing meal planning, sees the nutritionist.  Denies palpitations     ROS Denies recent fever or chills. Denies sinus pressure, nasal congestion, ear pain or sore throat. Denies chest congestion, productive cough or wheezing. Denies chest pains, palpitations and leg swelling Denies abdominal pain, nausea, vomiting,diarrhea or constipation.   Denies dysuria, frequency, hesitancy or incontinence. Denies joint pain, swelling and limitation in mobility. Denies headaches, seizures, numbness, or tingling. Denies depression, anxiety or insomnia.    PE  BP 120/86 (BP Location: Right Arm, Patient Position: Sitting, Cuff Size: Large)   Pulse 93   Ht 5\' 5"  (1.651 m)   Wt 238 lb (108 kg)   LMP 04/30/2021 Comment: Negative HCG on 05/09/2021  SpO2 96%   BMI 39.61 kg/m   Patient alert and oriented and in no cardiopulmonary distress.  Chest: Clear to auscultation bilaterally.  CVS: S1, S2 no murmurs, no S3.Regular rate.  ABD: Soft non tender.   Ext: No edema  MS: Adequate ROM spine, shoulders, hips and knees. Psych: Good eye contact, normal affect. Memory intact not anxious or depressed appearing.  CNS: CN 2-12 intact, power,  normal throughout.no focal deficits noted.   Assessment & Plan  Morbid obesity (HCC)  Wt Readings from Last 3 Encounters:  09/23/21 238 lb (108 kg)  08/07/21 248 lb (112.5 kg)  08/07/21 248 lb (112.5 kg)  Patient has lost 10 pounds since last visit Doing well on phentermine 37.5 mg daily Patient counseled on low-carb diet, encouraged to engage in regular moderate exercises at least 150 minutes  weekly. Encourage collaboration with nutritionist  Current smoker Smokes about quarter pack/day  Asked about quitting: confirms that he/she currently smokes cigarettes Advise to quit smoking: Educated about QUITTING to reduce the risk of cancer, cardio and cerebrovascular disease. Assess willingness: Unwilling to quit at this time, but is working on cutting back. Assist with counseling and pharmacotherapy: Counseled for 5 minutes and literature provided. Arrange for follow up: follow up in 3-6 months and continue to offer help.

## 2021-09-23 NOTE — Assessment & Plan Note (Addendum)
  Wt Readings from Last 3 Encounters:  09/23/21 238 lb (108 kg)  08/07/21 248 lb (112.5 kg)  08/07/21 248 lb (112.5 kg)  Patient has lost 10 pounds since last visit Doing well on phentermine 37.5 mg daily Patient counseled on low-carb diet, encouraged to engage in regular moderate exercises at least 150 minutes weekly. Encourage collaboration with nutritionist

## 2021-09-23 NOTE — Patient Instructions (Signed)

## 2021-09-23 NOTE — Assessment & Plan Note (Signed)
Smokes about quarter pack/day  Asked about quitting: confirms that he/she currently smokes cigarettes Advise to quit smoking: Educated about QUITTING to reduce the risk of cancer, cardio and cerebrovascular disease. Assess willingness: Unwilling to quit at this time, but is working on cutting back. Assist with counseling and pharmacotherapy: Counseled for 5 minutes and literature provided. Arrange for follow up: follow up in 3-6 months and continue to offer help.

## 2021-09-30 ENCOUNTER — Other Ambulatory Visit: Payer: Self-pay | Admitting: Nurse Practitioner

## 2021-10-01 MED ORDER — PHENTERMINE HCL 37.5 MG PO CAPS: 37.5000 mg | ORAL_CAPSULE | ORAL | 0 refills | Status: DC

## 2021-11-10 ENCOUNTER — Ambulatory Visit: Payer: Commercial Managed Care - PPO | Admitting: Nurse Practitioner

## 2021-11-10 ENCOUNTER — Ambulatory Visit: Payer: Commercial Managed Care - PPO | Admitting: Nutrition

## 2021-11-13 ENCOUNTER — Other Ambulatory Visit: Payer: Self-pay | Admitting: Nurse Practitioner

## 2021-11-13 MED ORDER — PHENTERMINE HCL 37.5 MG PO CAPS
37.5000 mg | ORAL_CAPSULE | ORAL | 0 refills | Status: DC
Start: 2021-11-13 — End: 2021-12-02

## 2021-12-01 ENCOUNTER — Encounter: Payer: Self-pay | Admitting: Nurse Practitioner

## 2021-12-02 ENCOUNTER — Other Ambulatory Visit: Payer: Self-pay | Admitting: Nurse Practitioner

## 2021-12-02 LAB — T4, FREE: Free T4: 1.63 ng/dL (ref 0.82–1.77)

## 2021-12-02 LAB — TSH: TSH: 3.98 u[IU]/mL (ref 0.450–4.500)

## 2021-12-02 MED ORDER — PHENTERMINE HCL 37.5 MG PO TABS: 37.5000 mg | ORAL_TABLET | Freq: Every day | ORAL | 0 refills | Status: DC

## 2021-12-02 NOTE — Progress Notes (Unsigned)
phenter

## 2021-12-03 ENCOUNTER — Ambulatory Visit (INDEPENDENT_AMBULATORY_CARE_PROVIDER_SITE_OTHER): Payer: Commercial Managed Care - PPO | Admitting: Nurse Practitioner

## 2021-12-03 ENCOUNTER — Encounter: Payer: Self-pay | Admitting: Nurse Practitioner

## 2021-12-03 ENCOUNTER — Encounter: Payer: Commercial Managed Care - PPO | Attending: Nurse Practitioner | Admitting: Nutrition

## 2021-12-03 ENCOUNTER — Encounter: Payer: Self-pay | Admitting: Nutrition

## 2021-12-03 VITALS — BP 122/80 | HR 86 | Ht 65.0 in | Wt 234.6 lb

## 2021-12-03 DIAGNOSIS — I10 Essential (primary) hypertension: Secondary | ICD-10-CM | POA: Insufficient documentation

## 2021-12-03 DIAGNOSIS — E890 Postprocedural hypothyroidism: Secondary | ICD-10-CM | POA: Diagnosis not present

## 2021-12-03 DIAGNOSIS — F17200 Nicotine dependence, unspecified, uncomplicated: Secondary | ICD-10-CM | POA: Insufficient documentation

## 2021-12-03 DIAGNOSIS — E782 Mixed hyperlipidemia: Secondary | ICD-10-CM | POA: Insufficient documentation

## 2021-12-03 NOTE — Progress Notes (Signed)
12/03/2021     Endocrinology Follow Up Note    Subjective:    Patient ID: Paula Myers, female    DOB: [birth year], PCP Renee Rival, FNP.   Past Medical History:  Diagnosis Date   Anemia    prior pregnancies   Anxiety    Depression    Dysrhythmia    "skipping" HR at beginning of pregnancy   H/O cervical polypectomy    Heartburn during pregnancy    History of cesarean section 08/22/2014   Hypertension 2005   while in labor   Hyperthyroidism    Iron deficiency anemia due to chronic blood loss 11/21/2020   Kidney stone    Palpitation    SVT (supraventricular tachycardia) ([facility])    SVT (supraventricular tachycardia) ([facility])    Tachycardia     Past Surgical History:  Procedure Laterality Date   ABDOMINAL HYSTERECTOMY N/A 05/13/2021   Procedure: HYSTERECTOMY ABDOMINAL;  Surgeon: Janyth Pupa, DO;  Location: [facility];  Service: Gynecology;  Laterality: N/A;   CESAREAN SECTION     06/11/09; 05/25/2003   CESAREAN SECTION WITH BILATERAL TUBAL LIGATION Bilateral 08/22/2014   Procedure: CESAREAN SECTION WITH BILATERAL TUBAL LIGATION;  Surgeon: Sanjuana Kava, MD;  Location: [facility];  Service: Obstetrics;  Laterality: Bilateral;   DILATION AND CURETTAGE OF UTERUS  01/2008   ELBOW SURGERY     Left    Social History   Socioeconomic History   Marital status: Married    Spouse name: Not on file   Number of children: 3   Years of education: Not on file   Highest education level: Not on file  Occupational History   Occupation: [employer]    Comment: Dispatcher  Tobacco Use   Smoking status: Every Day    Packs/day: 0.25    Years: 20.00    Total pack years: 5.00    Types: Cigarettes   Smokeless tobacco: Never  Vaping Use   Vaping Use: Former  Substance and Sexual Activity   Alcohol use: Not Currently    Comment: maybe 1-2 per year   Drug use: No   Sexual activity: Yes    Birth control/protection: Surgical    Comment: hyst  Other Topics  Concern   Not on file  Social History Narrative   Not on file   Social Determinants of Health   Financial Resource Strain: Low Risk  (03/14/2021)   Overall Financial Resource Strain (CARDIA)    Difficulty of Paying Living Expenses: Not very hard  Food Insecurity: No Food Insecurity (03/14/2021)   Hunger Vital Sign    Worried About Running Out of Food in the Last Year: Never true    Ran Out of Food in the Last Year: Never true  Transportation Needs: No Transportation Needs (03/14/2021)   PRAPARE - Hydrologist (Medical): No    Lack of Transportation (Non-Medical): No  Physical Activity: Insufficiently Active (03/14/2021)   Exercise Vital Sign    Days of Exercise per Week: 3 days    Minutes of Exercise per Session: 20 min  Stress: No Stress Concern Present (03/14/2021)   [place]    Feeling of Stress : Only a little  Social Connections: Moderately Integrated (03/14/2021)   Social Connection and Isolation Panel [NHANES]    Frequency of Communication with Friends and Family: More than three times a week    Frequency of Social Gatherings with Friends and  Family: Once a week    Attends Religious Services: 1 to 4 times per year    Active Member of Clubs or Organizations: No    Attends Music therapist: Never    Marital Status: Married    Family History  Problem Relation Age of Onset   Cancer Paternal Grandfather        thyroid   Cancer Paternal Grandmother        ovarian   Diabetes Maternal Grandmother    Hypertension Maternal Grandmother    Heart disease Maternal Grandmother    Hypertension Mother    Diabetes Mother    Diabetes Brother     Outpatient Encounter Medications as of 12/03/2021  Medication Sig   acetaminophen (TYLENOL) 325 MG tablet Take 2 tablets (650 mg total) by mouth every 6 (six) hours as needed for moderate pain or mild pain.   ibuprofen (ADVIL) 600 MG  tablet Take 1 tablet (600 mg total) by mouth every 6 (six) hours as needed.   levothyroxine (SYNTHROID) 175 MCG tablet Take 1 tablet (175 mcg total) by mouth daily.   nystatin (MYCOSTATIN/NYSTOP) powder Apply 1 application. topically 3 (three) times daily.   phentermine (ADIPEX-P) 37.5 MG tablet Take 1 tablet (37.5 mg total) by mouth daily before breakfast.   No facility-administered encounter medications on file as of 12/03/2021.    ALLERGIES: Allergies  Allergen Reactions   Levaquin [Levofloxacin In D5w] Anaphylaxis   Levofloxacin Anaphylaxis   Pertussis Vaccines Swelling    T-Dap injection caused localized swelling of arm. Has had Tetanus before without problems.   Megace [Megestrol] Other (See Comments)    Cause Depression   Tetanus-Diphth-Acell Pertussis Swelling   Venofer [Iron Sucrose] Other (See Comments)    Chest Pain    VACCINATION STATUS: Immunization History  Administered Date(s) Administered   DTaP 09/09/1983, 11/18/1983, 02/03/1984, 01/25/1985, 10/29/1988   Hepatitis B 12/09/1994, 01/14/1995, 06/16/1995   IPV 09/09/1983, 11/18/1983, 02/03/1984, 01/25/1985   Influenza,inj,Quad PF,6+ Mos 11/07/2020   Influenza-Unspecified 12/18/2002, 01/23/2014   MMR 10/28/1984, 12/26/2004   Moderna Sars-Covid-2 Vaccination 12/19/2019, 01/16/2020   Tdap 12/26/2004     HPI  Paula Myers is 36 y.o. female who presents today with a medical history as above. she is being seen in follow up after being seen in consultation for hyperthyroidism requested by Renee Rival, FNP.   she denies dysphagia, choking, shortness of breath, no recent voice change.    she does family history of thyroid dysfunction in her grandmother (Graves disease) and mother (MNG requiring thyroidectomy), but denies family hx of thyroid cancer. she denies personal history of goiter. she is not on any anti-thyroid medications nor on any thyroid hormone supplements. She does intermittently use of Biotin  containing supplements but has not taken it in a few weeks.   She underwent RAI ablation for Graves disease on 11/15/20 and has since been started on thyroid hormone replacement.   Review of systems  Constitutional: + steadily decreasing body weight,  current Body mass index is 39.04 kg/m. , no fatigue, no subjective hyperthermia, no subjective hypothermia Eyes: no blurry vision, no xerophthalmia ENT: no sore throat, no nodules palpated in throat, no dysphagia/odynophagia, no hoarseness Cardiovascular: no chest pain, no shortness of breath, no palpitations, no leg swelling Respiratory: no cough, no shortness of breath Gastrointestinal: no nausea/vomiting/diarrhea Musculoskeletal: no muscle/joint aches Skin: no rashes, no hyperemia Neurological: no tremors, no numbness, no tingling, no dizziness Psychiatric: no depression, no anxiety   Objective:  BP 122/80 (BP Location: Left Arm, Patient Position: Sitting, Cuff Size: Large)   Pulse 86   Ht 5' 5"  (1.651 m)   Wt 234 lb 9.6 oz (106.4 kg)   LMP 04/30/2021 Comment: Negative HCG on 05/09/2021  BMI 39.04 kg/m   Wt Readings from Last 3 Encounters:  12/03/21 234 lb 9.6 oz (106.4 kg)  09/23/21 238 lb (108 kg)  08/07/21 248 lb (112.5 kg)     BP Readings from Last 3 Encounters:  12/03/21 122/80  09/23/21 120/86  08/07/21 118/83                        Physical Exam- Limited  Constitutional:  Body mass index is 39.04 kg/m. , not in acute distress, normal state of mind Eyes:  EOMI, no exophthalmos Neck: Supple Cardiovascular: RRR, no murmurs, rubs, or gallops, no edema Respiratory: Adequate breathing efforts, no crackles, rales, rhonchi, or wheezing Musculoskeletal: no gross deformities, strength intact in all four extremities, no gross restriction of joint movements Skin:  no rashes, no hyperemia Neurological: no tremor with outstretched hands   CMP     Component Value Date/Time   NA 137 06/28/2021 1934   NA 138  11/05/2020 0907   K 4.1 06/28/2021 1934   CL 107 06/28/2021 1934   CO2 24 06/28/2021 1934   GLUCOSE 112 (H) 06/28/2021 1934   BUN 9 06/28/2021 1934   BUN 7 11/05/2020 0907   CREATININE 0.74 06/28/2021 1934   CALCIUM 8.9 06/28/2021 1934   PROT 8.0 06/28/2021 1934   PROT 7.0 11/05/2020 0907   ALBUMIN 4.3 06/28/2021 1934   ALBUMIN 4.3 11/05/2020 0907   AST 22 06/28/2021 1934   ALT 33 06/28/2021 1934   ALKPHOS 68 06/28/2021 1934   BILITOT 0.6 06/28/2021 1934   BILITOT 0.2 11/05/2020 0907   GFRNONAA >60 06/28/2021 1934   GFRAA >60 09/22/2019 1938     CBC    Component Value Date/Time   WBC 11.6 (H) 07/17/2021 0838   RBC 5.08 07/17/2021 0838   HGB 14.8 07/17/2021 0838   HGB 12.4 11/05/2020 0907   HCT 44.1 07/17/2021 0838   HCT 39.5 11/05/2020 0907   PLT 339 07/17/2021 0838   PLT 355 11/05/2020 0907   MCV 86.8 07/17/2021 0838   MCV 74 (L) 11/05/2020 0907   MCH 29.1 07/17/2021 0838   MCHC 33.6 07/17/2021 0838   RDW 13.0 07/17/2021 0838   RDW 20.3 (H) 11/05/2020 0907   LYMPHSABS 2.9 07/17/2021 0838   LYMPHSABS 3.3 (H) 11/05/2020 0907   MONOABS 0.7 07/17/2021 0838   EOSABS 0.1 07/17/2021 0838   EOSABS 0.1 11/05/2020 0907   BASOSABS 0.1 07/17/2021 0838   BASOSABS 0.1 11/05/2020 0907     Diabetic Labs (most recent): No results found for: "HGBA1C", "MICROALBUR"  Lipid Panel     Component Value Date/Time   CHOL 178 11/05/2020 0907   TRIG 101 11/05/2020 0907   HDL 46 11/05/2020 0907   LDLCALC 114 (H) 11/05/2020 0907   LABVLDL 18 11/05/2020 0907     Lab Results  Component Value Date   TSH 3.980 12/01/2021   TSH 4.890 (H) 08/05/2021   TSH 18.700 (H) 05/29/2021   TSH 19.900 (H) 03/31/2021   TSH 47.300 (H) 02/04/2021   TSH <0.005 (L) 12/31/2020   TSH <0.005 (L) 10/07/2020   FREET4 1.63 12/01/2021   FREET4 1.30 08/05/2021   FREET4 0.88 05/29/2021   FREET4 0.95 03/31/2021   FREET4 0.13 (L)  02/04/2021   FREET4 1.28 12/31/2020   FREET4 1.84 (H) 10/07/2020      Uptake and Scan from 10/17/20 CLINICAL DATA:  Hyperthyroidism, TSH 0.450   EXAM: THYROID SCAN AND UPTAKE - 4 AND 24 HOURS   TECHNIQUE: Following oral administration of I-123 capsule, anterior planar imaging was acquired at 24 hours. Thyroid uptake was calculated with a thyroid probe at 4-6 hours and 24 hours.   RADIOPHARMACEUTICALS:  436 uCi I-123 sodium iodide p.o.   COMPARISON:  None   FINDINGS: Homogeneous tracer distribution in both thyroid lobes.   No focal areas of increased or decreased tracer localization.   4 hour I-123 uptake = 14.1% (normal 5-20%)   24 hour I-123 uptake = 31.1% (normal 10-30%)   IMPRESSION: Normal thyroid scan.   Minimally elevated 24 hour radio iodine uptake.   In the setting of hyperthyroidism, findings suggest Graves disease     Electronically Signed   By: Lavonia Dana M.D.   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  -She underwent RAI ablation on 11/15/20.    Her previsit thyroid function tests are consistent with appropriate hormone replacement.  She is advised to continue Levothyroxine 175 mcg po daily before breakfast.   - The correct intake of thyroid hormone (Levothyroxine, Synthroid), is on empty stomach first thing in the morning, with water, separated by at least 30 minutes from breakfast and other medications,  and separated by more than 4 hours from calcium, iron, multivitamins, acid reflux medications (PPIs).  - This  medication is a life-long medication and will be needed to correct thyroid hormone imbalances for the rest of your life.  The dose may change from time to time, based on thyroid blood work.  - It is extremely important to be consistent taking this medication, near the same time each morning.  -AVOID TAKING PRODUCTS CONTAINING BIOTIN (commonly found in Hair, Skin, Nails vitamins) AS IT INTERFERES WITH THE VALIDITY OF THYROID FUNCTION BLOOD TESTS.    Will repeat again in 3 months for continued surveillance so dosage adjustments can be made if necessary.       -Patient is advised to maintain close follow up with [facility], Dewaine Conger, FNP for primary care needs.    I spent 20 minutes in the care of the patient today including review of labs from Thyroid Function, CMP, and other relevant labs ; imaging/biopsy records (current and previous including abstractions from other facilities); face-to-face time discussing  her lab results and symptoms, medications doses, her options of short and long term treatment based on the latest standards of care / guidelines;   and documenting the encounter.  Dorian Heckle Ramson  participated in the discussions, expressed understanding, and voiced agreement with the above plans.  All questions were answered to her satisfaction. she is encouraged to contact clinic should she have any questions or concerns prior to her return visit.    Follow up plan: No follow-ups on  file.   Thank you for involving me in the care of this pleasant patient, and I will continue to update you with her progress.   Rayetta Pigg, [facility] [address] [place], [place] [address] Phone: [phone] Fax: [phone]  12/03/2021, 9:28 AM

## 2021-12-03 NOTE — Progress Notes (Signed)
Medical Nutrition Therapy  Appointment Start time:  0930 Appointment End [phone]  Primary concerns today: Obesity,   Referral diagnosis: E66.9 Preferred learning style: NO preference  Learning readiness: Ready    NUTRITION ASSESSMENT Nutrition follow up obesity To see Ronny Bacon, FNP today at [facility]. Has been sick a lot recently.  Had COVID from being on a cruise. Working on meal planning and taking meals to work. Wt is down 4 lbs. Has been doing [employer] [facility] website.  Eating habits and exercise has improved greatly. Doing very well.  Anthropometrics   Wt Readings from Last 3 Encounters:  12/03/21 234 lb 9.6 oz (106.4 kg)  09/23/21 238 lb (108 kg)  08/07/21 248 lb (112.5 kg)   Ht Readings from Last 3 Encounters:  12/03/21 5\' 5"  (1.651 m)  09/23/21 5\' 5"  (1.651 m)  08/07/21 5\' 5"  (1.651 m)   There is no height or weight on file to calculate BMI. @BMIFA @ Facility age limit for growth %iles is 20 years. Facility age limit for growth %iles is 20 years.   There is no height or weight on file to calculate BMI. @BMIFA @ Facility age limit for growth %iles is 20 years. Facility age limit for growth %iles is 20 years.    Clinical Medical Hx: See chart Medications: see chart  Labs: No results found for: "HGBA1C"    Latest Ref Rng & Units 06/28/2021    7:34 PM 05/09/2021    4:04 PM 11/05/2020    9:07 AM  CMP  Glucose 70 - 99 mg/dL  97  81   BUN 6 - 20 mg/dL 9  9  7    Creatinine 0.44 - 1.00 mg/dL  06/30/2021  07/09/2021   Sodium 135 - 145 mmol/L 137  139  138   Potassium 3.5 - 5.1 mmol/L 4.1  4.3  4.2   Chloride 98 - 111 mmol/L 107  105  103   CO2 22 - 32 mmol/L 24  25  20    Calcium 8.9 - 10.3 mg/dL 8.9  8.9  9.5   Total Protein 6.5 - 8.1 g/dL 8.0   7.0   Total Bilirubin 0.3 - 1.2 mg/dL 0.6   0.2   Alkaline Phos 38 - 126 U/L 68   101   AST 15 - 41 U/L 22   17   ALT 0 - 44 U/L 33   22    Lipid Panel     Component Value Date/Time   CHOL 178 11/05/2020 0907    TRIG 101 11/05/2020 0907   HDL 46 11/05/2020 0907   LDLCALC 114 (H) 11/05/2020 0907   LABVLDL 18 11/05/2020 0907    Notable Signs/Symptoms: fatigue  Lifestyle & Dietary Hx Lives with her husband and 3 kids.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  Works 6 to 6.   Estimated daily fluid intake: 32 oz Supplements:  Sleep: poor Stress / self-care: work balance and family life Current average weekly physical activity: ADL  24-Hr Dietary Recall B) egg boiled, fruit or overnight oats L) Baked chicken and salad, water D) Chicken, steamed veggies, water    Estimated Energy Needs Calories: 1200 Carbohydrate: 135g Protein: 90g Fat: 33g   NUTRITION DIAGNOSIS  NI-1.7 Predicted excessive energy intake As related to Diet high in processed foods.  As evidenced by BMI > 40.   NUTRITION INTERVENTION  Nutrition education (E-1) on the following topics:  Lifestyle Medicine  - Whole Food, Plant Predominant Nutrition is highly recommended:  Eat Plenty of vegetables, Mushrooms, fruits, Legumes, Whole Grains, Nuts, seeds in lieu of processed meats, processed snacks/pastries red meat, poultry, eggs.    -It is better to avoid simple carbohydrates including: Cakes, Sweet Desserts, Ice Cream, Soda (diet and regular), Sweet Tea, Candies, Chips, Cookies, Store Bought Juices, Alcohol in Excess of  1-2 drinks a day, Lemonade,  Artificial Sweeteners, Doughnuts, Coffee Creamers, "Sugar-free" Products, etc, etc.  This is not a complete list.....  Exercise: If you are able: 30 -60 minutes a day ,4 days a week, or 150 minutes a week.  The longer the better.  Combine stretch, strength, and aerobic activities.  If you were told in the past that you have high risk for cardiovascular diseases, you may seek evaluation by your heart doctor prior to initiating moderate to intense exercise programs.   Handouts Provided Include  Lifestyle medicine Meal Planning [email or web address]   Learning Style  & Readiness for Change Teaching method utilized: Visual & Auditory  Demonstrated degree of understanding via: Teach Back  Barriers to learning/adherence to lifestyle change: none  Goals Established by Pt Goals  Increase exercise 2 miles 4-5 times per week See if you want to join the gym. Keep meal prepping. Keep up the great job!  MONITORING & EVALUATION Dietary intake, weekly physical activity, and weight  in 3 month.  Next Steps  Patient is to work on better meal planning and eating meals on time during work shift.Marland Kitchen

## 2021-12-03 NOTE — Patient Instructions (Signed)

## 2021-12-03 NOTE — Patient Instructions (Signed)
Goals  Increase exercise 2 miles 4-5 times per week Look at joining the gym. Keep meal prepping. Keep up the great job!

## 2021-12-11 ENCOUNTER — Encounter (HOSPITAL_COMMUNITY): Payer: Self-pay | Admitting: Hematology

## 2021-12-18 ENCOUNTER — Encounter: Payer: Self-pay | Admitting: Family Medicine

## 2021-12-18 ENCOUNTER — Ambulatory Visit (INDEPENDENT_AMBULATORY_CARE_PROVIDER_SITE_OTHER): Payer: Commercial Managed Care - PPO | Admitting: Family Medicine

## 2021-12-18 VITALS — BP 128/84 | HR 92 | Resp 16 | Ht 65.0 in | Wt 233.1 lb

## 2021-12-18 DIAGNOSIS — Z23 Encounter for immunization: Secondary | ICD-10-CM | POA: Insufficient documentation

## 2021-12-18 DIAGNOSIS — L700 Acne vulgaris: Secondary | ICD-10-CM

## 2021-12-18 DIAGNOSIS — Z0001 Encounter for general adult medical examination with abnormal findings: Secondary | ICD-10-CM

## 2021-12-18 DIAGNOSIS — N644 Mastodynia: Secondary | ICD-10-CM | POA: Insufficient documentation

## 2021-12-18 HISTORY — DX: Encounter for immunization: Z23

## 2021-12-18 HISTORY — DX: Encounter for general adult medical examination with abnormal findings: Z00.01

## 2021-12-18 MED ORDER — TRETINOIN 0.025 % EX CREA: TOPICAL_CREAM | Freq: Every day | CUTANEOUS | 0 refills | Status: DC

## 2021-12-18 MED ORDER — BENZOYL PEROXIDE-ERYTHROMYCIN 5-3 % EX GEL: Freq: Two times a day (BID) | CUTANEOUS | 0 refills | Status: DC

## 2021-12-18 NOTE — Assessment & Plan Note (Signed)
Will get imaging studies of the breast to rule out any abnormalities concerning of breast cancer

## 2021-12-18 NOTE — Patient Instructions (Addendum)
I appreciate the opportunity to provide care to you today!    Follow up:  3 months  Acne vulgaris  I sent a prescription for topical Tretinoin ( Retin-A) and Benzamycin gel to your pharmacy for your acne. Please apply the topical  Tretinoin (Retin-A) cream every other day at bedtime for 2 to 3 weeks then daily application at bedtime  Please stop by [facility] hospital to get a mammogram of your breasts  Thank you for getting your flu vaccine today     Please continue to a heart-healthy diet and increase your physical activities. Try to exercise for 42mins at least three times a week.      It was a pleasure to see you and I look forward to continuing to work together on your health and well-being. Please do not hesitate to call the office if you need care or have questions about your care.   Have a wonderful day and week. With Gratitude, Alvira Monday MSN, FNP-BC

## 2021-12-18 NOTE — Progress Notes (Signed)
Complete physical exam  Patient: Paula Myers   DOB: Nov 15, 1985   36 y.o. Female  MRN: [id number]  Subjective:    Chief Complaint  Patient presents with   Annual Exam    Felt a tenderness in her left breast, not a lump but it was tender    Acne    Has bad acne on her back     Paula Myers is a 36 y.o. female who presents today for a complete physical exam. She reports consuming a general diet.  She reports doing a lot of yard work at home as her physical activities.  She generally feels well. She reports sleeping well. She does have additional problems to discuss today.   Acne vulgaris: She reports increased papules with comedones on her lower legs, back, and buttocks since having hypothyroidism.  She takes Synthroid 175 mcg daily with minimal relief of her acne symptoms. she reports the onset of symptoms in 2022.  She has tried over-the-counter regimen with minimal relief of her symptoms.     Mastodynia: Onset of symptoms 2 months ago.  She reports pain in left lower quadrant of her breast with palpation.  She denies retractions and nipple inversion, skin texture and dimpling changes, and nipple discharge.  She reports allergies to the tdap vaccine, but she will be getting the flu vaccination today.  Most recent fall risk assessment:    09/23/2021   10:45 AM  Fall Risk   Falls in the past year? 0  Number falls in past yr: 0  Injury with Fall? 0  Risk for fall due to : No Fall Risks  Follow up Falls evaluation completed     Most recent depression screenings:    12/18/2021    8:12 AM 09/23/2021   10:45 AM  PHQ 2/9 Scores  PHQ - 2 Score 0 0    Vision:Within last year  Patient Active Problem List   Diagnosis Date Noted   Mastodynia of left breast 12/18/2021   Need for immunization against influenza 12/18/2021   Encounter for annual general medical examination with abnormal findings in adult 12/18/2021   Morbid obesity ([place]) 06/17/2021   Current smoker  06/17/2021   Hypertension 04/10/2021   Elevated BP without diagnosis of hypertension 03/14/2021   Menorrhagia with regular cycle 03/14/2021   Routine cervical smear 03/14/2021   Iron deficiency anemia due to chronic blood loss 11/21/2020   Acne 11/07/2020   Encounter for general adult medical examination with abnormal findings 10/07/2020   Wound, open, nose 10/07/2020   Screening due 10/07/2020   Hyperthyroidism 10/07/2020   IDA (iron deficiency anemia) 10/07/2020   Cervical high risk HPV (human papillomavirus) test positive 07/31/2016   Depression 10/02/2015   Breast lump 10/02/2015   Past Medical History:  Diagnosis Date   Anemia    prior pregnancies   Anxiety    Depression    Dysrhythmia    "skipping" HR at beginning of pregnancy   H/O cervical polypectomy    Heartburn during pregnancy    History of cesarean section 08/22/2014   Hypertension 2005   while in labor   Hyperthyroidism    Iron deficiency anemia due to chronic blood loss 11/21/2020   Kidney stone    Palpitation    SVT (supraventricular tachycardia)    SVT (supraventricular tachycardia)    Tachycardia    Past Surgical History:  Procedure Laterality Date   ABDOMINAL HYSTERECTOMY N/A 05/13/2021   Procedure: HYSTERECTOMY ABDOMINAL;  Surgeon: Janyth Pupa,  DO;  Location: [facility];  Service: Gynecology;  Laterality: N/A;   CESAREAN SECTION     06/11/09; 05/25/2003   CESAREAN SECTION WITH BILATERAL TUBAL LIGATION Bilateral 08/22/2014   Procedure: CESAREAN SECTION WITH BILATERAL TUBAL LIGATION;  Surgeon: Sanjuana Kava, MD;  Location: [facility];  Service: Obstetrics;  Laterality: Bilateral;   DILATION AND CURETTAGE OF UTERUS  01/2008   ELBOW SURGERY     Left   Social History   Tobacco Use   Smoking status: Every Day    Packs/day: 0.25    Years: 20.00    Total pack years: 5.00    Types: Cigarettes   Smokeless tobacco: Never  Vaping Use   Vaping Use: Former  Substance Use Topics   Alcohol use: Not Currently     Comment: maybe 1-2 per year   Drug use: No   Social History   Socioeconomic History   Marital status: Married    Spouse name: Not on file   Number of children: 3   Years of education: Not on file   Highest education level: Not on file  Occupational History   Occupation: [employer]    Comment: Dispatcher  Tobacco Use   Smoking status: Every Day    Packs/day: 0.25    Years: 20.00    Total pack years: 5.00    Types: Cigarettes   Smokeless tobacco: Never  Vaping Use   Vaping Use: Former  Substance and Sexual Activity   Alcohol use: Not Currently    Comment: maybe 1-2 per year   Drug use: No   Sexual activity: Yes    Birth control/protection: Surgical    Comment: hyst  Other Topics Concern   Not on file  Social History Narrative   Not on file   Social Determinants of Health   Financial Resource Strain: Low Risk  (03/14/2021)   Overall Financial Resource Strain (CARDIA)    Difficulty of Paying Living Expenses: Not very hard  Food Insecurity: No Food Insecurity (03/14/2021)   Hunger Vital Sign    Worried About Running Out of Food in the Last Year: Never true    Ran Out of Food in the Last Year: Never true  Transportation Needs: No Transportation Needs (03/14/2021)   PRAPARE - Hydrologist (Medical): No    Lack of Transportation (Non-Medical): No  Physical Activity: Insufficiently Active (03/14/2021)   Exercise Vital Sign    Days of Exercise per Week: 3 days    Minutes of Exercise per Session: 20 min  Stress: No Stress Concern Present (03/14/2021)   [place]    Feeling of Stress : Only a little  Social Connections: Moderately Integrated (03/14/2021)   Social Connection and Isolation Panel [NHANES]    Frequency of Communication with Friends and Family: More than three times a week    Frequency of Social Gatherings with Friends and Family: Once a week    Attends  Religious Services: 1 to 4 times per year    Active Member of [employer] or Organizations: No    Attends Archivist Meetings: Never    Marital Status: Married  Human resources officer Violence: Not At Risk (03/14/2021)   Humiliation, Afraid, Rape, and Kick questionnaire    Fear of Current or Ex-Partner: No    Emotionally Abused: No    Physically Abused: No    Sexually Abused: No   Family Status  Relation Name Status  PGF  Alive   PGM  Deceased   MGM  Deceased   MGF  Deceased   Father  Alive   Mother  Alive   Brother  Alive   Son  Alive   Son  Alive   Daughter  Alive   Family History  Problem Relation Age of Onset   Cancer Paternal Grandfather        thyroid   Cancer Paternal Grandmother        ovarian   Diabetes Maternal Grandmother    Hypertension Maternal Grandmother    Heart disease Maternal Grandmother    Hypertension Mother    Diabetes Mother    Diabetes Brother    Allergies  Allergen Reactions   Levaquin [Levofloxacin In D5w] Anaphylaxis   Levofloxacin Anaphylaxis   Pertussis Vaccines Swelling    T-Dap injection caused localized swelling of arm. Has had Tetanus before without problems.   Megace [Megestrol] Other (See Comments)    Cause Depression   Tetanus-Diphth-Acell Pertussis Swelling   Venofer [Iron Sucrose] Other (See Comments)    Chest Pain      Patient Care Team: Alvira Monday, FNP as PCP - General (Family Medicine)   Outpatient Medications Prior to Visit  Medication Sig   acetaminophen (TYLENOL) 325 MG tablet Take 2 tablets (650 mg total) by mouth every 6 (six) hours as needed for moderate pain or mild pain.   ibuprofen (ADVIL) 600 MG tablet Take 1 tablet (600 mg total) by mouth every 6 (six) hours as needed.   levothyroxine (SYNTHROID) 175 MCG tablet Take 1 tablet (175 mcg total) by mouth daily.   nystatin (MYCOSTATIN/NYSTOP) powder Apply 1 application. topically 3 (three) times daily.   phentermine (ADIPEX-P) 37.5 MG tablet Take 1 tablet  (37.5 mg total) by mouth daily before breakfast. (Patient not taking: Reported on 12/18/2021)   No facility-administered medications prior to visit.    Review of Systems  Constitutional:  Negative for malaise/fatigue and weight loss.  HENT:  Negative for hearing loss and tinnitus.   Eyes:  Negative for pain and discharge.  Respiratory:  Negative for shortness of breath and wheezing.   Cardiovascular:  Negative for chest pain and leg swelling.  Gastrointestinal:  Negative for abdominal pain, nausea and vomiting.  Genitourinary:  Negative for frequency and urgency.  Musculoskeletal:  Negative for back pain.  Skin:  Positive for rash.  Neurological:  Negative for dizziness and headaches.  Psychiatric/Behavioral:  Negative for memory loss.           Objective:     BP 128/84   Pulse 92   Resp 16   Ht _0  (1.651 m)   Wt 233 lb 1.9 oz (105.7 kg)   LMP 04/30/2021 Comment: Negative HCG on 05/09/2021  SpO2 97%   BMI 38.79 kg/m  BP Readings from Last 3 Encounters:  12/18/21 128/84  12/03/21 122/80  09/23/21 120/86   Wt Readings from Last 3 Encounters:  12/18/21 233 lb 1.9 oz (105.7 kg)  12/03/21 234 lb (106.1 kg)  12/03/21 234 lb 9.6 oz (106.4 kg)      Physical Exam HENT:     Head: Normocephalic.     Right Ear: External ear normal.     Left Ear: External ear normal.     Nose: No congestion.     Mouth/Throat:     Mouth: Mucous membranes are moist.  Eyes:     Extraocular Movements: Extraocular movements intact.     Pupils: Pupils are equal, round,  and reactive to light.  Cardiovascular:     Rate and Rhythm: Normal rate and regular rhythm.     Pulses: Normal pulses.     Heart sounds: Normal heart sounds.  Pulmonary:     Effort: Pulmonary effort is normal.     Breath sounds: Normal breath sounds.  Chest:  Breasts:    Right: No swelling, bleeding, inverted nipple, mass, nipple discharge, skin change or tenderness.     Left: Tenderness present. No swelling,  bleeding, inverted nipple, mass, nipple discharge or skin change.  Abdominal:     Palpations: Abdomen is soft.     Tenderness: There is no right CVA tenderness or left CVA tenderness.  Musculoskeletal:     Right lower leg: No edema.     Left lower leg: No edema.  Skin:    Findings: Lesion present.     Comments: Presence of papules and pustules and pulse chills with comedones on the back, behind her lower legs and buttocks  Neurological:     Mental Status: She is alert.      No results found for any visits on 12/18/21. Last CBC Lab Results  Component Value Date   WBC 11.6 (H) 07/17/2021   HGB 14.8 07/17/2021   HCT 44.1 07/17/2021   MCV 86.8 07/17/2021   MCH 29.1 07/17/2021   RDW 13.0 07/17/2021   PLT 339 65/46/5035   Last metabolic panel Lab Results  Component Value Date   GLUCOSE 112 (H) 06/28/2021   NA 137 06/28/2021   K 4.1 06/28/2021   CL 107 06/28/2021   CO2 24 06/28/2021   BUN 9 06/28/2021   CREATININE 0.74 06/28/2021   GFRNONAA >60 06/28/2021   CALCIUM 8.9 06/28/2021   PROT 8.0 06/28/2021   ALBUMIN 4.3 06/28/2021   LABGLOB 2.7 11/05/2020   AGRATIO 1.6 11/05/2020   BILITOT 0.6 06/28/2021   ALKPHOS 68 06/28/2021   AST 22 06/28/2021   ALT 33 06/28/2021   ANIONGAP 6 06/28/2021   Last lipids Lab Results  Component Value Date   CHOL 178 11/05/2020   HDL 46 11/05/2020   LDLCALC 114 (H) 11/05/2020   TRIG 101 11/05/2020   Last hemoglobin A1c No results found for: "HGBA1C" Last thyroid functions Lab Results  Component Value Date   TSH 3.980 12/01/2021   Last vitamin D No results found for: "25OHVITD2", "25OHVITD3", "VD25OH" Last vitamin B12 and Folate No results found for: "VITAMINB12", "FOLATE"      Assessment & Plan:    Routine Health Maintenance and Physical Exam  Immunization History  Administered Date(s) Administered   DTaP 09/09/1983, 11/18/1983, 02/03/1984, 01/25/1985, 10/29/1988   Hepatitis B 12/09/1994, 01/14/1995, 06/16/1995   IPV  09/09/1983, 11/18/1983, 02/03/1984, 01/25/1985   Influenza,inj,Quad PF,6+ Mos 11/07/2020, 12/18/2021   Influenza-Unspecified 12/18/2002, 01/23/2014   MMR 10/28/1984, 12/26/2004   Moderna Sars-Covid-2 Vaccination 12/19/2019, 01/16/2020   Tdap 12/26/2004    Health Maintenance  Topic Date Due   TETANUS/TDAP  12/27/2014   PAP SMEAR-Modifier  03/14/2024   INFLUENZA VACCINE  Completed   Hepatitis C Screening  Completed   HIV Screening  Completed   HPV VACCINES  Aged Out   COVID-19 Vaccine  Discontinued    Discussed health benefits of physical activity, and encouraged her to engage in regular exercise appropriate for her age and condition.  Problem List Items Addressed This Visit       Musculoskeletal and Integument   Acne    Will treat moderate acne with topical retinoids and topical antimicrobial  topical Tretinoin ( Retin-A) and Benzamycin gel ordered Encouraged to apply topical tretinoin (Retin-A) cream every other day at bedtime for 2 to 3 weeks then daily application at bedtime Encouraged to use topical Benzamycin to increase effectiveness      Relevant Medications   benzoyl peroxide-erythromycin (BENZAMYCIN) gel   tretinoin (RETIN-A) 0.025 % cream     Other   Mastodynia of left breast    Will get imaging studies of the breast to rule out any abnormalities concerning of breast cancer      Relevant Orders   MM DIAG BREAST TOMO BILATERAL   US BREAST LTD UNI LEFT INC AXILLA   US BREAST LTD UNI RIGHT INC AXILLA   Need for immunization against influenza    Patient educated on CDC recommendation for the vaccine. Verbal consent was obtained from the patient, vaccine administered by nurse, no sign of adverse reactions noted at this time. Patient education on arm soreness and use of tylenol or ibuprofen for this patient  was discussed. Patient educated on the signs and symptoms of adverse effect and advise to contact the office if they occur.       Relevant Orders   Flu  Vaccine QUAD 65moIM (Fluarix, Fluzone & Alfiuria Quad PF) (Completed)   Encounter for annual general medical examination with abnormal findings in adult - Primary    Physical exam as documented Counseling is done on healthy lifestyle involving commitment to 150 minutes of exercise per week,  Discussed heart-healthy diet and attaining a healthy weight  she received her flu vaccination today           Return in about 3 months (around 03/20/2022).     GAlvira Monday FNP

## 2021-12-18 NOTE — Assessment & Plan Note (Signed)
Will treat moderate acne with topical retinoids and topical antimicrobial  topical Tretinoin ( Retin-A) and Benzamycin gel ordered Encouraged to apply topical tretinoin (Retin-A) cream every other day at bedtime for 2 to 3 weeks then daily application at bedtime Encouraged to use topical Benzamycin to increase effectiveness

## 2021-12-18 NOTE — Assessment & Plan Note (Signed)
Physical exam as documented Counseling is done on healthy lifestyle involving commitment to 150 minutes of exercise per week,  Discussed heart-healthy diet and attaining a healthy weight  she received her flu vaccination today

## 2021-12-18 NOTE — Assessment & Plan Note (Signed)
Patient educated on CDC recommendation for the vaccine. Verbal consent was obtained from the patient, vaccine administered by nurse, no sign of adverse reactions noted at this time. Patient education on arm soreness and use of tylenol or ibuprofen for this patient  was discussed. Patient educated on the signs and symptoms of adverse effect and advise to contact the office if they occur.  

## 2021-12-25 ENCOUNTER — Ambulatory Visit (HOSPITAL_COMMUNITY)
Admission: RE | Admit: 2021-12-25 | Discharge: 2021-12-25 | Disposition: A | Discharge status: Home / Self Care | Payer: Commercial Managed Care - PPO | Source: Ambulatory Visit | Attending: Family Medicine | Admitting: Family Medicine

## 2021-12-25 DIAGNOSIS — N644 Mastodynia: Secondary | ICD-10-CM | POA: Insufficient documentation

## 2021-12-27 NOTE — Addendum Note (Signed)
Addended byAlvira Monday on: 12/27/2021 12:14 AM   Modules accepted: Level of Service

## 2022-01-14 NOTE — Progress Notes (Unsigned)
[facility] [address], [place] [address]   CLINIC:  Medical Oncology/Hematology  PCP:  Gilmore Laroche, FNP [address] [place] [address] [phone]   REASON FOR VISIT: Iron deficiency anemia   CURRENT THERAPY: Intermittent IV iron infusions   INTERVAL HISTORY: Paula Myers is contacted today for follow-up of her iron deficiency anemia.  She was last evaluated via telemedicine visit by Rojelio Brenner PA-C on 07/18/2021.  She felt improved energy after her Feraheme on 07/25/2021 and 08/01/2021.  She has been feeling slightly more tired ever since she had COVID in September 2023. She had total abdominal hysterectomy completed on 05/13/2021 and is no longer having periods.   She denies any bright red blood per rectum or melena.  She has occasional headaches. She denies any pica, restless legs, chest pain, dyspnea on exertion, lightheadedness, or syncope.  She has 50% energy and 100% appetite. She endorses that she is maintaining a stable weight.   REVIEW OF SYSTEMS:  Review of Systems  Constitutional:  Positive for fatigue. Negative for appetite change, chills, diaphoresis, fever and unexpected weight change.  HENT:   Negative for lump/mass and nosebleeds.   Eyes:  Negative for eye problems.  Respiratory:  Negative for cough, hemoptysis and shortness of breath.   Cardiovascular:  Negative for chest pain, leg swelling and palpitations.  Gastrointestinal:  Negative for abdominal pain, blood in stool, constipation, diarrhea, nausea and vomiting.  Genitourinary:  Negative for hematuria.   Skin: Negative.   Neurological:  Negative for dizziness, headaches and light-headedness.  Hematological:  Does not bruise/bleed easily.      PAST MEDICAL/SURGICAL HISTORY:  Past Medical History:  Diagnosis Date   Anemia    prior pregnancies   Anxiety    Depression    Dysrhythmia    "skipping" HR at beginning of pregnancy   H/O cervical  polypectomy    Heartburn during pregnancy    History of cesarean section 08/22/2014   Hypertension 2005   while in labor   Hyperthyroidism    Iron deficiency anemia due to chronic blood loss 11/21/2020   Kidney stone    Palpitation    SVT (supraventricular tachycardia)    SVT (supraventricular tachycardia)    Tachycardia    Past Surgical History:  Procedure Laterality Date   ABDOMINAL HYSTERECTOMY N/A 05/13/2021   Procedure: HYSTERECTOMY ABDOMINAL;  Surgeon: Myna Hidalgo, DO;  Location: [facility];  Service: Gynecology;  Laterality: N/A;   CESAREAN SECTION     06/11/09; 05/25/2003   CESAREAN SECTION WITH BILATERAL TUBAL LIGATION Bilateral 08/22/2014   Procedure: CESAREAN SECTION WITH BILATERAL TUBAL LIGATION;  Surgeon: Essie Hart, MD;  Location: [facility];  Service: Obstetrics;  Laterality: Bilateral;   DILATION AND CURETTAGE OF UTERUS  01/2008   ELBOW SURGERY     Left     SOCIAL HISTORY:  Social History   Socioeconomic History   Marital status: Married    Spouse name: Not on file   Number of children: 3   Years of education: Not on file   Highest education level: Not on file  Occupational History   Occupation: [employer]    Comment: Dispatcher  Tobacco Use   Smoking status: Every Day    Packs/day: 0.25    Years: 20.00    Total pack years: 5.00    Types: Cigarettes   Smokeless tobacco: Never  Vaping Use   Vaping Use: Former  Substance and Sexual Activity  Alcohol use: Not Currently    Comment: maybe 1-2 per year   Drug use: No   Sexual activity: Yes    Birth control/protection: Surgical    Comment: hyst  Other Topics Concern   Not on file  Social History Narrative   Not on file   Social Determinants of Health   Financial Resource Strain: Low Risk  (03/14/2021)   Overall Financial Resource Strain (CARDIA)    Difficulty of Paying Living Expenses: Not very hard  Food Insecurity: No Food Insecurity (03/14/2021)   Hunger Vital Sign    Worried About  Running Out of Food in the Last Year: Never true    Ran Out of Food in the Last Year: Never true  Transportation Needs: No Transportation Needs (03/14/2021)   PRAPARE - Administrator, Civil Service (Medical): No    Lack of Transportation (Non-Medical): No  Physical Activity: Insufficiently Active (03/14/2021)   Exercise Vital Sign    Days of Exercise per Week: 3 days    Minutes of Exercise per Session: 20 min  Stress: No Stress Concern Present (03/14/2021)   [employer] of Occupational Health - Occupational Stress Questionnaire    Feeling of Stress : Only a little  Social Connections: Moderately Integrated (03/14/2021)   Social Connection and Isolation Panel [NHANES]    Frequency of Communication with Friends and Family: More than three times a week    Frequency of Social Gatherings with Friends and Family: Once a week    Attends Religious Services: 1 to 4 times per year    Active Member of [employer] or Organizations: No    Attends Banker Meetings: Never    Marital Status: Married  Catering manager Violence: Not At Risk (03/14/2021)   Humiliation, Afraid, Rape, and Kick questionnaire    Fear of Current or Ex-Partner: No    Emotionally Abused: No    Physically Abused: No    Sexually Abused: No    FAMILY HISTORY:  Family History  Problem Relation Age of Onset   Cancer Paternal Grandfather        thyroid   Cancer Paternal Grandmother        ovarian   Diabetes Maternal Grandmother    Hypertension Maternal Grandmother    Heart disease Maternal Grandmother    Hypertension Mother    Diabetes Mother    Diabetes Brother     CURRENT MEDICATIONS:  Outpatient Encounter Medications as of 01/15/2022  Medication Sig Note   acetaminophen (TYLENOL) 325 MG tablet Take 2 tablets (650 mg total) by mouth every 6 (six) hours as needed for moderate pain or mild pain.    benzoyl peroxide-erythromycin (BENZAMYCIN) gel Apply topically 2 (two) times daily.    ibuprofen  (ADVIL) 600 MG tablet Take 1 tablet (600 mg total) by mouth every 6 (six) hours as needed.    levothyroxine (SYNTHROID) 175 MCG tablet Take 1 tablet (175 mcg total) by mouth daily.    nystatin (MYCOSTATIN/NYSTOP) powder Apply 1 application. topically 3 (three) times daily. 07/25/2021: As needed   phentermine (ADIPEX-P) 37.5 MG tablet Take 1 tablet (37.5 mg total) by mouth daily before breakfast. (Patient not taking: Reported on 12/18/2021)    tretinoin (RETIN-A) 0.025 % cream Apply topically at bedtime.    No facility-administered encounter medications on file as of 01/15/2022.    ALLERGIES:  Allergies  Allergen Reactions   Levaquin [Levofloxacin In D5w] Anaphylaxis   Levofloxacin Anaphylaxis   Pertussis Vaccines Swelling    T-Dap  injection caused localized swelling of arm. Has had Tetanus before without problems.   Megace [Megestrol] Other (See Comments)    Cause Depression   Tetanus-Diphth-Acell Pertussis Swelling   Venofer [Iron Sucrose] Other (See Comments)    Chest Pain     PHYSICAL EXAM:  ECOG PERFORMANCE STATUS: 1 - Symptomatic but completely ambulatory  There were no vitals filed for this visit. There were no vitals filed for this visit. Physical Exam Constitutional:      Appearance: Normal appearance. She is obese.  HENT:     Head: Normocephalic and atraumatic.     Mouth/Throat:     Mouth: Mucous membranes are moist.  Eyes:     Extraocular Movements: Extraocular movements intact.     Pupils: Pupils are equal, round, and reactive to light.  Cardiovascular:     Rate and Rhythm: Normal rate and regular rhythm.     Pulses: Normal pulses.     Heart sounds: Normal heart sounds.  Pulmonary:     Effort: Pulmonary effort is normal.     Breath sounds: Normal breath sounds.  Abdominal:     General: Bowel sounds are normal.     Palpations: Abdomen is soft.     Tenderness: There is no abdominal tenderness.  Musculoskeletal:        General: No swelling.     Right lower  leg: No edema.     Left lower leg: No edema.  Lymphadenopathy:     Cervical: No cervical adenopathy.  Skin:    General: Skin is warm and dry.  Neurological:     General: No focal deficit present.     Mental Status: She is alert and oriented to person, place, and time.  Psychiatric:        Mood and Affect: Mood normal.        Behavior: Behavior normal.      LABORATORY DATA:  I have reviewed the labs as listed.  CBC    Component Value Date/Time   WBC 11.6 (H) 07/17/2021 0838   RBC 5.08 07/17/2021 0838   HGB 14.8 07/17/2021 0838   HGB 12.4 11/05/2020 0907   HCT 44.1 07/17/2021 0838   HCT 39.5 11/05/2020 0907   PLT 339 07/17/2021 0838   PLT 355 11/05/2020 0907   MCV 86.8 07/17/2021 0838   MCV 74 (L) 11/05/2020 0907   MCH 29.1 07/17/2021 0838   MCHC 33.6 07/17/2021 0838   RDW 13.0 07/17/2021 0838   RDW 20.3 (H) 11/05/2020 0907   LYMPHSABS 2.9 07/17/2021 0838   LYMPHSABS 3.3 (H) 11/05/2020 0907   MONOABS 0.7 07/17/2021 0838   EOSABS 0.1 07/17/2021 0838   EOSABS 0.1 11/05/2020 0907   BASOSABS 0.1 07/17/2021 0838   BASOSABS 0.1 11/05/2020 0907      Latest Ref Rng & Units 06/28/2021    7:34 PM 05/09/2021    4:04 PM 11/05/2020    9:07 AM  CMP  Glucose 70 - 99 mg/dL 098  97  81   BUN 6 - 20 mg/dL 9  9  7    Creatinine 0.44 - 1.00 mg/dL  1.19  1.47   Sodium 135 - 145 mmol/L 137  139  138   Potassium 3.5 - 5.1 mmol/L 4.1  4.3  4.2   Chloride 98 - 111 mmol/L 107  105  103   CO2 22 - 32 mmol/L 24  25  20    Calcium 8.9 - 10.3 mg/dL 8.9  8.9  9.5   Total  Protein 6.5 - 8.1 g/dL 8.0   7.0   Total Bilirubin 0.3 - 1.2 mg/dL 0.6   0.2   Alkaline Phos 38 - 126 U/L 68   101   AST 15 - 41 U/L 22   17   ALT 0 - 44 U/L 33   22     DIAGNOSTIC IMAGING:  I have independently reviewed the relevant imaging and discussed with the patient.  ASSESSMENT & PLAN: 1.  Symptomatic iron deficiency without anemia, secondary to chronic blood loss from menorrhagia - She has history of very  heavy menstrual cycles, but underwent hysterectomy on 05/13/2021 - No gross GI hemorrhage - denies bright red blood per rectum and melena     - Takes daily ferrous sulfate, but without improvement in her iron levels - HYPERSENSITIVITY REACTION TO VENOFER: Venofer on 03/10/2021 but experienced significant chest pain during infusion despite premedications with IV steroids.  Subsequent infusions were [facility] along with continued steroid premedication, which she has been tolerating well. - Most recent Feraheme on 07/25/2021 and 08/01/2021. - Labs today (01/15/2022): Hgb 15.1, WBC 12.0/ANC 8.0.  Ferritin 197, iron saturation 30%.  (Suspect mild erythrocytosis and leukocytosis to be secondary to smoking further investigation warranted at this time) - PLAN: No indication for IV iron at this time - Continue daily iron supplementation. - Patient's iron deficiency anemia has resolved now that the source of her blood loss has been treated (menorrhagia resolved s/p hysterectomy) - Tentative discharge from clinic for PCP follow-up, but she can return to us on an as-needed basis in the future   2.  Other history - PMH: Hypothyroidism/Graves' disease with radioablation of thyroid on 11/15/2020; hypertension and SVT during pregnancy - SOCIAL: Lives with her husband and 3 children.  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 - SUBSTANCE: Smoked intermittently since age 36, currently smokes less than 0.5 packs/day.  Rare social alcohol consumption.  No history of illicit drug use. - FAMILY: Paternal grandmother with ovarian cancer.  Paternal grandfather with thyroid cancer.  Maternal grandmother with unspecified anemia.    PLAN SUMMARY: >> Tentative discharge from clinic for PCP follow-up, but she can return to us on an as-needed basis in the future   All questions were answered. The patient knows to call the clinic with any problems, questions or concerns.  Medical decision making: Low  Time spent on visit: I  spent 15 minutes counseling the patient face to face. The total time spent in the appointment was 22 minutes and more than 50% was on counseling.   Carnella GuadalajaraRebekah M Yuette Putnam, PA-C  01/15/2022 1:30 PM

## 2022-01-15 ENCOUNTER — Inpatient Hospital Stay: Payer: Commercial Managed Care - PPO | Attending: Physician Assistant

## 2022-01-15 ENCOUNTER — Inpatient Hospital Stay (HOSPITAL_BASED_OUTPATIENT_CLINIC_OR_DEPARTMENT_OTHER): Payer: Commercial Managed Care - PPO | Admitting: Physician Assistant

## 2022-01-15 ENCOUNTER — Encounter: Payer: Self-pay | Admitting: Physician Assistant

## 2022-01-15 VITALS — BP 133/86 | HR 89 | Temp 98.6°F | Resp 16 | Wt 234.0 lb

## 2022-01-15 DIAGNOSIS — Z833 Family history of diabetes mellitus: Secondary | ICD-10-CM | POA: Diagnosis not present

## 2022-01-15 DIAGNOSIS — D500 Iron deficiency anemia secondary to blood loss (chronic): Secondary | ICD-10-CM | POA: Diagnosis not present

## 2022-01-15 DIAGNOSIS — Z8616 Personal history of COVID-19: Secondary | ICD-10-CM | POA: Insufficient documentation

## 2022-01-15 DIAGNOSIS — I10 Essential (primary) hypertension: Secondary | ICD-10-CM | POA: Diagnosis not present

## 2022-01-15 DIAGNOSIS — Z881 Allergy status to other antibiotic agents status: Secondary | ICD-10-CM | POA: Insufficient documentation

## 2022-01-15 DIAGNOSIS — R5383 Other fatigue: Secondary | ICD-10-CM | POA: Insufficient documentation

## 2022-01-15 DIAGNOSIS — Z79890 Hormone replacement therapy (postmenopausal): Secondary | ICD-10-CM | POA: Diagnosis not present

## 2022-01-15 DIAGNOSIS — Z808 Family history of malignant neoplasm of other organs or systems: Secondary | ICD-10-CM | POA: Insufficient documentation

## 2022-01-15 DIAGNOSIS — Z8249 Family history of ischemic heart disease and other diseases of the circulatory system: Secondary | ICD-10-CM | POA: Diagnosis not present

## 2022-01-15 DIAGNOSIS — Z8041 Family history of malignant neoplasm of ovary: Secondary | ICD-10-CM | POA: Diagnosis not present

## 2022-01-15 DIAGNOSIS — F17210 Nicotine dependence, cigarettes, uncomplicated: Secondary | ICD-10-CM | POA: Diagnosis not present

## 2022-01-15 DIAGNOSIS — Z79899 Other long term (current) drug therapy: Secondary | ICD-10-CM | POA: Diagnosis not present

## 2022-01-15 DIAGNOSIS — D509 Iron deficiency anemia, unspecified: Secondary | ICD-10-CM | POA: Insufficient documentation

## 2022-01-15 DIAGNOSIS — Z887 Allergy status to serum and vaccine status: Secondary | ICD-10-CM | POA: Insufficient documentation

## 2022-01-15 DIAGNOSIS — Z90710 Acquired absence of both cervix and uterus: Secondary | ICD-10-CM | POA: Diagnosis not present

## 2022-01-15 DIAGNOSIS — R519 Headache, unspecified: Secondary | ICD-10-CM | POA: Insufficient documentation

## 2022-01-15 LAB — CBC WITH DIFFERENTIAL/PLATELET
Abs Immature Granulocytes: 0.05 10*3/uL (ref 0.00–0.07)
Basophils Absolute: 0.1 10*3/uL (ref 0.0–0.1)
Basophils Relative: 1 %
Eosinophils Absolute: 0.2 10*3/uL (ref 0.0–0.5)
Eosinophils Relative: 1 %
HCT: 44.5 % (ref 36.0–46.0)
Hemoglobin: 15.1 g/dL — ABNORMAL HIGH (ref 12.0–15.0)
Immature Granulocytes: 0 %
Lymphocytes Relative: 27 %
Lymphs Abs: 3.2 10*3/uL (ref 0.7–4.0)
MCH: 30.2 pg (ref 26.0–34.0)
MCHC: 33.9 g/dL (ref 30.0–36.0)
MCV: 89 fL (ref 80.0–100.0)
Monocytes Absolute: 0.6 10*3/uL (ref 0.1–1.0)
Monocytes Relative: 5 %
Neutro Abs: 8 10*3/uL — ABNORMAL HIGH (ref 1.7–7.7)
Neutrophils Relative %: 66 %
Platelets: 285 10*3/uL (ref 150–400)
RBC: 5 MIL/uL (ref 3.87–5.11)
RDW: 12.3 % (ref 11.5–15.5)
WBC: 12 10*3/uL — ABNORMAL HIGH (ref 4.0–10.5)
nRBC: 0 % (ref 0.0–0.2)

## 2022-01-15 LAB — IRON AND TIBC
Iron: 92 ug/dL (ref 28–170)
Saturation Ratios: 30 % (ref 10.4–31.8)
TIBC: 305 ug/dL (ref 250–450)
UIBC: 213 ug/dL

## 2022-01-15 LAB — FERRITIN: Ferritin: 197 ng/mL (ref 11–307)

## 2022-01-15 NOTE — Patient Instructions (Signed)
[facility] at [facility] **VISIT SUMMARY & IMPORTANT INSTRUCTIONS **   You were seen today by Rojelio Brenner PA-C for your iron deficiency anemia.   Your blood and iron levels look fantastic today! Now that you have had a hysterectomy, your iron deficiency anemia seems to have resolved. We will tentatively discharge you from the hematology clinic, but you should continue to follow-up with your primary care doctor for lab work as needed.  If you need consultation for possible IV iron in the future, we would be happy to see you again on an as-needed basis.   ** Thank you for trusting me with your healthcare!  I strive to provide all of my patients with quality care at each visit.  If you receive a survey for this visit, I would be so grateful to you for taking the time to provide feedback.  Thank you in advance!  ~ Saquan Furtick                   Dr. Doreatha Massed   &   Rojelio Brenner, PA-C   - - - - - - - - - - - - - - - - - -    Thank you for choosing [facility] at [facility] to provide your oncology and hematology care.  To afford each patient quality time with our provider, please arrive at least 15 minutes before your scheduled appointment time.   If you have a lab appointment with the Cancer Center please come in thru the Main Entrance and check in at the main information desk.  You need to re-schedule your appointment should you arrive 10 or more minutes late.  We strive to give you quality time with our providers, and arriving late affects you and other patients whose appointments are after yours.  Also, if you no show three or more times for appointments you may be dismissed from the clinic at the providers discretion.     Again, thank you for choosing [facility].  Our hope is that these requests will decrease the amount of time that you wait before being seen by our physicians.        _____________________________________________________________  Should you have questions after your visit to [facility], please contact our office at [phone] and follow the prompts.  Our office hours are 8:00 a.m. and 4:30 p.m. Monday - Friday.  Please note that voicemails left after 4:00 p.m. may not be returned until the following business day.  We are closed weekends and major holidays.  You do have access to a nurse 24-7, just call the main number to the clinic [phone] and do not press any options, hold on the line and a nurse will answer the phone.    For prescription refill requests, have your pharmacy contact our office and allow 72 hours.

## 2022-01-28 ENCOUNTER — Other Ambulatory Visit: Payer: Self-pay | Admitting: Family Medicine

## 2022-01-28 ENCOUNTER — Other Ambulatory Visit: Payer: Self-pay

## 2022-01-28 DIAGNOSIS — L700 Acne vulgaris: Secondary | ICD-10-CM

## 2022-01-28 MED ORDER — BENZOYL PEROXIDE-ERYTHROMYCIN 5-3 % EX GEL: Freq: Two times a day (BID) | CUTANEOUS | 0 refills | Status: DC

## 2022-01-28 NOTE — Telephone Encounter (Signed)
Caller & Relationship to patient:  MRN #  [id number]   Call Back Number:   Date of Last Office Visit: Visit date not found     Date of Next Office Visit: Visit date not found    Medication(s) to be Refilled: phentermine  Preferred Pharmacy: [facility]  ** Please notify patient to allow 48-72 hours to process** **Let patient know to contact pharmacy at the end of the day to make sure medication is ready. ** **If patient has not been seen in a year or longer, book an appointment **Advise to use MyChart for refill requests OR to contact their pharmacy

## 2022-01-28 NOTE — Telephone Encounter (Signed)
From: [name] To: Office of Donell Beers, [place] Sent: 01/28/2022 12:59 AM EST Subject: Medication Renewal Request  Refills have been requested for the following medications:   phentermine (ADIPEX-P) 37.5 MG tablet [[facility]]  Preferred pharmacy: [facility] [phone] - [place], [place] - [address] AT [facility] [address] Delivery method: Pickup   Medication renewals requested in this message routed separately:   benzoyl peroxide-erythromycin (BENZAMYCIN) gel [Gloria Zarwolo]

## 2022-01-30 NOTE — Telephone Encounter (Signed)
Please advise if phentermine will be sent in thanks [facility]

## 2022-02-02 NOTE — Telephone Encounter (Signed)
Please encourage the patient to  schedule a tele or office visit for meds refill.

## 2022-02-04 ENCOUNTER — Encounter: Payer: Self-pay | Admitting: Family Medicine

## 2022-02-08 ENCOUNTER — Other Ambulatory Visit: Payer: Self-pay | Admitting: Family Medicine

## 2022-02-08 MED ORDER — PHENTERMINE HCL 37.5 MG PO TABS: 37.5000 mg | ORAL_TABLET | Freq: Every day | ORAL | 0 refills | Status: DC

## 2022-02-25 ENCOUNTER — Other Ambulatory Visit: Payer: Self-pay | Admitting: Nurse Practitioner

## 2022-03-10 ENCOUNTER — Other Ambulatory Visit: Payer: Self-pay | Admitting: Family Medicine

## 2022-03-11 ENCOUNTER — Other Ambulatory Visit: Payer: Self-pay | Admitting: Family Medicine

## 2022-03-11 MED ORDER — PHENTERMINE HCL 37.5 MG PO TABS: 37.5000 mg | ORAL_TABLET | Freq: Every day | ORAL | 0 refills | Status: DC

## 2022-03-11 NOTE — Telephone Encounter (Signed)
Rx sent 

## 2022-03-12 ENCOUNTER — Other Ambulatory Visit: Payer: Self-pay

## 2022-03-13 ENCOUNTER — Other Ambulatory Visit: Payer: Self-pay

## 2022-03-20 LAB — T4, FREE: Free T4: 1.37 ng/dL (ref 0.82–1.77)

## 2022-03-20 LAB — TSH: TSH: 6.94 u[IU]/mL — ABNORMAL HIGH (ref 0.450–4.500)

## 2022-03-23 ENCOUNTER — Ambulatory Visit: Payer: Commercial Managed Care - PPO | Admitting: Family Medicine

## 2022-03-23 ENCOUNTER — Encounter: Payer: Self-pay | Admitting: Family Medicine

## 2022-04-02 NOTE — Patient Instructions (Signed)

## 2022-04-06 ENCOUNTER — Encounter: Payer: Self-pay | Admitting: Nurse Practitioner

## 2022-04-06 ENCOUNTER — Ambulatory Visit (INDEPENDENT_AMBULATORY_CARE_PROVIDER_SITE_OTHER): Payer: Commercial Managed Care - PPO | Admitting: Nurse Practitioner

## 2022-04-06 ENCOUNTER — Ambulatory Visit: Payer: Commercial Managed Care - PPO | Admitting: Nutrition

## 2022-04-06 VITALS — BP 133/84 | HR 84 | Ht 65.0 in | Wt 247.2 lb

## 2022-04-06 DIAGNOSIS — E890 Postprocedural hypothyroidism: Secondary | ICD-10-CM | POA: Diagnosis not present

## 2022-04-06 MED ORDER — LEVOTHYROXINE SODIUM 88 MCG PO TABS: 88.0000 ug | ORAL_TABLET | Freq: Every day | ORAL | 1 refills | Status: DC

## 2022-04-06 MED ORDER — LEVOTHYROXINE SODIUM 100 MCG PO TABS: 100.0000 ug | ORAL_TABLET | Freq: Every day | ORAL | 1 refills | Status: DC

## 2022-04-06 NOTE — Progress Notes (Signed)
04/06/2022     Endocrinology Follow Up Note    Subjective:    Patient ID: Paula Myers, female    DOB: 10/30/1985, PCP Alvira Monday, FNP.   Past Medical History:  Diagnosis Date   Anemia    prior pregnancies   Anxiety    Depression    Dysrhythmia    "skipping" HR at beginning of pregnancy   H/O cervical polypectomy    Heartburn during pregnancy    History of cesarean section 08/22/2014   Hypertension 2005   while in labor   Hyperthyroidism    Iron deficiency anemia due to chronic blood loss 11/21/2020   Kidney stone    Palpitation    SVT (supraventricular tachycardia)    SVT (supraventricular tachycardia)    Tachycardia     Past Surgical History:  Procedure Laterality Date   ABDOMINAL HYSTERECTOMY N/A 05/13/2021   Procedure: HYSTERECTOMY ABDOMINAL;  Surgeon: Janyth Pupa, DO;  Location: [facility];  Service: Gynecology;  Laterality: N/A;   CESAREAN SECTION     06/11/09; 05/25/2003   CESAREAN SECTION WITH BILATERAL TUBAL LIGATION Bilateral 08/22/2014   Procedure: CESAREAN SECTION WITH BILATERAL TUBAL LIGATION;  Surgeon: Sanjuana Kava, MD;  Location: [facility];  Service: Obstetrics;  Laterality: Bilateral;   DILATION AND CURETTAGE OF UTERUS  01/2008   ELBOW SURGERY     Left    Social History   Socioeconomic History   Marital status: Married    Spouse name: Not on file   Number of children: 3   Years of education: Not on file   Highest education level: Not on file  Occupational History   Occupation: [employer]    Comment: Dispatcher  Tobacco Use   Smoking status: Every Day    Packs/day: 0.25    Years: 20.00    Total pack years: 5.00    Types: Cigarettes   Smokeless tobacco: Never  Vaping Use   Vaping Use: Former  Substance and Sexual Activity   Alcohol use: Not Currently    Comment: maybe 1-2 per year   Drug use: No   Sexual activity: Yes    Birth control/protection: Surgical    Comment: hyst  Other Topics Concern   Not on  file  Social History Narrative   Not on file   Social Determinants of Health   Financial Resource Strain: Low Risk  (03/14/2021)   Overall Financial Resource Strain (CARDIA)    Difficulty of Paying Living Expenses: Not very hard  Food Insecurity: No Food Insecurity (03/14/2021)   Hunger Vital Sign    Worried About Running Out of Food in the Last Year: Never true    Ran Out of Food in the Last Year: Never true  Transportation Needs: No Transportation Needs (03/14/2021)   PRAPARE - Hydrologist (Medical): No    Lack of Transportation (Non-Medical): No  Physical Activity: Insufficiently Active (03/14/2021)   Exercise Vital Sign    Days of Exercise per Week: 3 days    Minutes of Exercise per Session: 20 min  Stress: No Stress Concern Present (03/14/2021)   [place]    Feeling of Stress : Only a little  Social Connections: Moderately Integrated (03/14/2021)   Social Connection and Isolation Panel [NHANES]    Frequency of Communication with Friends and Family: More than three times a week    Frequency of Social Gatherings with Friends and Family: Once a  week    Attends Religious Services: 1 to 4 times per year    Active Member of Clubs or Organizations: No    Attends Archivist Meetings: Never    Marital Status: Married    Family History  Problem Relation Age of Onset   Cancer Paternal Grandfather        thyroid   Cancer Paternal Grandmother        ovarian   Diabetes Maternal Grandmother    Hypertension Maternal Grandmother    Heart disease Maternal Grandmother    Hypertension Mother    Diabetes Mother    Diabetes Brother     Outpatient Encounter Medications as of 04/06/2022  Medication Sig   acetaminophen (TYLENOL) 325 MG tablet Take 2 tablets (650 mg total) by mouth every 6 (six) hours as needed for moderate pain or mild pain.   benzoyl peroxide-erythromycin (BENZAMYCIN)  gel Apply topically 2 (two) times daily.   ibuprofen (ADVIL) 600 MG tablet Take 1 tablet (600 mg total) by mouth every 6 (six) hours as needed.   levothyroxine (SYNTHROID) 88 MCG tablet Take 1 tablet (88 mcg total) by mouth daily.   nystatin (MYCOSTATIN/NYSTOP) powder Apply 1 application. topically 3 (three) times daily.   phentermine (ADIPEX-P) 37.5 MG tablet Take 1 tablet (37.5 mg total) by mouth daily before breakfast.   tretinoin (RETIN-A) 0.025 % cream Apply topically at bedtime.   [DISCONTINUED] levothyroxine (SYNTHROID) 175 MCG tablet TAKE 1 TABLET(175 MCG) BY MOUTH DAILY BEFORE BREAKFAST   levothyroxine (SYNTHROID) 100 MCG tablet Take 1 tablet (100 mcg total) by mouth daily before breakfast.   No facility-administered encounter medications on file as of 04/06/2022.    ALLERGIES: Allergies  Allergen Reactions   Levaquin [Levofloxacin In D5w] Anaphylaxis   Levofloxacin Anaphylaxis   Pertussis Vaccines Swelling    T-Dap injection caused localized swelling of arm. Has had Tetanus before without problems.   Megace [Megestrol] Other (See Comments)    Cause Depression   Tetanus-Diphth-Acell Pertussis Swelling   Venofer [Iron Sucrose] Other (See Comments)    Chest Pain    VACCINATION STATUS: Immunization History  Administered Date(s) Administered   DTaP 09/09/1983, 11/18/1983, 02/03/1984, 01/25/1985, 10/29/1988   Hepatitis B 12/09/1994, 01/14/1995, 06/16/1995   IPV 09/09/1983, 11/18/1983, 02/03/1984, 01/25/1985   Influenza,inj,Quad PF,6+ Mos 11/07/2020, 12/18/2021   Influenza-Unspecified 12/18/2002, 01/23/2014   MMR 10/28/1984, 12/26/2004   Moderna Sars-Covid-2 Vaccination 12/19/2019, 01/16/2020   Tdap 12/26/2004     HPI  Paula Myers is 37 y.o. female who presents today with a medical history as above. she is being seen in follow up after being seen in consultation for hyperthyroidism requested by Alvira Monday, [place].   she denies dysphagia, choking, shortness of  breath, no recent voice change.    she does family history of thyroid dysfunction in her grandmother (Graves disease) and mother (MNG requiring thyroidectomy), but denies family hx of thyroid cancer. she denies personal history of goiter. she is not on any anti-thyroid medications nor on any thyroid hormone supplements. She does intermittently use of Biotin containing supplements but has not taken it in a few weeks.   She underwent RAI ablation for Graves disease on 11/15/20 and has since been started on thyroid hormone replacement.  She notes she did miss 2 days of her levothyroxine when she was out of town about a month ago because she forgot her pills at home.   Review of systems  Constitutional: + steadily increasing body weight,  current Body mass index  is 41.14 kg/m. , + fatigue, no subjective hyperthermia, no subjective hypothermia Eyes: + blurry vision- dry eyes, no xerophthalmia ENT: no sore throat, no nodules palpated in throat, no dysphagia/odynophagia, no hoarseness Cardiovascular: no chest pain, no shortness of breath, no palpitations, no leg swelling Respiratory: no cough, no shortness of breath Gastrointestinal: no nausea/vomiting/diarrhea Musculoskeletal: no muscle/joint aches Skin: no rashes, no hyperemia Neurological: no tremors, no numbness, no tingling, no dizziness Psychiatric: no depression, no anxiety   Objective:    BP 133/84 (BP Location: Right Arm, Patient Position: Sitting, Cuff Size: Large)   Pulse 84   Ht 5\' 5"  (1.651 m)   Wt 247 lb 3.2 oz (112.1 kg)   LMP 04/30/2021 Comment: Negative HCG on 05/09/2021  BMI 41.14 kg/m   Wt Readings from Last 3 Encounters:  04/06/22 247 lb 3.2 oz (112.1 kg)  01/15/22 234 lb (106.1 kg)  12/18/21 233 lb 1.9 oz (105.7 kg)     BP Readings from Last 3 Encounters:  04/06/22 133/84  01/15/22 133/86  12/18/21 128/84                        Physical Exam- Limited  Constitutional:  Body mass index is 41.14 kg/m. , not in  acute distress, normal state of mind Eyes:  EOMI, no exophthalmos Musculoskeletal: no gross deformities, strength intact in all four extremities, no gross restriction of joint movements Skin:  no rashes, no hyperemia Neurological: no tremor with outstretched hands   CMP     Component Value Date/Time   NA 137 06/28/2021 1934   NA 138 11/05/2020 0907   K 4.1 06/28/2021 1934   CL 107 06/28/2021 1934   CO2 24 06/28/2021 1934   GLUCOSE 112 (H) 06/28/2021 1934   BUN 9 06/28/2021 1934   BUN 7 11/05/2020 0907   CREATININE 0.74 06/28/2021 1934   CALCIUM 8.9 06/28/2021 1934   PROT 8.0 06/28/2021 1934   PROT 7.0 11/05/2020 0907   ALBUMIN 4.3 06/28/2021 1934   ALBUMIN 4.3 11/05/2020 0907   AST 22 06/28/2021 1934   ALT 33 06/28/2021 1934   ALKPHOS 68 06/28/2021 1934   BILITOT 0.6 06/28/2021 1934   BILITOT 0.2 11/05/2020 0907   GFRNONAA >60 06/28/2021 1934   GFRAA >60 09/22/2019 1938     CBC    Component Value Date/Time   WBC 12.0 (H) 01/15/2022 1150   RBC 5.00 01/15/2022 1150   HGB 15.1 (H) 01/15/2022 1150   HGB 12.4 11/05/2020 0907   HCT 44.5 01/15/2022 1150   HCT 39.5 11/05/2020 0907   PLT 285 01/15/2022 1150   PLT 355 11/05/2020 0907   MCV 89.0 01/15/2022 1150   MCV 74 (L) 11/05/2020 0907   MCH 30.2 01/15/2022 1150   MCHC 33.9 01/15/2022 1150   RDW 12.3 01/15/2022 1150   RDW 20.3 (H) 11/05/2020 0907   LYMPHSABS 3.2 01/15/2022 1150   LYMPHSABS 3.3 (H) 11/05/2020 0907   MONOABS 0.6 01/15/2022 1150   EOSABS 0.2 01/15/2022 1150   EOSABS 0.1 11/05/2020 0907   BASOSABS 0.1 01/15/2022 1150   BASOSABS 0.1 11/05/2020 0907     Diabetic Labs (most recent): No results found for: "HGBA1C", "MICROALBUR"  Lipid Panel     Component Value Date/Time   CHOL 178 11/05/2020 0907   TRIG 101 11/05/2020 0907   HDL 46 11/05/2020 0907   LDLCALC 114 (H) 11/05/2020 0907   LABVLDL 18 11/05/2020 0907     Lab Results  Component Value  Date   TSH 6.940 (H) 03/19/2022   TSH 3.980  12/01/2021   TSH 4.890 (H) 08/05/2021   TSH 18.700 (H) 05/29/2021   TSH 19.900 (H) 03/31/2021   TSH 47.300 (H) 02/04/2021   TSH <0.005 (L) 12/31/2020   TSH <0.005 (L) 10/07/2020   FREET4 1.37 03/19/2022   FREET4 1.63 12/01/2021   FREET4 1.30 08/05/2021   FREET4 0.88 05/29/2021   FREET4 0.95 03/31/2021   FREET4 0.13 (L) 02/04/2021   FREET4 1.28 12/31/2020   FREET4 1.84 (H) 10/07/2020     Uptake and Scan from 10/17/20 CLINICAL DATA:  Hyperthyroidism, TSH 0.450   EXAM: THYROID SCAN AND UPTAKE - 4 AND 24 HOURS   TECHNIQUE: Following oral administration of I-123 capsule, anterior planar imaging was acquired at 24 hours. Thyroid uptake was calculated with a thyroid probe at 4-6 hours and 24 hours.   RADIOPHARMACEUTICALS:  436 uCi I-123 sodium iodide p.o.   COMPARISON:  None   FINDINGS: Homogeneous tracer distribution in both thyroid lobes.   No focal areas of increased or decreased tracer localization.   4 hour I-123 uptake = 14.1% (normal 5-20%)   24 hour I-123 uptake = 31.1% (normal 10-30%)   IMPRESSION: Normal thyroid scan.   Minimally elevated 24 hour radio iodine uptake.   In the setting of hyperthyroidism, findings suggest Graves disease     Electronically Signed   By: Lavonia Dana M.D.   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  -She underwent RAI ablation on 11/15/20.    Her previsit thyroid function tests are consistent with slight under-replacement.  She is advised to increase her Levothyroxine to 188 mcg po daily before breakfast (will need to take 2 pills, a 100 mcg pill and  an 88 mcg pill together to equal her dosage).    - The correct intake of thyroid hormone (Levothyroxine, Synthroid), is on empty stomach first thing in the morning, with water, separated by at least 30 minutes from breakfast and other medications,  and separated by more than 4 hours from calcium, iron, multivitamins, acid reflux medications (PPIs).  - This medication is a life-long medication and will be needed to correct thyroid hormone imbalances for the rest of your life.  The dose may change from time to time, based on thyroid blood work.  - It is extremely important to be consistent taking this medication, near the same time each morning.  -AVOID TAKING PRODUCTS CONTAINING BIOTIN (commonly found in Hair, Skin, Nails vitamins) AS IT INTERFERES WITH THE VALIDITY OF THYROID FUNCTION BLOOD TESTS.    Will repeat again in 3 months for continued surveillance so dosage adjustments can be made if necessary.       -Patient is advised to maintain close follow up with Alvira Monday, FNP for primary care needs.    I spent  23  minutes in the care of the patient today including review of labs from Thyroid Function, CMP, and other relevant labs ; imaging/biopsy records (current and previous including abstractions from other facilities); face-to-face time discussing  her lab results and symptoms, medications  doses, her options of short and long term treatment based on the latest standards of care / guidelines;   and documenting the encounter.  Collie Siad Barbaro  participated in the discussions, expressed understanding, and voiced agreement with the above plans.  All questions were answered to her satisfaction. she is encouraged to contact clinic should she have any questions or concerns prior to her return visit.    Follow up plan: Return in about 3 months (around 07/05/2022) for Thyroid follow up, Previsit labs.   Thank you for involving me in the care of this pleasant patient, and I will  continue to update you with her progress.   Ronny Bacon, [facility] [address] [place], [place] [address] Phone: [phone] Fax: [phone]  04/06/2022, 9:27 AM

## 2022-04-21 ENCOUNTER — Other Ambulatory Visit: Payer: Self-pay | Admitting: Family Medicine

## 2022-04-27 ENCOUNTER — Other Ambulatory Visit: Payer: Self-pay | Admitting: Family Medicine

## 2022-05-11 ENCOUNTER — Encounter (HOSPITAL_COMMUNITY): Payer: Self-pay | Admitting: Hematology

## 2022-05-11 ENCOUNTER — Encounter: Payer: Self-pay | Admitting: Family Medicine

## 2022-05-11 ENCOUNTER — Other Ambulatory Visit: Payer: Self-pay | Admitting: Family Medicine

## 2022-05-11 ENCOUNTER — Ambulatory Visit (INDEPENDENT_AMBULATORY_CARE_PROVIDER_SITE_OTHER): Payer: Commercial Managed Care - PPO | Admitting: Family Medicine

## 2022-05-11 VITALS — BP 142/86 | HR 87 | Ht 65.0 in | Wt 252.0 lb

## 2022-05-11 DIAGNOSIS — F410 Panic disorder [episodic paroxysmal anxiety] without agoraphobia: Secondary | ICD-10-CM | POA: Insufficient documentation

## 2022-05-11 DIAGNOSIS — E0590 Thyrotoxicosis, unspecified without thyrotoxic crisis or storm: Secondary | ICD-10-CM | POA: Diagnosis not present

## 2022-05-11 DIAGNOSIS — I10 Essential (primary) hypertension: Secondary | ICD-10-CM

## 2022-05-11 DIAGNOSIS — E039 Hypothyroidism, unspecified: Secondary | ICD-10-CM | POA: Insufficient documentation

## 2022-05-11 DIAGNOSIS — E559 Vitamin D deficiency, unspecified: Secondary | ICD-10-CM

## 2022-05-11 DIAGNOSIS — R7301 Impaired fasting glucose: Secondary | ICD-10-CM

## 2022-05-11 DIAGNOSIS — E7849 Other hyperlipidemia: Secondary | ICD-10-CM

## 2022-05-11 DIAGNOSIS — Z720 Tobacco use: Secondary | ICD-10-CM

## 2022-05-11 DIAGNOSIS — F411 Generalized anxiety disorder: Secondary | ICD-10-CM | POA: Insufficient documentation

## 2022-05-11 MED ORDER — HYDROCHLOROTHIAZIDE 12.5 MG PO TABS: 12.5000 mg | ORAL_TABLET | Freq: Every day | ORAL | 1 refills | Status: DC

## 2022-05-11 MED ORDER — PHENTERMINE HCL 37.5 MG PO TABS: 37.5000 mg | ORAL_TABLET | Freq: Every day | ORAL | 0 refills | Status: DC

## 2022-05-11 MED ORDER — FLUOXETINE HCL 10 MG PO TABS: 10.0000 mg | ORAL_TABLET | Freq: Every day | ORAL | 3 refills | Status: DC

## 2022-05-11 NOTE — Progress Notes (Signed)
Established Patient Office Visit  Subjective:  Patient ID: Paula Myers, female    DOB: 1985/10/09  Age: 37 y.o. MRN: [id number]  CC:  Chief Complaint  Patient presents with   Follow-up    Pt following up, needs a refill on phentermine has not been filled since dec/jan. Also been dealing with panic attacks with the anxiety.     HPI Paula Myers is a 37 y.o. female with past medical history of hypertension, hypothyroidism, obesity, and tobacco use presents for f/u of  chronic medical conditions.    Past Medical History:  Diagnosis Date   Anemia    prior pregnancies   Anxiety    Depression    Dysrhythmia    "skipping" HR at beginning of pregnancy   H/O cervical polypectomy    Heartburn during pregnancy    History of cesarean section 08/22/2014   Hypertension 2005   while in labor   Hyperthyroidism    Iron deficiency anemia due to chronic blood loss 11/21/2020   Kidney stone    Palpitation    SVT (supraventricular tachycardia)    SVT (supraventricular tachycardia)    Tachycardia     Past Surgical History:  Procedure Laterality Date   ABDOMINAL HYSTERECTOMY N/A 05/13/2021   Procedure: HYSTERECTOMY ABDOMINAL;  Surgeon: Janyth Pupa, DO;  Location: [facility];  Service: Gynecology;  Laterality: N/A;   CESAREAN SECTION     06/11/09; 05/25/2003   CESAREAN SECTION WITH BILATERAL TUBAL LIGATION Bilateral 08/22/2014   Procedure: CESAREAN SECTION WITH BILATERAL TUBAL LIGATION;  Surgeon: Sanjuana Kava, MD;  Location: [facility];  Service: Obstetrics;  Laterality: Bilateral;   DILATION AND CURETTAGE OF UTERUS  01/2008   ELBOW SURGERY     Left    Family History  Problem Relation Age of Onset   Cancer Paternal Grandfather        thyroid   Cancer Paternal Grandmother        ovarian   Diabetes Maternal Grandmother    Hypertension Maternal Grandmother    Heart disease Maternal Grandmother    Hypertension Mother    Diabetes Mother    Diabetes Brother     Social  History   Socioeconomic History   Marital status: Married    Spouse name: Not on file   Number of children: 3   Years of education: Not on file   Highest education level: Not on file  Occupational History   Occupation: [employer]    Comment: Dispatcher  Tobacco Use   Smoking status: Former    Packs/day: 0.25    Years: 20.00    Total pack years: 5.00    Types: Cigarettes    Quit date: 04/10/2022    Years since quitting: 0.0   Smokeless tobacco: Never   Tobacco comments:    Quit 32 days ago.   Vaping Use   Vaping Use: Former  Substance and Sexual Activity   Alcohol use: Not Currently    Comment: maybe 1-2 per year   Drug use: No   Sexual activity: Yes    Birth control/protection: Surgical    Comment: hyst  Other Topics Concern   Not on file  Social History Narrative   Not on file   Social Determinants of Health   Financial Resource Strain: Low Risk  (03/14/2021)   Overall Financial Resource Strain (CARDIA)    Difficulty of Paying Living Expenses: Not very hard  Food Insecurity: No Food Insecurity (03/14/2021)   Hunger Vital Sign  Worried About Charity fundraiser in the Last Year: Never true    [place] in the Last Year: Never true  Transportation Needs: No Transportation Needs (03/14/2021)   PRAPARE - Hydrologist (Medical): No    Lack of Transportation (Non-Medical): No  Physical Activity: Insufficiently Active (03/14/2021)   Exercise Vital Sign    Days of Exercise per Week: 3 days    Minutes of Exercise per Session: 20 min  Stress: No Stress Concern Present (03/14/2021)   [place]    Feeling of Stress : Only a little  Social Connections: Moderately Integrated (03/14/2021)   Social Connection and Isolation Panel [NHANES]    Frequency of Communication with Friends and Family: More than three times a week    Frequency of Social Gatherings with Friends  and Family: Once a week    Attends Religious Services: 1 to 4 times per year    Active Member of [employer] or Organizations: No    Attends Archivist Meetings: Never    Marital Status: Married  Human resources officer Violence: Not At Risk (03/14/2021)   Humiliation, Afraid, Rape, and Kick questionnaire    Fear of Current or Ex-Partner: No    Emotionally Abused: No    Physically Abused: No    Sexually Abused: No    Outpatient Medications Prior to Visit  Medication Sig Dispense Refill   acetaminophen (TYLENOL) 325 MG tablet Take 2 tablets (650 mg total) by mouth every 6 (six) hours as needed for moderate pain or mild pain.     benzoyl peroxide-erythromycin (BENZAMYCIN) gel Apply topically 2 (two) times daily. 23.3 g 0   ibuprofen (ADVIL) 600 MG tablet Take 1 tablet (600 mg total) by mouth every 6 (six) hours as needed. 30 tablet 0   levothyroxine (SYNTHROID) 100 MCG tablet Take 1 tablet (100 mcg total) by mouth daily before breakfast. 90 tablet 1   levothyroxine (SYNTHROID) 88 MCG tablet Take 1 tablet (88 mcg total) by mouth daily. 90 tablet 1   nystatin (MYCOSTATIN/NYSTOP) powder Apply 1 application. topically 3 (three) times daily. 15 g 0   tretinoin (RETIN-A) 0.025 % cream Apply topically at bedtime. 45 g 0   phentermine (ADIPEX-P) 37.5 MG tablet Take 1 tablet (37.5 mg total) by mouth daily before breakfast. (Patient not taking: Reported on 05/11/2022) 30 tablet 0   No facility-administered medications prior to visit.    Allergies  Allergen Reactions   Levaquin [Levofloxacin In D5w] Anaphylaxis   Levofloxacin Anaphylaxis   Pertussis Vaccines Swelling    T-Dap injection caused localized swelling of arm. Has had Tetanus before without problems.   Megace [Megestrol] Other (See Comments)    Cause Depression   Tetanus-Diphth-Acell Pertussis Swelling   Venofer [Iron Sucrose] Other (See Comments)    Chest Pain    ROS Review of Systems  Constitutional:  Negative for chills and fever.   Eyes:  Negative for visual disturbance.  Respiratory:  Negative for chest tightness and shortness of breath.   Neurological:  Negative for dizziness and headaches.      Objective:    Physical Exam HENT:     Head: Normocephalic.     Mouth/Throat:     Mouth: Mucous membranes are moist.  Cardiovascular:     Rate and Rhythm: Normal rate.     Heart sounds: Normal heart sounds.  Pulmonary:     Effort: Pulmonary effort is normal.  Breath sounds: Normal breath sounds.  Neurological:     Mental Status: She is alert.     BP (!) 142/86 (BP Location: Left Arm)   Pulse 87   Ht '5\' 5"'$  (1.651 m)   Wt 252 lb 0.6 oz (114.3 kg)   LMP 04/30/2021 Comment: Negative HCG on 05/09/2021  SpO2 95%   BMI 41.94 kg/m  Wt Readings from Last 3 Encounters:  05/11/22 252 lb 0.6 oz (114.3 kg)  04/06/22 247 lb 3.2 oz (112.1 kg)  01/15/22 234 lb (106.1 kg)    Lab Results  Component Value Date   TSH 6.940 (H) 03/19/2022   Lab Results  Component Value Date   WBC 12.0 (H) 01/15/2022   HGB 15.1 (H) 01/15/2022   HCT 44.5 01/15/2022   MCV 89.0 01/15/2022   PLT 285 01/15/2022   Lab Results  Component Value Date   NA 137 06/28/2021   K 4.1 06/28/2021   CO2 24 06/28/2021   GLUCOSE 112 (H) 06/28/2021   BUN 9 06/28/2021   CREATININE 0.74 06/28/2021   BILITOT 0.6 06/28/2021   ALKPHOS 68 06/28/2021   AST 22 06/28/2021   ALT 33 06/28/2021   PROT 8.0 06/28/2021   ALBUMIN 4.3 06/28/2021   CALCIUM 8.9 06/28/2021   ANIONGAP 6 06/28/2021   EGFR 116 11/05/2020   GFR 102.38 01/15/2014   Lab Results  Component Value Date   CHOL 178 11/05/2020   Lab Results  Component Value Date   HDL 46 11/05/2020   Lab Results  Component Value Date   LDLCALC 114 (H) 11/05/2020   Lab Results  Component Value Date   TRIG 101 11/05/2020   No results found for: "CHOLHDL" No results found for: "HGBA1C"    Assessment & Plan:  Hypertension, unspecified type Assessment & Plan: Uncontrolled We  reinstate hydrochlorothiazide 12.5 mg daily Patient is asymptomatic today in the clinic Encouraged to check her blood pressure daily and bring her ambulatory readings with her at her next appointment Encourage low-sodium diet with increased physical activity We will follow-up on BP in 4 weeks BP Readings from Last 3 Encounters:  05/11/22 (!) 142/86  04/06/22 133/84  01/15/22 133/86     Orders: -     CBC with Differential/Platelet -     CMP14+EGFR -     hydroCHLOROthiazide; Take 1 tablet (12.5 mg total) by mouth daily.  Dispense: 30 tablet; Refill: 1  Tobacco use Assessment & Plan: Congratulated the patient on quitting smoking 32 days ago    Hypothyroidism, unspecified type Assessment & Plan: She underwent RAI ablation for Graves disease on 11/15/20 and has since been started on thyroid hormone replacement She takes Synthroid 188 mcg daily Reports compliance with treatment regiment She is following up with her endocrinologist Encouraged to continue treatment regimen Lab Results  Component Value Date   TSH 6.940 (H) 03/19/2022      Hyperthyroidism -     TSH + free T4  Generalized anxiety disorder Assessment & Plan: Reports increased anxiety Likely since quitting smoking Denies suicidal thoughts or ideation We will start the patient on Prozac 10 mg daily Encouraged to take at the same time each day, with or without food May take at least 2 weeks for benefit, and 6-8 weeks for full effect Do not abruptly discontinue to avoid withdrawal symptoms   Orders: -     FLUoxetine HCl; Take 1 tablet (10 mg total) by mouth daily.  Dispense: 90 tablet; Refill: 3  Morbid obesity ([place]) Assessment &  Plan: Refilled phentermine 37.5 mg Encouraged to increase her physical activity and a heart healthy diet Wt Readings from Last 3 Encounters:  05/11/22 252 lb 0.6 oz (114.3 kg)  04/06/22 247 lb 3.2 oz (112.1 kg)  01/15/22 234 lb (106.1 kg)     Orders: -     Phentermine HCl; Take 1  tablet (37.5 mg total) by mouth daily before breakfast.  Dispense: 30 tablet; Refill: 0  Vitamin D deficiency -     VITAMIN D 25 Hydroxy (Vit-D Deficiency, Fractures)  Impaired fasting blood sugar -     Hemoglobin A1c  Other hyperlipidemia -     Lipid panel    Follow-up: Return in about 1 month (around 06/11/2022) for BP.   Alvira Monday, FNP

## 2022-05-11 NOTE — Patient Instructions (Addendum)
I appreciate the opportunity to provide care to you today!    Follow up:  1 month for BP  Labs: please stop by the lab today to get your blood drawn (CBC, CMP, TSH, Lipid profile, HgA1c, Vit D)   Congratulations on quitting smoking, So Proud of YOU!  Hypertension Please start taking hydrochlorothiazide 12.5 mg daily I recommend low-sodium diet with increased physical activity Please check your blood pressure at home daily and bring ambulatory readings with you at your next appointment I want your blood pressure less than 140/90   Anxiety Please start taking Prozac 10 mg daily  Take at the same time each day, with or without food May take at least 2 weeks for benefit, and 6-8 weeks for full effect Do not abruptly discontinue to avoid withdrawal symptoms   Obesity I've refilled your phentermine 37.5 mg to take daily Please increase your physical activity Physical activity helps: Lower your blood glucose, improve your heart health, lower your blood pressure and cholesterol, burn calories to help manage her weight, gave you energy, lower stress, and improve his sleep.  The American diabetes Association (ADA) recommends being active for 2-1/2 hours (150 minutes) or more week.  Exercise for 30 minutes, 5 days a week (150 minutes total)      Please continue to a heart-healthy diet and increase your physical activities. Try to exercise for 59mns at least five times a week.      It was a pleasure to see you and I look forward to continuing to work together on your health and well-being. Please do not hesitate to call the office if you need care or have questions about your care.   Have a wonderful day and week. With Gratitude, GAlvira MondayMSN, FNP-BC

## 2022-05-11 NOTE — Assessment & Plan Note (Signed)
Reports increased anxiety Likely since quitting smoking Denies suicidal thoughts or ideation We will start the patient on Prozac 10 mg daily Encouraged to take at the same time each day, with or without food May take at least 2 weeks for benefit, and 6-8 weeks for full effect Do not abruptly discontinue to avoid withdrawal symptoms

## 2022-05-11 NOTE — Assessment & Plan Note (Signed)
She underwent RAI ablation for Graves disease on 11/15/20 and has since been started on thyroid hormone replacement She takes Synthroid 188 mcg daily Reports compliance with treatment regiment She is following up with her endocrinologist Encouraged to continue treatment regimen Lab Results  Component Value Date   TSH 6.940 (H) 03/19/2022

## 2022-05-11 NOTE — Assessment & Plan Note (Signed)
Refilled phentermine 37.5 mg Encouraged to increase her physical activity and a heart healthy diet Wt Readings from Last 3 Encounters:  05/11/22 252 lb 0.6 oz (114.3 kg)  04/06/22 247 lb 3.2 oz (112.1 kg)  01/15/22 234 lb (106.1 kg)

## 2022-05-11 NOTE — Assessment & Plan Note (Signed)
Uncontrolled We reinstate hydrochlorothiazide 12.5 mg daily Patient is asymptomatic today in the clinic Encouraged to check her blood pressure daily and bring her ambulatory readings with her at her next appointment Encourage low-sodium diet with increased physical activity We will follow-up on BP in 4 weeks BP Readings from Last 3 Encounters:  05/11/22 (!) 142/86  04/06/22 133/84  01/15/22 133/86

## 2022-05-11 NOTE — Assessment & Plan Note (Signed)
Congratulated the patient on quitting smoking 32 days ago

## 2022-05-14 LAB — CBC WITH DIFFERENTIAL/PLATELET
Basophils Absolute: 0.1 10*3/uL (ref 0.0–0.2)
Basos: 1 %
EOS (ABSOLUTE): 0.2 10*3/uL (ref 0.0–0.4)
Eos: 2 %
Hematocrit: 46 % (ref 34.0–46.6)
Hemoglobin: 15.9 g/dL (ref 11.1–15.9)
Immature Grans (Abs): 0 10*3/uL (ref 0.0–0.1)
Immature Granulocytes: 0 %
Lymphocytes Absolute: 2.5 10*3/uL (ref 0.7–3.1)
Lymphs: 28 %
MCH: 30.1 pg (ref 26.6–33.0)
MCHC: 34.6 g/dL (ref 31.5–35.7)
MCV: 87 fL (ref 79–97)
Monocytes Absolute: 0.5 10*3/uL (ref 0.1–0.9)
Monocytes: 6 %
Neutrophils Absolute: 5.6 10*3/uL (ref 1.4–7.0)
Neutrophils: 63 %
Platelets: 305 10*3/uL (ref 150–450)
RBC: 5.28 x10E6/uL (ref 3.77–5.28)
RDW: 12.5 % (ref 11.7–15.4)
WBC: 8.9 10*3/uL (ref 3.4–10.8)

## 2022-05-14 LAB — TSH+FREE T4
Free T4: 1.37 ng/dL (ref 0.82–1.77)
TSH: 8.65 u[IU]/mL — ABNORMAL HIGH (ref 0.450–4.500)

## 2022-05-14 LAB — CMP14+EGFR
ALT: 29 IU/L (ref 0–32)
AST: 18 IU/L (ref 0–40)
Albumin/Globulin Ratio: 1.8 (ref 1.2–2.2)
Albumin: 4.8 g/dL (ref 3.9–4.9)
Alkaline Phosphatase: 81 IU/L (ref 44–121)
BUN/Creatinine Ratio: 14 (ref 9–23)
BUN: 11 mg/dL (ref 6–20)
Bilirubin Total: 0.6 mg/dL (ref 0.0–1.2)
CO2: 22 mmol/L (ref 20–29)
Calcium: 9.8 mg/dL (ref 8.7–10.2)
Chloride: 101 mmol/L (ref 96–106)
Creatinine, Ser: 0.8 mg/dL (ref 0.57–1.00)
Globulin, Total: 2.6 g/dL (ref 1.5–4.5)
Glucose: 98 mg/dL (ref 70–99)
Potassium: 4.3 mmol/L (ref 3.5–5.2)
Sodium: 139 mmol/L (ref 134–144)
Total Protein: 7.4 g/dL (ref 6.0–8.5)
eGFR: 97 mL/min/{1.73_m2} (ref >59)

## 2022-05-14 LAB — LIPID PANEL
Chol/HDL Ratio: 4.6 ratio — ABNORMAL HIGH (ref 0.0–4.4)
Cholesterol, Total: 220 mg/dL — ABNORMAL HIGH (ref 100–199)
HDL: 48 mg/dL (ref >39)
LDL Chol Calc (NIH): 154 mg/dL — ABNORMAL HIGH (ref 0–99)
Triglycerides: 99 mg/dL (ref 0–149)
VLDL Cholesterol Cal: 18 mg/dL (ref 5–40)

## 2022-05-14 LAB — HEMOGLOBIN A1C
Est. average glucose Bld gHb Est-mCnc: 105 mg/dL
Hgb A1c MFr Bld: 5.3 % (ref 4.8–5.6)

## 2022-05-14 LAB — VITAMIN D 25 HYDROXY (VIT D DEFICIENCY, FRACTURES): Vit D, 25-Hydroxy: 21.2 ng/mL — ABNORMAL LOW (ref 30.0–100.0)

## 2022-06-09 ENCOUNTER — Encounter: Payer: Self-pay | Admitting: Family Medicine

## 2022-06-09 ENCOUNTER — Other Ambulatory Visit: Payer: Self-pay | Admitting: Family Medicine

## 2022-06-09 DIAGNOSIS — F411 Generalized anxiety disorder: Secondary | ICD-10-CM

## 2022-06-09 MED ORDER — BUSPIRONE HCL 5 MG PO TABS: 5.0000 mg | ORAL_TABLET | Freq: Two times a day (BID) | ORAL | 0 refills | Status: DC

## 2022-06-12 ENCOUNTER — Other Ambulatory Visit: Payer: Self-pay | Admitting: Family Medicine

## 2022-06-18 NOTE — Telephone Encounter (Signed)
Will need an appt 

## 2022-06-19 ENCOUNTER — Other Ambulatory Visit: Payer: Self-pay | Admitting: Family Medicine

## 2022-06-19 NOTE — Telephone Encounter (Signed)
Needs an appt

## 2022-06-24 ENCOUNTER — Encounter: Payer: Self-pay | Admitting: Family Medicine

## 2022-06-24 NOTE — Telephone Encounter (Signed)
Please inform the patient that she will need an office appt for medication refill to monitor her heart rate and BP

## 2022-06-25 ENCOUNTER — Other Ambulatory Visit: Payer: Self-pay | Admitting: Family Medicine

## 2022-06-25 ENCOUNTER — Telehealth: Payer: Self-pay | Admitting: Family Medicine

## 2022-06-25 MED ORDER — PHENTERMINE HCL 37.5 MG PO TABS: 37.5000 mg | ORAL_TABLET | Freq: Every day | ORAL | 0 refills | Status: DC

## 2022-06-25 NOTE — Telephone Encounter (Signed)
I called and spoke with the patient regarding the importance checking her HR and respiration. she would like to discuss other wt loss options. Discuss wegovy and informed the patient to verify with her insurance.

## 2022-07-06 ENCOUNTER — Ambulatory Visit: Payer: Commercial Managed Care - PPO | Admitting: Nurse Practitioner

## 2022-07-09 ENCOUNTER — Other Ambulatory Visit: Payer: Self-pay | Admitting: Family Medicine

## 2022-07-09 DIAGNOSIS — F411 Generalized anxiety disorder: Secondary | ICD-10-CM

## 2022-07-09 MED ORDER — BUSPIRONE HCL 5 MG PO TABS: 5.0000 mg | ORAL_TABLET | Freq: Two times a day (BID) | ORAL | 0 refills | Status: DC

## 2022-07-29 ENCOUNTER — Encounter (HOSPITAL_COMMUNITY): Payer: Self-pay | Admitting: Hematology

## 2022-08-05 ENCOUNTER — Ambulatory Visit (INDEPENDENT_AMBULATORY_CARE_PROVIDER_SITE_OTHER): Payer: Commercial Managed Care - PPO | Admitting: Family Medicine

## 2022-08-05 VITALS — BP 142/86 | HR 129 | Ht 65.0 in | Wt 245.0 lb

## 2022-08-05 DIAGNOSIS — E038 Other specified hypothyroidism: Secondary | ICD-10-CM

## 2022-08-05 DIAGNOSIS — I10 Essential (primary) hypertension: Secondary | ICD-10-CM

## 2022-08-05 DIAGNOSIS — F322 Major depressive disorder, single episode, severe without psychotic features: Secondary | ICD-10-CM | POA: Diagnosis not present

## 2022-08-05 DIAGNOSIS — F411 Generalized anxiety disorder: Secondary | ICD-10-CM

## 2022-08-05 DIAGNOSIS — F3289 Other specified depressive episodes: Secondary | ICD-10-CM

## 2022-08-05 MED ORDER — AMLODIPINE BESYLATE 5 MG PO TABS
5.0000 mg | ORAL_TABLET | Freq: Every day | ORAL | 1 refills | Status: DC
Start: 2022-08-05 — End: 2022-10-05

## 2022-08-05 MED ORDER — WEGOVY 0.25 MG/0.5ML ~~LOC~~ SOAJ: 0.2500 mg | SUBCUTANEOUS | 0 refills | Status: DC

## 2022-08-05 MED ORDER — DULOXETINE HCL 30 MG PO CPEP: 30.0000 mg | ORAL_CAPSULE | Freq: Every day | ORAL | 3 refills | Status: DC

## 2022-08-05 MED ORDER — BUSPIRONE HCL 7.5 MG PO TABS: 7.5000 mg | ORAL_TABLET | Freq: Two times a day (BID) | ORAL | 1 refills | Status: DC

## 2022-08-05 NOTE — Assessment & Plan Note (Signed)
GAD-7 is 21 She reports switching to dayshift 2 months ago and has had increased stress due to her husband working night shift and her being on day shift She reports barely seeing her husband noting that they have not been apart since being married She denies suicidal thoughts and ideation The patient is tearful in the clinic and is being consoled by her husband She reports minimal relief with BuSpar 5 mg twice daily Will increase the dose to 7.5 mg twice daily Referral placed to psychiatry The patient reports decreased libido with SSRIs

## 2022-08-05 NOTE — Progress Notes (Signed)
Established Patient Office Visit  Subjective:  Patient ID: Paula Myers, female    DOB: 10-01-1985  Age: 37 y.o. MRN: [id number]  CC:  Chief Complaint  Patient presents with   Chronic Care Management    3 month f/u, pt reports anxiety flare up, hasn't been able to sleep due to this, having high heart rate and palpitations. Hasn't seen any effect from taking buspar. Would like to discuss weight medications.     HPI Paula Myers is a 37 y.o. female with past medical history of hypertension, hypothyroidism, generalized anxiety disorder, major depressive disorder, and morbid obesity presents for f/u of  chronic medical conditions. For the details of today's visit, please refer to the assessment and plan.     Wt Readings from Last 3 Encounters:  08/05/22 245 lb 0.6 oz (111.1 kg)  05/11/22 252 lb 0.6 oz (114.3 kg)  04/06/22 247 lb 3.2 oz (112.1 kg)     Past Medical History:  Diagnosis Date   Anemia    prior pregnancies   Anxiety    Depression    Dysrhythmia    "skipping" HR at beginning of pregnancy   H/O cervical polypectomy    Heartburn during pregnancy    History of cesarean section 08/22/2014   Hypertension 2005   while in labor   Hyperthyroidism    Iron deficiency anemia due to chronic blood loss 11/21/2020   Kidney stone    Palpitation    SVT (supraventricular tachycardia)    SVT (supraventricular tachycardia)    Tachycardia     Past Surgical History:  Procedure Laterality Date   ABDOMINAL HYSTERECTOMY N/A 05/13/2021   Procedure: HYSTERECTOMY ABDOMINAL;  Surgeon: Myna Hidalgo, DO;  Location: [facility];  Service: Gynecology;  Laterality: N/A;   CESAREAN SECTION     06/11/09; 05/25/2003   CESAREAN SECTION WITH BILATERAL TUBAL LIGATION Bilateral 08/22/2014   Procedure: CESAREAN SECTION WITH BILATERAL TUBAL LIGATION;  Surgeon: Essie Hart, MD;  Location: [facility];  Service: Obstetrics;  Laterality: Bilateral;   DILATION AND CURETTAGE OF UTERUS  01/2008    ELBOW SURGERY     Left    Family History  Problem Relation Age of Onset   Cancer Paternal Grandfather        thyroid   Cancer Paternal Grandmother        ovarian   Diabetes Maternal Grandmother    Hypertension Maternal Grandmother    Heart disease Maternal Grandmother    Hypertension Mother    Diabetes Mother    Diabetes Brother     Social History   Socioeconomic History   Marital status: Married    Spouse name: Not on file   Number of children: 3   Years of education: Not on file   Highest education level: Associate degree: occupational, Scientist, product/process development, or vocational program  Occupational History   Occupation: [employer]    Comment: Dispatcher  Tobacco Use   Smoking status: Former    Packs/day: 0.25    Years: 20.00    Additional pack years: 0.00    Total pack years: 5.00    Types: Cigarettes    Quit date: 04/10/2022    Years since quitting: 0.3   Smokeless tobacco: Never   Tobacco comments:    Quit 32 days ago.   Vaping Use   Vaping Use: Former  Substance and Sexual Activity   Alcohol use: Not Currently    Comment: maybe 1-2 per year   Drug use: No  Sexual activity: Yes    Birth control/protection: Surgical    Comment: hyst  Other Topics Concern   Not on file  Social History Narrative   Not on file   Social Determinants of Health   Financial Resource Strain: Low Risk  (08/05/2022)   Overall Financial Resource Strain (CARDIA)    Difficulty of Paying Living Expenses: Not hard at all  Food Insecurity: No Food Insecurity (08/05/2022)   Hunger Vital Sign    Worried About Running Out of Food in the Last Year: Never true    Ran Out of Food in the Last Year: Never true  Transportation Needs: No Transportation Needs (08/05/2022)   PRAPARE - Administrator, Civil Service (Medical): No    Lack of Transportation (Non-Medical): No  Physical Activity: Sufficiently Active (08/05/2022)   Exercise Vital Sign    Days of Exercise per Week: 3 days     Minutes of Exercise per Session: 120 min  Stress: Stress Concern Present (08/05/2022)   [employer] of Occupational Health - Occupational Stress Questionnaire    Feeling of Stress : Very much  Social Connections: Moderately Isolated (08/05/2022)   Social Connection and Isolation Panel [NHANES]    Frequency of Communication with Friends and Family: Three times a week    Frequency of Social Gatherings with Friends and Family: Never    Attends Religious Services: Never    Database administrator or Organizations: No    Attends Engineer, structural: Not on file    Marital Status: Married  Catering manager Violence: Not At Risk (03/14/2021)   Humiliation, Afraid, Rape, and Kick questionnaire    Fear of Current or Ex-Partner: No    Emotionally Abused: No    Physically Abused: No    Sexually Abused: No    Outpatient Medications Prior to Visit  Medication Sig Dispense Refill   acetaminophen (TYLENOL) 325 MG tablet Take 2 tablets (650 mg total) by mouth every 6 (six) hours as needed for moderate pain or mild pain.     benzoyl peroxide-erythromycin (BENZAMYCIN) gel Apply topically 2 (two) times daily. 23.3 g 0   hydrochlorothiazide (HYDRODIURIL) 12.5 MG tablet Take 1 tablet (12.5 mg total) by mouth daily. 30 tablet 1   ibuprofen (ADVIL) 600 MG tablet Take 1 tablet (600 mg total) by mouth every 6 (six) hours as needed. 30 tablet 0   levothyroxine (SYNTHROID) 100 MCG tablet Take 1 tablet (100 mcg total) by mouth daily before breakfast. 90 tablet 1   levothyroxine (SYNTHROID) 88 MCG tablet Take 1 tablet (88 mcg total) by mouth daily. 90 tablet 1   nystatin (MYCOSTATIN/NYSTOP) powder Apply 1 application. topically 3 (three) times daily. 15 g 0   phentermine (ADIPEX-P) 37.5 MG tablet Take 1 tablet (37.5 mg total) by mouth daily before breakfast. 30 tablet 0   tretinoin (RETIN-A) 0.025 % cream Apply topically at bedtime. 45 g 0   busPIRone (BUSPAR) 5 MG tablet Take 1 tablet (5 mg total) by  mouth 2 (two) times daily. 60 tablet 0   FLUoxetine (PROZAC) 10 MG tablet Take 1 tablet (10 mg total) by mouth daily. 90 tablet 3   No facility-administered medications prior to visit.    Allergies  Allergen Reactions   Levaquin [Levofloxacin In D5w] Anaphylaxis   Levofloxacin Anaphylaxis   Pertussis Vaccines Swelling    T-Dap injection caused localized swelling of arm. Has had Tetanus before without problems.   Megace [Megestrol] Other (See Comments)    Cause  Depression   Tetanus-Diphth-Acell Pertussis Swelling   Venofer [Iron Sucrose] Other (See Comments)    Chest Pain    ROS Review of Systems  Constitutional:  Negative for chills and fever.  Eyes:  Negative for visual disturbance.  Respiratory:  Negative for chest tightness and shortness of breath.   Neurological:  Negative for dizziness and headaches.  Psychiatric/Behavioral:  Negative for suicidal ideas.       Objective:    Physical Exam Constitutional:      Appearance: She is obese.  HENT:     Head: Normocephalic.     Mouth/Throat:     Mouth: Mucous membranes are moist.  Cardiovascular:     Rate and Rhythm: Normal rate.     Heart sounds: Normal heart sounds.  Pulmonary:     Effort: Pulmonary effort is normal.     Breath sounds: Normal breath sounds.  Neurological:     Mental Status: She is alert.     BP (!) 142/86 (BP Location: Left Arm)   Pulse (!) 129   Ht 5\' 5"  (1.651 m)   Wt 245 lb 0.6 oz (111.1 kg)   LMP 04/30/2021 Comment: Negative HCG on 05/09/2021  SpO2 98%   BMI 40.78 kg/m  Wt Readings from Last 3 Encounters:  08/05/22 245 lb 0.6 oz (111.1 kg)  05/11/22 252 lb 0.6 oz (114.3 kg)  04/06/22 247 lb 3.2 oz (112.1 kg)    Lab Results  Component Value Date   TSH 8.650 (H) 05/13/2022   Lab Results  Component Value Date   WBC 8.9 05/13/2022   HGB 15.9 05/13/2022   HCT 46.0 05/13/2022   MCV 87 05/13/2022   PLT 305 05/13/2022   Lab Results  Component Value Date   NA 139 05/13/2022   K  4.3 05/13/2022   CO2 22 05/13/2022   GLUCOSE 98 05/13/2022   BUN 11 05/13/2022   CREATININE 0.80 05/13/2022   BILITOT 0.6 05/13/2022   ALKPHOS 81 05/13/2022   AST 18 05/13/2022   ALT 29 05/13/2022   PROT 7.4 05/13/2022   ALBUMIN 4.8 05/13/2022   CALCIUM 9.8 05/13/2022   ANIONGAP 6 06/28/2021   EGFR 97 05/13/2022   GFR 102.38 01/15/2014   Lab Results  Component Value Date   CHOL 220 (H) 05/13/2022   Lab Results  Component Value Date   HDL 48 05/13/2022   Lab Results  Component Value Date   LDLCALC 154 (H) 05/13/2022   Lab Results  Component Value Date   TRIG 99 05/13/2022   Lab Results  Component Value Date   CHOLHDL 4.6 (H) 05/13/2022   Lab Results  Component Value Date   HGBA1C 5.3 05/13/2022      Assessment & Plan:  Generalized anxiety disorder Assessment & Plan: GAD-7 is 21 She reports switching to dayshift 2 months ago and has had increased stress due to her husband working night shift and her being on day shift She reports barely seeing her husband noting that they have not been apart since being married She denies suicidal thoughts and ideation The patient is tearful in the clinic and is being consoled by her husband She reports minimal relief with BuSpar 5 mg twice daily Will increase the dose to 7.5 mg twice daily Referral placed to psychiatry The patient reports decreased libido with SSRIs   Orders: -     Ambulatory referral to Psychiatry -     busPIRone HCl; Take 1 tablet (7.5 mg total) by mouth 2 (two)  times daily.  Dispense: 60 tablet; Refill: 1  Depression, major, single episode, severe (HCC) -     Ambulatory referral to Psychiatry -     DULoxetine HCl; Take 1 capsule (30 mg total) by mouth daily.  Dispense: 30 capsule; Refill: 3  Morbid obesity (HCC) Assessment & Plan: Denies family and personal history of thyroid cancer or multiple endocrine neoplasia type II Patient was on phentermine 37.5 daily and would like to start [facility] Order  placed Encouraged heart healthy diet with increased physical activity Wt Readings from Last 3 Encounters:  08/05/22 245 lb 0.6 oz (111.1 kg)  05/11/22 252 lb 0.6 oz (114.3 kg)  04/06/22 247 lb 3.2 oz (112.1 kg)     Orders: -     ZOXWRU; Inject 0.25 mg into the skin once a week.  Dispense: 2 mL; Refill: 0  Other specified hypothyroidism Assessment & Plan: Pending thyroid level We will forward to her endocrinologist for review Lab Results  Component Value Date   TSH 8.650 (H) 05/13/2022     Orders: -     TSH + free T4  Primary hypertension Assessment & Plan: Uncontrolled Reports ambulatory readings in the 140s systolic and 80s diastolic Asymptomatic in the clinic Will start the patient on amlodipine 5 mg daily Encouraged to continue taking hydrochlorothiazide 12.5 mg daily Will follow-up in 4 weeks Low-sodium diet with increased physical activity encouraged BP Readings from Last 3 Encounters:  08/05/22 (!) 142/86  05/11/22 (!) 142/86  04/06/22 133/84       Orders: -     amLODIPine Besylate; Take 1 tablet (5 mg total) by mouth daily.  Dispense: 30 tablet; Refill: 1  Other depression Assessment & Plan: PHQ-9 is 19 Denies suicidal thoughts and ideation She reports decreased libido with SSRIs Will start the patient on Cymbalta 30 mg daily Reviewed symptoms of serotonin syndrome Encouraged to not abruptly discontinue the medication We will follow-up in 4 weeks Referral placed to psychiatry    Note: This chart has been completed using Dragon Medical Dictation software, and while attempts have been made to ensure accuracy, certain words and phrases may not be transcribed as intended.    Follow-up: Return in about 1 month (around 09/04/2022) for BP, obesity, anxiety and depression.   Gilmore Laroche, FNP

## 2022-08-05 NOTE — Assessment & Plan Note (Signed)
Denies family and personal history of thyroid cancer or multiple endocrine neoplasia type II Patient was on phentermine 37.5 daily and would like to start [facility] Order placed Encouraged heart healthy diet with increased physical activity Wt Readings from Last 3 Encounters:  08/05/22 245 lb 0.6 oz (111.1 kg)  05/11/22 252 lb 0.6 oz (114.3 kg)  04/06/22 247 lb 3.2 oz (112.1 kg)

## 2022-08-05 NOTE — Assessment & Plan Note (Signed)
Uncontrolled Reports ambulatory readings in the 140s systolic and 80s diastolic Asymptomatic in the clinic Will start the patient on amlodipine 5 mg daily Encouraged to continue taking hydrochlorothiazide 12.5 mg daily Will follow-up in 4 weeks Low-sodium diet with increased physical activity encouraged BP Readings from Last 3 Encounters:  08/05/22 (!) 142/86  05/11/22 (!) 142/86  04/06/22 133/84

## 2022-08-05 NOTE — Assessment & Plan Note (Signed)
PHQ-9 is 19 Denies suicidal thoughts and ideation She reports decreased libido with SSRIs Will start the patient on Cymbalta 30 mg daily Reviewed symptoms of serotonin syndrome Encouraged to not abruptly discontinue the medication We will follow-up in 4 weeks Referral placed to psychiatry

## 2022-08-05 NOTE — Patient Instructions (Addendum)
I appreciate the opportunity to provide care to you today!    Follow up:  1 month  Labs: please stop by the lab today to get your blood drawn (TSH)  Anxiety and depression Please start taking BuSpar 7.5 mg twice daily for your anxiety and Cymbalta 30 mg daily for your depression Please take the medications at the same time each day, with or without food May take at least 2 weeks for benefit, and 6-8 weeks for full effect Do not abruptly discontinue to avoid withdrawal symptoms   Serotonin Syndrome Sweating. Restlessness or agitation. Muscle twitching or stiffness. Rapid heart rate. Nausea, vomiting, or diarrhea. Shivering or goose bumps. Confusion. Severe symptoms include: Irregular heartbeat. Seizures. Loss of consciousness. High fever.    Referral: Psychiatry  Please continue to a heart-healthy diet and increase your physical activities. Try to exercise for at least five days a week.      It was a pleasure to see you and I look forward to continuing to work together on your health and well-being. Please do not hesitate to call the office if you need care or have questions about your care.   Have a wonderful day and week. With Gratitude, Gilmore Laroche MSN, FNP-BC

## 2022-08-05 NOTE — Assessment & Plan Note (Signed)
Pending thyroid level We will forward to her endocrinologist for review Lab Results  Component Value Date   TSH 8.650 (H) 05/13/2022

## 2022-08-07 ENCOUNTER — Encounter: Payer: Self-pay | Admitting: Family Medicine

## 2022-08-08 ENCOUNTER — Emergency Department (HOSPITAL_COMMUNITY)
Admission: EM | Admit: 2022-08-08 | Discharge: 2022-08-08 | Disposition: A | Discharge status: Home / Self Care | Payer: Commercial Managed Care - PPO | Attending: Emergency Medicine | Admitting: Emergency Medicine

## 2022-08-08 ENCOUNTER — Encounter (HOSPITAL_COMMUNITY): Payer: Self-pay | Admitting: Emergency Medicine

## 2022-08-08 ENCOUNTER — Other Ambulatory Visit: Payer: Self-pay | Admitting: Family Medicine

## 2022-08-08 ENCOUNTER — Emergency Department (HOSPITAL_COMMUNITY): Payer: Commercial Managed Care - PPO

## 2022-08-08 ENCOUNTER — Other Ambulatory Visit: Payer: Self-pay

## 2022-08-08 DIAGNOSIS — R000 Tachycardia, unspecified: Secondary | ICD-10-CM | POA: Diagnosis not present

## 2022-08-08 DIAGNOSIS — R519 Headache, unspecified: Secondary | ICD-10-CM | POA: Diagnosis present

## 2022-08-08 LAB — URINALYSIS, W/ REFLEX TO CULTURE (INFECTION SUSPECTED)
Bacteria, UA: NONE SEEN
Bilirubin Urine: NEGATIVE
Glucose, UA: NEGATIVE mg/dL
Hgb urine dipstick: NEGATIVE
Ketones, ur: NEGATIVE mg/dL
Leukocytes,Ua: NEGATIVE
Nitrite: NEGATIVE
Protein, ur: NEGATIVE mg/dL
Specific Gravity, Urine: 1.008 (ref 1.005–1.030)
pH: 6 (ref 5.0–8.0)

## 2022-08-08 LAB — BASIC METABOLIC PANEL
Anion gap: 10 (ref 5–15)
BUN: 11 mg/dL (ref 6–20)
CO2: 25 mmol/L (ref 22–32)
Calcium: 9.3 mg/dL (ref 8.9–10.3)
Chloride: 100 mmol/L (ref 98–111)
Creatinine, Ser: 0.82 mg/dL (ref 0.44–1.00)
GFR, Estimated: >60 mL/min (ref >60)
Glucose, Bld: 92 mg/dL (ref 70–99)
Potassium: 3.6 mmol/L (ref 3.5–5.1)
Sodium: 135 mmol/L (ref 135–145)

## 2022-08-08 LAB — CBC
HCT: 45.7 % (ref 36.0–46.0)
Hemoglobin: 16 g/dL — ABNORMAL HIGH (ref 12.0–15.0)
MCH: 29.4 pg (ref 26.0–34.0)
MCHC: 35 g/dL (ref 30.0–36.0)
MCV: 83.9 fL (ref 80.0–100.0)
Platelets: 336 10*3/uL (ref 150–400)
RBC: 5.45 MIL/uL — ABNORMAL HIGH (ref 3.87–5.11)
RDW: 12.2 % (ref 11.5–15.5)
WBC: 12.4 10*3/uL — ABNORMAL HIGH (ref 4.0–10.5)
nRBC: 0 % (ref 0.0–0.2)

## 2022-08-08 LAB — TROPONIN I (HIGH SENSITIVITY)
Troponin I (High Sensitivity): 4 ng/L (ref <18)
Troponin I (High Sensitivity): 3 ng/L (ref <18)

## 2022-08-08 LAB — TSH+FREE T4
Free T4: 2.4 ng/dL — ABNORMAL HIGH (ref 0.82–1.77)
TSH: 0.025 u[IU]/mL — ABNORMAL LOW (ref 0.450–4.500)

## 2022-08-08 MED ORDER — HYDROCODONE-ACETAMINOPHEN 5-325 MG PO TABS
1.0000 | ORAL_TABLET | Freq: Once | ORAL | Status: AC
  Administered 2022-08-08: 1 via ORAL
  Filled 2022-08-08: qty 1

## 2022-08-08 NOTE — ED Provider Notes (Signed)
[place] EMERGENCY DEPARTMENT AT [facility] Provider Note   CSN: [id number] Arrival date & time: 08/08/22  1953     History  Chief Complaint  Patient presents with   Hypertension   Chest Pain   Headache    Paula Myers is a 37 y.o. female.  Patient states that she has been having some headaches and anxiety has been worse since she started her Cymbalta  The history is provided by the patient and medical records. No language interpreter was used.  Headache Pain location:  Generalized Quality:  Dull Radiates to:  Does not radiate Severity currently:  7/10 Severity at highest:  8/10 Onset quality:  Sudden Timing:  Intermittent Progression:  Waxing and waning Chronicity:  New Similar to prior headaches: no   Context: not activity   Relieved by:  Nothing Worsened by:  Nothing Associated symptoms: no abdominal pain, no back pain, no congestion, no cough, no diarrhea, no fatigue, no seizures and no sinus pressure        Home Medications Prior to Admission medications   Medication Sig Start Date End Date Taking? Authorizing Provider  acetaminophen (TYLENOL) 325 MG tablet Take 2 tablets (650 mg total) by mouth every 6 (six) hours as needed for moderate pain or mild pain. 05/13/21   Myna Hidalgo, DO  amLODipine (NORVASC) 5 MG tablet Take 1 tablet (5 mg total) by mouth daily. 08/05/22   Gilmore Laroche, FNP  benzoyl peroxide-erythromycin [facility]) gel Apply topically 2 (two) times daily. 01/28/22   Gilmore Laroche, FNP  busPIRone (BUSPAR) 7.5 MG tablet Take 1 tablet (7.5 mg total) by mouth 2 (two) times daily. 08/05/22   Gilmore Laroche, FNP  DULoxetine (CYMBALTA) 30 MG capsule Take 1 capsule (30 mg total) by mouth daily. 08/05/22   Gilmore Laroche, FNP  hydrochlorothiazide (HYDRODIURIL) 12.5 MG tablet Take 1 tablet (12.5 mg total) by mouth daily. 05/11/22   Gilmore Laroche, FNP  ibuprofen (ADVIL) 600 MG tablet Take 1 tablet (600 mg total) by mouth every 6 (six)  hours as needed. 05/13/21   Myna Hidalgo, DO  levothyroxine (SYNTHROID) 100 MCG tablet Take 1 tablet (100 mcg total) by mouth daily before breakfast. 04/06/22   Dani Gobble, NP  levothyroxine (SYNTHROID) 88 MCG tablet Take 1 tablet (88 mcg total) by mouth daily. 04/06/22   Dani Gobble, NP  nystatin (MYCOSTATIN/NYSTOP) powder Apply 1 application. topically 3 (three) times daily. 06/28/21   Kommor, [place], MD  phentermine (ADIPEX-P) 37.5 MG tablet Take 1 tablet (37.5 mg total) by mouth daily before breakfast. 06/25/22   Gilmore Laroche, FNP  Semaglutide-Weight Management (WEGOVY) 0.25 MG/0.5ML SOAJ Inject 0.25 mg into the skin once a week. 08/05/22   Gilmore Laroche, FNP  tretinoin (RETIN-A) 0.025 % cream Apply topically at bedtime. 12/18/21   Gilmore Laroche, FNP      Allergies    Levaquin [levofloxacin in d5w], Levofloxacin, Pertussis vaccines, Megace [megestrol], Tetanus-diphth-acell pertussis, and Venofer [iron sucrose]    Review of Systems   Review of Systems  Constitutional:  Negative for appetite change and fatigue.  HENT:  Negative for congestion, ear discharge and sinus pressure.   Eyes:  Negative for discharge.  Respiratory:  Negative for cough.   Cardiovascular:  Negative for chest pain.  Gastrointestinal:  Negative for abdominal pain and diarrhea.  Genitourinary:  Negative for frequency and hematuria.  Musculoskeletal:  Negative for back pain.  Skin:  Negative for rash.  Neurological:  Positive for headaches. Negative for seizures.  Psychiatric/Behavioral:  Positive for agitation. Negative for hallucinations.     Physical Exam Updated Vital Signs BP (!) 143/88   Pulse 94   Temp 98.4 F (36.9 C) (Oral)   Resp 19   Ht 5\' 5"  (1.651 m)   Wt 111 kg   LMP 04/30/2021 Comment: Negative HCG on 05/09/2021  SpO2 98%   BMI 40.72 kg/m  Physical Exam Vitals and nursing note reviewed.  Constitutional:      Appearance: She is well-developed.  HENT:     Head:  Normocephalic.     Nose: Nose normal.  Eyes:     General: No scleral icterus.    Conjunctiva/sclera: Conjunctivae normal.  Neck:     Thyroid: No thyromegaly.  Cardiovascular:     Rate and Rhythm: Normal rate and regular rhythm.     Heart sounds: No murmur heard.    No friction rub. No gallop.  Pulmonary:     Breath sounds: No stridor. No wheezing or rales.  Chest:     Chest wall: No tenderness.  Abdominal:     General: There is no distension.     Tenderness: There is no abdominal tenderness. There is no rebound.  Musculoskeletal:        General: Normal range of motion.     Cervical back: Neck supple.  Lymphadenopathy:     Cervical: No cervical adenopathy.  Skin:    Findings: No erythema or rash.  Neurological:     Mental Status: She is alert and oriented to person, place, and time.     Motor: No abnormal muscle tone.     Coordination: Coordination normal.  Psychiatric:     Comments: Anxious     ED Results / Procedures / Treatments   Labs (all labs ordered are listed, but only abnormal results are displayed) Labs Reviewed  CBC - Abnormal; Notable for the following components:      Result Value   WBC 12.4 (*)    RBC 5.45 (*)    Hemoglobin 16.0 (*)    All other components within normal limits  URINALYSIS, W/ REFLEX TO CULTURE (INFECTION SUSPECTED) - Abnormal; Notable for the following components:   APPearance HAZY (*)    All other components within normal limits  BASIC METABOLIC PANEL  TROPONIN I (HIGH SENSITIVITY)  TROPONIN I (HIGH SENSITIVITY)    EKG EKG Interpretation  Date/Time:  Saturday August 08 2022 20:07:44 EDT Ventricular Rate:  116 PR Interval:  118 QRS Duration: 78 QT Interval:  316 QTC Calculation: 439 R Axis:   65 Text Interpretation: Sinus tachycardia Otherwise normal ECG When compared with ECG of 09-May-2021 15:26, Vent. rate has increased BY  43 BPM Confirmed by Bethann Berkshire [phone]) on 08/08/2022 9:02:16 PM  Radiology DG Chest 2  View  Result Date: 08/08/2022 CLINICAL DATA:  Chest pain EXAM: CHEST - 2 VIEW COMPARISON:  X-ray 09/22/2019 FINDINGS: The heart size and mediastinal contours are within normal limits. Both lungs are clear. No consolidation, pneumothorax or effusion. No edema. Mild degenerative changes seen of the spine on lateral view. Overlapping cardiac leads. IMPRESSION: No acute cardiopulmonary disease Electronically Signed   By: Karen Kays M.D.   On: 08/08/2022 20:51    Procedures Procedures    Medications Ordered in ED Medications  HYDROcodone-acetaminophen (NORCO/VICODIN) 5-325 MG per tablet 1 tablet (1 tablet Oral Given 08/08/22 2124)    ED Course/ Medical Decision Making/ A&P  Medical Decision Making Amount and/or Complexity of Data Reviewed Labs: ordered. Radiology: ordered.  Risk Prescription drug management.   Patient with medication reaction.  She will stop the Cymbalta and follow-up with PCP        Final Clinical Impression(s) / ED Diagnoses Final diagnoses:  Bad headache    Rx / DC Orders ED Discharge Orders     None         Bethann Berkshire, MD 08/11/22 1231

## 2022-08-08 NOTE — ED Triage Notes (Signed)
Pt here for c/o HTN for "several days". States her BP readings at home have been 158-190 systolic and 110-112 diastolic. Pt states she has been having a lot of anxiety and was recently started on new medications for this plus had some existing medication (Buspar) increased. Pt states she has been "feeling really off" since then. Pt also c/o headache and intermittent chest pain for several days as well. Pt contacted her MD and was advised to come to ER for further evaluation.

## 2022-08-08 NOTE — Discharge Instructions (Signed)
Stop your Cymbalta and follow-up with your PCP

## 2022-08-10 ENCOUNTER — Ambulatory Visit (INDEPENDENT_AMBULATORY_CARE_PROVIDER_SITE_OTHER): Payer: Commercial Managed Care - PPO | Admitting: Nurse Practitioner

## 2022-08-10 ENCOUNTER — Encounter: Payer: Self-pay | Admitting: Nurse Practitioner

## 2022-08-10 ENCOUNTER — Telehealth: Payer: Self-pay

## 2022-08-10 VITALS — BP 144/78 | HR 77 | Ht 65.0 in | Wt 243.2 lb

## 2022-08-10 DIAGNOSIS — E890 Postprocedural hypothyroidism: Secondary | ICD-10-CM

## 2022-08-10 MED ORDER — LEVOTHYROXINE SODIUM 175 MCG PO TABS: 175.0000 ug | ORAL_TABLET | Freq: Every day | ORAL | 1 refills | Status: DC

## 2022-08-10 NOTE — Progress Notes (Signed)
I will take care of it. Thanks.  

## 2022-08-10 NOTE — Progress Notes (Signed)
08/10/2022     Endocrinology Follow Up Note    Subjective:    Patient ID: Paula Myers, female    DOB: [birth year], PCP Gilmore Laroche, FNP.   Past Medical History:  Diagnosis Date   Anemia    prior pregnancies   Anxiety    Depression    Dysrhythmia    "skipping" HR at beginning of pregnancy   H/O cervical polypectomy    Heartburn during pregnancy    History of cesarean section 08/22/2014   Hypertension 2005   while in labor   Hyperthyroidism    Iron deficiency anemia due to chronic blood loss 11/21/2020   Kidney stone    Palpitation    SVT (supraventricular tachycardia)    SVT (supraventricular tachycardia)    Tachycardia     Past Surgical History:  Procedure Laterality Date   ABDOMINAL HYSTERECTOMY N/A 05/13/2021   Procedure: HYSTERECTOMY ABDOMINAL;  Surgeon: Myna Hidalgo, DO;  Location: [facility];  Service: Gynecology;  Laterality: N/A;   CESAREAN SECTION     06/11/09; 05/25/2003   CESAREAN SECTION WITH BILATERAL TUBAL LIGATION Bilateral 08/22/2014   Procedure: CESAREAN SECTION WITH BILATERAL TUBAL LIGATION;  Surgeon: Essie Hart, MD;  Location: [facility];  Service: Obstetrics;  Laterality: Bilateral;   DILATION AND CURETTAGE OF UTERUS  01/2008   ELBOW SURGERY     Left    Social History   Socioeconomic History   Marital status: Married    Spouse name: Not on file   Number of children: 3   Years of education: Not on file   Highest education level: Associate degree: occupational, Scientist, product/process development, or vocational program  Occupational History   Occupation: [employer]    Comment: Dispatcher  Tobacco Use   Smoking status: Former    Packs/day: 0.25    Years: 20.00    Additional pack years: 0.00    Total pack years: 5.00    Types: Cigarettes    Quit date: 04/10/2022    Years since quitting: 0.3   Smokeless tobacco: Never   Tobacco comments:    Quit 32 days ago.   Vaping Use   Vaping Use: Former  Substance and Sexual Activity   Alcohol  use: Not Currently    Comment: maybe 1-2 per year   Drug use: No   Sexual activity: Yes    Birth control/protection: Surgical    Comment: hyst  Other Topics Concern   Not on file  Social History Narrative   Not on file   Social Determinants of Health   Financial Resource Strain: Low Risk  (08/05/2022)   Overall Financial Resource Strain (CARDIA)    Difficulty of Paying Living Expenses: Not hard at all  Food Insecurity: No Food Insecurity (08/05/2022)   Hunger Vital Sign    Worried About Running Out of Food in the Last Year: Never true    Ran Out of Food in the Last Year: Never true  Transportation Needs: No Transportation Needs (08/05/2022)   PRAPARE - Administrator, Civil Service (Medical): No    Lack of Transportation (Non-Medical): No  Physical Activity: Sufficiently Active (08/05/2022)   Exercise Vital Sign    Days of Exercise per Week: 3 days    Minutes of Exercise per Session: 120 min  Stress: Stress Concern Present (08/05/2022)   [employer] of Occupational Health - Occupational Stress Questionnaire    Feeling of Stress : Very much  Social Connections: Moderately Isolated (08/05/2022)   Social  Connection and Isolation Panel [NHANES]    Frequency of Communication with Friends and Family: Three times a week    Frequency of Social Gatherings with Friends and Family: Never    Attends Religious Services: Never    Database administrator or Organizations: No    Attends Engineer, structural: Not on file    Marital Status: Married    Family History  Problem Relation Age of Onset   Cancer Paternal Grandfather        thyroid   Cancer Paternal Grandmother        ovarian   Diabetes Maternal Grandmother    Hypertension Maternal Grandmother    Heart disease Maternal Grandmother    Hypertension Mother    Diabetes Mother    Diabetes Brother     Outpatient Encounter Medications as of 08/10/2022  Medication Sig   acetaminophen (TYLENOL) 325 MG tablet Take 2  tablets (650 mg total) by mouth every 6 (six) hours as needed for moderate pain or mild pain.   amLODipine (NORVASC) 5 MG tablet Take 1 tablet (5 mg total) by mouth daily.   benzoyl peroxide-erythromycin (BENZAMYCIN) gel Apply topically 2 (two) times daily.   busPIRone (BUSPAR) 7.5 MG tablet Take 1 tablet (7.5 mg total) by mouth 2 (two) times daily.   hydrochlorothiazide (HYDRODIURIL) 12.5 MG tablet Take 1 tablet (12.5 mg total) by mouth daily.   ibuprofen (ADVIL) 600 MG tablet Take 1 tablet (600 mg total) by mouth every 6 (six) hours as needed.   nystatin (MYCOSTATIN/NYSTOP) powder Apply 1 application. topically 3 (three) times daily.   Semaglutide-Weight Management (WEGOVY) 0.25 MG/0.5ML SOAJ Inject 0.25 mg into the skin once a week.   tretinoin (RETIN-A) 0.025 % cream Apply topically at bedtime.   [DISCONTINUED] levothyroxine (SYNTHROID) 100 MCG tablet Take 1 tablet (100 mcg total) by mouth daily before breakfast.   [DISCONTINUED] levothyroxine (SYNTHROID) 88 MCG tablet Take 1 tablet (88 mcg total) by mouth daily.   DULoxetine (CYMBALTA) 30 MG capsule Take 1 capsule (30 mg total) by mouth daily. (Patient not taking: Reported on 08/10/2022)   levothyroxine (SYNTHROID) 175 MCG tablet Take 1 tablet (175 mcg total) by mouth daily before breakfast.   phentermine (ADIPEX-P) 37.5 MG tablet Take 1 tablet (37.5 mg total) by mouth daily before breakfast. (Patient not taking: Reported on 08/10/2022)   No facility-administered encounter medications on file as of 08/10/2022.    ALLERGIES: Allergies  Allergen Reactions   Levaquin [Levofloxacin In D5w] Anaphylaxis   Levofloxacin Anaphylaxis   Pertussis Vaccines Swelling    T-Dap injection caused localized swelling of arm. Has had Tetanus before without problems.   Megace [Megestrol] Other (See Comments)    Cause Depression   Tetanus-Diphth-Acell Pertussis Swelling   Venofer [Iron Sucrose] Other (See Comments)    Chest Pain    VACCINATION  STATUS: Immunization History  Administered Date(s) Administered   DTaP 09/09/1983, 11/18/1983, 02/03/1984, 01/25/1985, 10/29/1988   Hepatitis B 12/09/1994, 01/14/1995, 06/16/1995   IPV 09/09/1983, 11/18/1983, 02/03/1984, 01/25/1985   Influenza,inj,Quad PF,6+ Mos 11/07/2020, 12/18/2021   Influenza-Unspecified 12/18/2002, 01/23/2014   MMR 10/28/1984, 12/26/2004   Moderna Sars-Covid-2 Vaccination 12/19/2019, 01/16/2020   Tdap 12/26/2004     HPI  Paula Myers is 37 y.o. female who presents today with a medical history as above. she is being seen in follow up after being seen in consultation for hyperthyroidism requested by Gilmore Laroche, FNP.   she denies dysphagia, choking, shortness of breath, no recent voice change.  she does family history of thyroid dysfunction in her grandmother (Graves disease) and mother (MNG requiring thyroidectomy), but denies family hx of thyroid cancer. she denies personal history of goiter. she is not on any anti-thyroid medications nor on any thyroid hormone supplements. She does intermittently use of Biotin containing supplements but has not taken it in a few weeks.   She underwent RAI ablation for Graves disease on 11/15/20 and has since been started on thyroid hormone replacement.  She notes she did miss 2 days of her levothyroxine when she was out of town about a month ago because she forgot her pills at home.   Review of systems  Constitutional: + slowly decreasing body weight,  current Body mass index is 40.47 kg/m. , + fatigue, no subjective hyperthermia, no subjective hypothermia Eyes: + blurry vision- dry eyes, no xerophthalmia ENT: no sore throat, no nodules palpated in throat, no dysphagia/odynophagia, no hoarseness Cardiovascular: no chest pain, no shortness of breath, + palpitations, no leg swelling Respiratory: no cough, no shortness of breath Gastrointestinal: + nausea with vomiting (Started when she started  Cymbalta) Musculoskeletal: no muscle/joint aches Skin: no rashes, no hyperemia Neurological: no tremors, no numbness, no tingling, no dizziness Psychiatric: no depression, no anxiety   Objective:    BP (!) 144/78 (BP Location: Left Arm, Patient Position: Sitting, Cuff Size: Large) Comment: Manuel Cuff- Retake  Pulse 77   Ht 5\' 5"  (1.651 m)   Wt 243 lb 3.2 oz (110.3 kg)   LMP 04/30/2021 Comment: Negative HCG on 05/09/2021  BMI 40.47 kg/m   Wt Readings from Last 3 Encounters:  08/10/22 243 lb 3.2 oz (110.3 kg)  08/08/22 244 lb 11.4 oz (111 kg)  08/05/22 245 lb 0.6 oz (111.1 kg)     BP Readings from Last 3 Encounters:  08/10/22 (!) 144/78  08/08/22 (!) 143/88  08/05/22 (!) 142/86                        Physical Exam- Limited  Constitutional:  Body mass index is 40.47 kg/m. , not in acute distress, normal state of mind Eyes:  EOMI, no exophthalmos Musculoskeletal: no gross deformities, strength intact in all four extremities, no gross restriction of joint movements Skin:  no rashes, no hyperemia Neurological: no tremor with outstretched hands   CMP     Component Value Date/Time   NA 135 08/08/2022 2025   NA 139 05/13/2022 1539   K 3.6 08/08/2022 2025   CL 100 08/08/2022 2025   CO2 25 08/08/2022 2025   GLUCOSE 92 08/08/2022 2025   BUN 11 08/08/2022 2025   BUN 11 05/13/2022 1539   CREATININE 0.82 08/08/2022 2025   CALCIUM 9.3 08/08/2022 2025   PROT 7.4 05/13/2022 1539   ALBUMIN 4.8 05/13/2022 1539   AST 18 05/13/2022 1539   ALT 29 05/13/2022 1539   ALKPHOS 81 05/13/2022 1539   BILITOT 0.6 05/13/2022 1539   GFRNONAA >60 08/08/2022 2025   GFRAA >60 09/22/2019 1938     CBC    Component Value Date/Time   WBC 12.4 (H) 08/08/2022 2025   RBC 5.45 (H) 08/08/2022 2025   HGB 16.0 (H) 08/08/2022 2025   HGB 15.9 05/13/2022 1539   HCT 45.7 08/08/2022 2025   HCT 46.0 05/13/2022 1539   PLT 336 08/08/2022 2025   PLT 305 05/13/2022 1539   MCV 83.9 08/08/2022 2025    MCV 87 05/13/2022 1539   MCH 29.4 08/08/2022 2025   MCHC  35.0 08/08/2022 2025   RDW 12.2 08/08/2022 2025   RDW 12.5 05/13/2022 1539   LYMPHSABS 2.5 05/13/2022 1539   MONOABS 0.6 01/15/2022 1150   EOSABS 0.2 05/13/2022 1539   BASOSABS 0.1 05/13/2022 1539     Diabetic Labs (most recent): Lab Results  Component Value Date   HGBA1C 5.3 05/13/2022    Lipid Panel     Component Value Date/Time   CHOL 220 (H) 05/13/2022 1539   TRIG 99 05/13/2022 1539   HDL 48 05/13/2022 1539   CHOLHDL 4.6 (H) 05/13/2022 1539   LDLCALC 154 (H) 05/13/2022 1539   LABVLDL 18 05/13/2022 1539     Lab Results  Component Value Date   TSH 0.025 (L) 08/05/2022   TSH 8.650 (H) 05/13/2022   TSH 6.940 (H) 03/19/2022   TSH 3.980 12/01/2021   TSH 4.890 (H) 08/05/2021   TSH 18.700 (H) 05/29/2021   TSH 19.900 (H) 03/31/2021   TSH 47.300 (H) 02/04/2021   TSH <0.005 (L) 12/31/2020   TSH <0.005 (L) 10/07/2020   FREET4 2.40 (H) 08/05/2022   FREET4 1.37 05/13/2022   FREET4 1.37 03/19/2022   FREET4 1.63 12/01/2021   FREET4 1.30 08/05/2021   FREET4 0.88 05/29/2021   FREET4 0.95 03/31/2021   FREET4 0.13 (L) 02/04/2021   FREET4 1.28 12/31/2020   FREET4 1.84 (H) 10/07/2020     Uptake and Scan from 10/17/20 CLINICAL DATA:  Hyperthyroidism, TSH 0.450   EXAM: THYROID SCAN AND UPTAKE - 4 AND 24 HOURS   TECHNIQUE: Following oral administration of I-123 capsule, anterior planar imaging was acquired at 24 hours. Thyroid uptake was calculated with a thyroid probe at 4-6 hours and 24 hours.   RADIOPHARMACEUTICALS:  436 uCi I-123 sodium iodide p.o.   COMPARISON:  None   FINDINGS: Homogeneous tracer distribution in both thyroid lobes.   No focal areas of increased or decreased tracer localization.   4 hour I-123 uptake = 14.1% (normal 5-20%)   24 hour I-123 uptake = 31.1% (normal 10-30%)   IMPRESSION: Normal thyroid scan.   Minimally elevated 24 hour radio iodine uptake.   In the setting of  hyperthyroidism, findings suggest Graves disease     Electronically Signed   By: Ulyses Southward M.D.   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  -She underwent RAI ablation on 11/15/20.    Her previsit thyroid function tests are consistent with over-replacement.  She is advised to lower her Levothyroxine to 175 mcg po daily before breakfast.    - The correct intake of thyroid hormone (Levothyroxine, Synthroid), is on empty stomach first thing in the morning, with water, separated by at least 30 minutes from breakfast and other medications,  and separated by more than 4 hours from calcium, iron, multivitamins, acid reflux medications (PPIs).  - This medication is a life-long medication and will be needed to correct thyroid hormone imbalances for the rest of your life.  The dose may change from time to time, based on thyroid blood work.  - It  is extremely important to be consistent taking this medication, near the same time each morning.  -AVOID TAKING PRODUCTS CONTAINING BIOTIN (commonly found in Hair, Skin, Nails vitamins) AS IT INTERFERES WITH THE VALIDITY OF THYROID FUNCTION BLOOD TESTS.          -Patient is advised to maintain close follow up with Gilmore Laroche, FNP for primary care needs.    I spent  33  minutes in the care of the patient today including review of labs from Thyroid Function, CMP, and other relevant labs ; imaging/biopsy records (current and previous including abstractions from other facilities); face-to-face time discussing   her lab results and symptoms, medications doses, her options of short and long term treatment based on the latest standards of care / guidelines;   and documenting the encounter.  Collie Siad Coe  participated in the discussions, expressed understanding, and voiced agreement with the above plans.  All questions were answered to her satisfaction. she is encouraged to contact clinic should she have any questions or concerns prior to her return visit.    Follow up plan: Return in about 3 months (around 11/10/2022) for Thyroid follow up, Previsit labs.   Thank you for involving me in the care of this pleasant patient, and I will continue to update you with her progress.   Ronny Bacon, [facility] [address] [place], [place] [address] Phone: [phone] Fax: [phone]  08/10/2022, 8:59 AM

## 2022-08-10 NOTE — Patient Instructions (Signed)

## 2022-08-10 NOTE — Transitions of Care (Post Inpatient/ED Visit) (Unsigned)
   08/10/2022  Name: Paula Myers MRN: [id number] DOB: [birth year]  Today's TOC FU Call Status: Today's TOC FU Call Status:: Unsuccessul Call (1st Attempt) Unsuccessful Call (1st Attempt) Date: 08/10/22  Attempted to reach the patient regarding the most recent Inpatient/ED visit.  Follow Up Plan: Additional outreach attempts will be made to reach the patient to complete the Transitions of Care (Post Inpatient/ED visit) call.   Signature   Woodfin Ganja LPN [facility] Nurse Health Advisor Direct Dial [phone]

## 2022-08-11 NOTE — Transitions of Care (Post Inpatient/ED Visit) (Signed)
   08/11/2022  Name: ELIF YONTS MRN: [id number] DOB: [birth year]  Pt has been dismissed- opened in error   Woodfin Ganja LPN [facility] Nurse Health Advisor Direct Dial [phone]

## 2022-08-12 ENCOUNTER — Ambulatory Visit: Payer: Commercial Managed Care - PPO | Admitting: Family Medicine

## 2022-09-08 ENCOUNTER — Other Ambulatory Visit: Payer: Self-pay | Admitting: Family Medicine

## 2022-09-08 DIAGNOSIS — L700 Acne vulgaris: Secondary | ICD-10-CM

## 2022-09-09 MED ORDER — BENZOYL PEROXIDE-ERYTHROMYCIN 5-3 % EX GEL
Freq: Two times a day (BID) | CUTANEOUS | 0 refills | Status: DC
Start: 2022-09-09 — End: 2023-09-15

## 2022-09-09 MED ORDER — TRETINOIN 0.025 % EX CREA
TOPICAL_CREAM | Freq: Every day | CUTANEOUS | 0 refills | Status: AC
Start: 2022-09-09 — End: ?

## 2022-09-11 ENCOUNTER — Encounter: Payer: Self-pay | Admitting: Family Medicine

## 2022-09-11 ENCOUNTER — Ambulatory Visit (INDEPENDENT_AMBULATORY_CARE_PROVIDER_SITE_OTHER): Payer: Commercial Managed Care - PPO | Admitting: Family Medicine

## 2022-09-11 VITALS — BP 136/86 | HR 105 | Ht 65.0 in | Wt 245.0 lb

## 2022-09-11 DIAGNOSIS — M5432 Sciatica, left side: Secondary | ICD-10-CM | POA: Diagnosis not present

## 2022-09-11 DIAGNOSIS — F411 Generalized anxiety disorder: Secondary | ICD-10-CM | POA: Diagnosis not present

## 2022-09-11 MED ORDER — CYCLOBENZAPRINE HCL 5 MG PO TABS
5.0000 mg | ORAL_TABLET | Freq: Three times a day (TID) | ORAL | 1 refills | Status: DC | PRN
Start: 2022-09-11 — End: 2023-02-04

## 2022-09-11 MED ORDER — WEGOVY 0.5 MG/0.5ML ~~LOC~~ SOAJ
0.5000 mg | SUBCUTANEOUS | 0 refills | Status: DC
Start: 2022-09-11 — End: 2022-10-01

## 2022-09-11 MED ORDER — BUSPIRONE HCL 10 MG PO TABS
10.0000 mg | ORAL_TABLET | Freq: Two times a day (BID) | ORAL | 1 refills | Status: DC
Start: 2022-09-11 — End: 2022-10-23

## 2022-09-11 MED ORDER — NAPROXEN 500 MG PO TABS
500.0000 mg | ORAL_TABLET | Freq: Every day | ORAL | 0 refills | Status: DC
Start: 2022-09-11 — End: 2022-09-29

## 2022-09-11 NOTE — Progress Notes (Addendum)
Established Patient Office Visit  Subjective:  Patient ID: Paula Myers, female    DOB: October 31, 1985  Age: 37 y.o. MRN: [id number]  CC:  Chief Complaint  Patient presents with   Care Management    1 month f/u, would like to increase wegovy.   Fall    Pt reports a fall about a month ago 08/12/22 and has been hurting from her back down her left leg.    Anxiety    States buspar is not helping much.    HPI Paula Myers is a 37 y.o. female presents for anxiety f/u.  For the details of today's visit, please refer to the assessment and plan.      Wt Readings from Last 3 Encounters:  09/11/22 245 lb 0.6 oz (111.1 kg)  08/10/22 243 lb 3.2 oz (110.3 kg)  08/08/22 244 lb 11.4 oz (111 kg)    Past Medical History:  Diagnosis Date   Anemia    prior pregnancies   Anxiety    Depression    Dysrhythmia    "skipping" HR at beginning of pregnancy   Encounter for annual general medical examination with abnormal findings in adult 12/18/2021   Encounter for general adult medical examination with abnormal findings 10/07/2020   H/O cervical polypectomy    Heartburn during pregnancy    History of cesarean section 08/22/2014   Hypertension 2005   while in labor   Hyperthyroidism    Iron deficiency anemia due to chronic blood loss 11/21/2020   Kidney stone    Need for immunization against influenza 12/18/2021   Palpitation    Screening due 10/07/2020   SVT (supraventricular tachycardia)    SVT (supraventricular tachycardia)    Tachycardia    Wound, open, nose 10/07/2020    Past Surgical History:  Procedure Laterality Date   ABDOMINAL HYSTERECTOMY N/A 05/13/2021   Procedure: HYSTERECTOMY ABDOMINAL;  Surgeon: Myna Hidalgo, DO;  Location: [facility];  Service: Gynecology;  Laterality: N/A;   CESAREAN SECTION     06/11/09; 05/25/2003   CESAREAN SECTION WITH BILATERAL TUBAL LIGATION Bilateral 08/22/2014   Procedure: CESAREAN SECTION WITH BILATERAL TUBAL LIGATION;  Surgeon: Essie Hart, MD;  Location: [facility];  Service: Obstetrics;  Laterality: Bilateral;   DILATION AND CURETTAGE OF UTERUS  01/2008   ELBOW SURGERY     Left    Family History  Problem Relation Age of Onset   Cancer Paternal Grandfather        thyroid   Cancer Paternal Grandmother        ovarian   Diabetes Maternal Grandmother    Hypertension Maternal Grandmother    Heart disease Maternal Grandmother    Hypertension Mother    Diabetes Mother    Diabetes Brother     Social History   Socioeconomic History   Marital status: Married    Spouse name: Not on file   Number of children: 3   Years of education: Not on file   Highest education level: Associate degree: occupational, Scientist, product/process development, or vocational program  Occupational History   Occupation: [employer]    Comment: Dispatcher  Tobacco Use   Smoking status: Every Day    Current packs/day: 0.00    Average packs/day: 0.3 packs/day for 20.0 years (5.0 ttl pk-yrs)    Types: Cigarettes, E-cigarettes    Start date: 04/10/2002    Last attempt to quit: 04/10/2022    Years since quitting: 0.4   Smokeless tobacco: Never   Tobacco comments:  Quit cigarettes in February 2024. Vape cartridge currently lasts 1 month at a time  Vaping Use   Vaping status: Former  Substance and Sexual Activity   Alcohol use: Not Currently    Comment: maybe 1-2 per year   Drug use: Never   Sexual activity: Yes    Birth control/protection: Surgical    Comment: hyst  Other Topics Concern   Not on file  Social History Narrative   Not on file   Social Determinants of Health   Financial Resource Strain: Low Risk  (08/05/2022)   Overall Financial Resource Strain (CARDIA)    Difficulty of Paying Living Expenses: Not hard at all  Food Insecurity: No Food Insecurity (08/05/2022)   Hunger Vital Sign    Worried About Running Out of Food in the Last Year: Never true    Ran Out of Food in the Last Year: Never true  Transportation Needs: No Transportation Needs  (08/05/2022)   PRAPARE - Administrator, Civil Service (Medical): No    Lack of Transportation (Non-Medical): No  Physical Activity: Sufficiently Active (08/05/2022)   Exercise Vital Sign    Days of Exercise per Week: 3 days    Minutes of Exercise per Session: 120 min  Stress: Stress Concern Present (08/05/2022)   [employer] of Occupational Health - Occupational Stress Questionnaire    Feeling of Stress : Very much  Social Connections: Moderately Isolated (08/05/2022)   Social Connection and Isolation Panel [NHANES]    Frequency of Communication with Friends and Family: Three times a week    Frequency of Social Gatherings with Friends and Family: Never    Attends Religious Services: Never    Database administrator or Organizations: No    Attends Engineer, structural: Not on file    Marital Status: Married  Catering manager Violence: Not At Risk (03/14/2021)   Humiliation, Afraid, Rape, and Kick questionnaire    Fear of Current or Ex-Partner: No    Emotionally Abused: No    Physically Abused: No    Sexually Abused: No    Outpatient Medications Prior to Visit  Medication Sig Dispense Refill   acetaminophen (TYLENOL) 325 MG tablet Take 2 tablets (650 mg total) by mouth every 6 (six) hours as needed for moderate pain or mild pain.     amLODipine (NORVASC) 5 MG tablet Take 1 tablet (5 mg total) by mouth daily. 30 tablet 1   benzoyl peroxide-erythromycin (BENZAMYCIN) gel Apply topically 2 (two) times daily. 23.3 g 0   hydrochlorothiazide (HYDRODIURIL) 12.5 MG tablet Take 1 tablet (12.5 mg total) by mouth daily. 30 tablet 1   ibuprofen (ADVIL) 600 MG tablet Take 1 tablet (600 mg total) by mouth every 6 (six) hours as needed. 30 tablet 0   levothyroxine (SYNTHROID) 175 MCG tablet Take 1 tablet (175 mcg total) by mouth daily before breakfast. 90 tablet 1   tretinoin (RETIN-A) 0.025 % cream Apply topically at bedtime. 45 g 0   busPIRone (BUSPAR) 7.5 MG tablet Take 1 tablet  (7.5 mg total) by mouth 2 (two) times daily. 60 tablet 1   nystatin (MYCOSTATIN/NYSTOP) powder Apply 1 application. topically 3 (three) times daily. 15 g 0   phentermine (ADIPEX-P) 37.5 MG tablet Take 1 tablet (37.5 mg total) by mouth daily before breakfast. 30 tablet 0   Semaglutide-Weight Management (WEGOVY) 0.25 MG/0.5ML SOAJ Inject 0.25 mg into the skin once a week. 2 mL 0   DULoxetine (CYMBALTA) 30 MG capsule Take 1  capsule (30 mg total) by mouth daily. (Patient not taking: Reported on 08/10/2022) 30 capsule 3   No facility-administered medications prior to visit.    Allergies  Allergen Reactions   Levaquin [Levofloxacin In D5w] Anaphylaxis   Levofloxacin Anaphylaxis   Pertussis Vaccines Swelling    T-Dap injection caused localized swelling of arm. Has had Tetanus before without problems.   Megace [Megestrol] Other (See Comments)    Cause Depression   Tetanus-Diphth-Acell Pertussis Swelling   Venofer [Iron Sucrose] Other (See Comments)    Chest Pain    ROS Review of Systems  Constitutional:  Negative for chills and fever.  Eyes:  Negative for visual disturbance.  Respiratory:  Negative for chest tightness and shortness of breath.   Musculoskeletal:        C/o pain from the left buttock radiating to the left lower extremity   Neurological:  Negative for dizziness and headaches.      Objective:    Physical Exam HENT:     Head: Normocephalic.     Mouth/Throat:     Mouth: Mucous membranes are moist.  Cardiovascular:     Rate and Rhythm: Normal rate.     Heart sounds: Normal heart sounds.  Pulmonary:     Effort: Pulmonary effort is normal.     Breath sounds: Normal breath sounds.  Neurological:     Mental Status: She is alert.     BP 136/86 (BP Location: Left Arm)   Pulse (!) 105   Ht 5\' 5"  (1.651 m)   Wt 245 lb 0.6 oz (111.1 kg)   LMP 04/30/2021 Comment: Negative HCG on 05/09/2021  SpO2 98%   BMI 40.78 kg/m  Wt Readings from Last 3 Encounters:  09/11/22 245  lb 0.6 oz (111.1 kg)  08/10/22 243 lb 3.2 oz (110.3 kg)  08/08/22 244 lb 11.4 oz (111 kg)    Lab Results  Component Value Date   TSH 0.025 (L) 08/05/2022   Lab Results  Component Value Date   WBC 12.4 (H) 08/08/2022   HGB 16.0 (H) 08/08/2022   HCT 45.7 08/08/2022   MCV 83.9 08/08/2022   PLT 336 08/08/2022   Lab Results  Component Value Date   NA 135 08/08/2022   K 3.6 08/08/2022   CO2 25 08/08/2022   GLUCOSE 92 08/08/2022   BUN 11 08/08/2022   CREATININE 0.82 08/08/2022   BILITOT 0.6 05/13/2022   ALKPHOS 81 05/13/2022   AST 18 05/13/2022   ALT 29 05/13/2022   PROT 7.4 05/13/2022   ALBUMIN 4.8 05/13/2022   CALCIUM 9.3 08/08/2022   ANIONGAP 10 08/08/2022   EGFR 97 05/13/2022   GFR 102.38 01/15/2014   Lab Results  Component Value Date   CHOL 220 (H) 05/13/2022   Lab Results  Component Value Date   HDL 48 05/13/2022   Lab Results  Component Value Date   LDLCALC 154 (H) 05/13/2022   Lab Results  Component Value Date   TRIG 99 05/13/2022   Lab Results  Component Value Date   CHOLHDL 4.6 (H) 05/13/2022   Lab Results  Component Value Date   HGBA1C 5.3 05/13/2022      Assessment & Plan:  Generalized anxiety disorder Assessment & Plan: She will be following up with Dr.Stinson next week She has failed multiple SSRI and SNRI and reports elevated BP with Cymbalta The patient would be a great candidate for [facility] psychotropic testing Encouraged to discuss with Dr. Ronaldo Miyamoto during her visit She reports some relief,  however noted contributing factors like lack of sleep which could be contributing to her increased anxiety She reported that her husband will be working  day shift soon which will help ease her anxiety Will increased her buspar today Discussed nonpharmoclogical ways to decreased anxiety   Orders: -     busPIRone HCl; Take 1 tablet (10 mg total) by mouth 2 (two) times daily.  Dispense: 30 tablet; Refill: 1  Sciatica of left side Assessment  & Plan: The patient is having flair up of her sciatica since her recent fall on her buttocks No numbness, tingling or weakness reported C/o a sharp pain that starts in her left buttocks radiating down her left lower extremity Will treat today with a short supply of naproxen and flexeril Encouraged to f/u if unrelieved or symptom worsens   Orders: -     Cyclobenzaprine HCl; Take 1 tablet (5 mg total) by mouth 3 (three) times daily as needed for muscle spasms.  Dispense: 30 tablet; Refill: 1  Morbid obesity (HCC) Assessment & Plan: Will increased wegovy today Encouraged a hearth healthy diet with increased physical activity Wt Readings from Last 3 Encounters:  09/11/22 245 lb 0.6 oz (111.1 kg)  08/10/22 243 lb 3.2 oz (110.3 kg)  08/08/22 244 lb 11.4 oz (111 kg)      Note: This chart has been completed using Engineer, civil (consulting) software, and while attempts have been made to ensure accuracy, certain words and phrases may not be transcribed as intended.    Follow-up: Return in about 3 months (around 12/12/2022).   Gilmore Laroche, FNP

## 2022-09-11 NOTE — Patient Instructions (Addendum)
I appreciate the opportunity to provide care to you today!    Follow up:  3 months  Nonpharmacologic management of anxiety and depression  Mindfulness and Meditation Practices like mindfulness meditation can help reduce symptoms by promoting relaxation and present-moment awareness.  Exercise  Regular physical activity has been shown to improve mood and reduce anxiety through the release of endorphins and other neurochemicals.  Healthy Diet Eating a balanced diet rich in fruits, vegetables, whole grains, and lean proteins can support overall mental health.  Sleep Hygiene  Establishing a regular sleep routine and ensuring good sleep quality can significantly impact mood and anxiety levels.  Stress Management Techniques Activities such as yoga, tai chi, and deep breathing exercises can help manage stress.  Social Support Maintaining strong relationships and seeking support from friends, family, or support groups can provide emotional comfort and reduce feelings of isolation.  Lifestyle Modifications Reducing alcohol and caffeine intake, quitting smoking, and avoiding recreational drugs can improve symptoms.  Art and Music Therapy Engaging in creative activities like painting, drawing, or playing music can be therapeutic and help express emotions.  Light Therapy Particularly useful for seasonal affective disorder (SAD), exposure to bright light can help regulate mood. .   Sciatica flair up I recommend stretching exercises Please take your medications as prescribed I recommend taking flexeril at bedtime  Attached with your AVS, you will find valuable resources for self-education. I highly recommend dedicating some time to thoroughly examine them.   Please continue to a heart-healthy diet and increase your physical activities. Try to exercise for at least five days a week.    It was a pleasure to see you and I look forward to continuing to work together on your health and  well-being. Please do not hesitate to call the office if you need care or have questions about your care.  In case of emergency, please visit the Emergency Department for urgent care, or contact our clinic at [phone] to schedule an appointment. We're here to help you!   Have a wonderful day and week. With Gratitude, Gilmore Laroche MSN, FNP-BC

## 2022-09-12 DIAGNOSIS — M5430 Sciatica, unspecified side: Secondary | ICD-10-CM | POA: Insufficient documentation

## 2022-09-12 NOTE — Assessment & Plan Note (Signed)
The patient is having flair up of her sciatica since her recent fall on her buttocks No numbness, tingling or weakness reported C/o a sharp pain that starts in her left buttocks radiating down her left lower extremity Will treat today with a short supply of naproxen and flexeril Encouraged to f/u if unrelieved or symptom worsens

## 2022-09-12 NOTE — Assessment & Plan Note (Signed)
Will increased wegovy today Encouraged a hearth healthy diet with increased physical activity Wt Readings from Last 3 Encounters:  09/11/22 245 lb 0.6 oz (111.1 kg)  08/10/22 243 lb 3.2 oz (110.3 kg)  08/08/22 244 lb 11.4 oz (111 kg)

## 2022-09-12 NOTE — Assessment & Plan Note (Addendum)
She will be following up with Dr.Stinson next week She has failed multiple SSRI and SNRI and reports elevated BP with Cymbalta The patient would be a great candidate for [facility] psychotropic testing Encouraged to discuss with Dr. Ronaldo Miyamoto during her visit She reports some relief, however noted contributing factors like lack of sleep which could be contributing to her increased anxiety She reported that her husband will be working  day shift soon which will help ease her anxiety Will increased her buspar today Discussed nonpharmoclogical ways to decreased anxiety

## 2022-09-15 ENCOUNTER — Encounter (HOSPITAL_COMMUNITY): Payer: Self-pay | Admitting: Psychiatry

## 2022-09-15 ENCOUNTER — Ambulatory Visit (INDEPENDENT_AMBULATORY_CARE_PROVIDER_SITE_OTHER): Payer: Commercial Managed Care - PPO | Admitting: Psychiatry

## 2022-09-15 DIAGNOSIS — Z789 Other specified health status: Secondary | ICD-10-CM | POA: Insufficient documentation

## 2022-09-15 DIAGNOSIS — E559 Vitamin D deficiency, unspecified: Secondary | ICD-10-CM

## 2022-09-15 DIAGNOSIS — F411 Generalized anxiety disorder: Secondary | ICD-10-CM

## 2022-09-15 DIAGNOSIS — Z720 Tobacco use: Secondary | ICD-10-CM | POA: Diagnosis not present

## 2022-09-15 DIAGNOSIS — M5432 Sciatica, left side: Secondary | ICD-10-CM

## 2022-09-15 DIAGNOSIS — F410 Panic disorder [episodic paroxysmal anxiety] without agoraphobia: Secondary | ICD-10-CM

## 2022-09-15 DIAGNOSIS — F4310 Post-traumatic stress disorder, unspecified: Secondary | ICD-10-CM

## 2022-09-15 DIAGNOSIS — G4726 Circadian rhythm sleep disorder, shift work type: Secondary | ICD-10-CM | POA: Insufficient documentation

## 2022-09-15 DIAGNOSIS — G4709 Other insomnia: Secondary | ICD-10-CM

## 2022-09-15 DIAGNOSIS — F331 Major depressive disorder, recurrent, moderate: Secondary | ICD-10-CM

## 2022-09-15 HISTORY — DX: Other specified health status: Z78.9

## 2022-09-15 MED ORDER — LAMOTRIGINE 25 MG PO TABS
ORAL_TABLET | ORAL | 0 refills | Status: DC
Start: 2022-09-15 — End: 2022-10-23

## 2022-09-15 MED ORDER — PROPRANOLOL HCL 10 MG PO TABS
10.0000 mg | ORAL_TABLET | Freq: Two times a day (BID) | ORAL | 1 refills | Status: DC | PRN
Start: 2022-09-15 — End: 2022-09-16
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
## 2022-09-15 NOTE — Addendum Note (Signed)
Addended by: Tia Masker on: 09/15/2022 02:03 PM   Modules accepted: Orders

## 2022-09-15 NOTE — Patient Instructions (Addendum)
We made several changes to your medications today.  Decrease BuSpar to 5 mg twice daily for 1 week and then discontinue.  We added Lamictal (lamotrigine) 25 mg nightly to your regimen today.  Take this dose for 2 weeks then increase to 50 mg nightly for 2 weeks and increase to 75 mg nightly for 2 weeks and we should have our next appointment around that time.  With the rash that you described before it is unclear if this was due to the Lamictal or something else.  If you do notice a repeat rash of any kind on this medicine stop the medication immediately and let our office know.  We also added propranolol 10 mg twice daily as needed for panic today.  I will coordinate with your PCP to get an updated vitamin D, B12, folate level.  Do reach back out to your nutritionist to see them again.

## 2022-09-16 MED ORDER — PROPRANOLOL HCL 10 MG PO TABS: 10.0000 mg | ORAL_TABLET | Freq: Two times a day (BID) | ORAL | 0 refills | Status: DC | PRN

## 2022-09-16 NOTE — Addendum Note (Signed)
Addended by: Tia Masker on: 09/16/2022 09:54 AM   Modules accepted: Orders

## 2022-09-29 ENCOUNTER — Other Ambulatory Visit: Payer: Self-pay | Admitting: Family Medicine

## 2022-09-29 DIAGNOSIS — M5432 Sciatica, left side: Secondary | ICD-10-CM

## 2022-10-01 ENCOUNTER — Other Ambulatory Visit: Payer: Self-pay | Admitting: Family Medicine

## 2022-10-01 ENCOUNTER — Encounter: Payer: Self-pay | Admitting: Family Medicine

## 2022-10-01 MED ORDER — WEGOVY 1 MG/0.5ML ~~LOC~~ SOAJ
1.0000 mg | SUBCUTANEOUS | 0 refills | Status: DC
Start: 2022-10-01 — End: 2022-11-04

## 2022-10-05 ENCOUNTER — Other Ambulatory Visit: Payer: Self-pay | Admitting: Family Medicine

## 2022-10-05 DIAGNOSIS — I10 Essential (primary) hypertension: Secondary | ICD-10-CM

## 2022-10-21 ENCOUNTER — Telehealth (HOSPITAL_COMMUNITY): Payer: Self-pay | Admitting: Psychiatry

## 2022-10-21 NOTE — Telephone Encounter (Signed)
Called to confirm appointment with Dr Adrian Blackwater on 08/23. There was no answer and I left a message.

## 2022-10-23 ENCOUNTER — Other Ambulatory Visit: Payer: Self-pay | Admitting: Family Medicine

## 2022-10-23 ENCOUNTER — Telehealth (HOSPITAL_COMMUNITY): Payer: Commercial Managed Care - PPO | Admitting: Psychiatry

## 2022-10-23 ENCOUNTER — Encounter (HOSPITAL_COMMUNITY): Payer: Self-pay | Admitting: Psychiatry

## 2022-10-23 DIAGNOSIS — F411 Generalized anxiety disorder: Secondary | ICD-10-CM

## 2022-10-23 DIAGNOSIS — Z789 Other specified health status: Secondary | ICD-10-CM

## 2022-10-23 DIAGNOSIS — F17290 Nicotine dependence, other tobacco product, uncomplicated: Secondary | ICD-10-CM

## 2022-10-23 DIAGNOSIS — F4310 Post-traumatic stress disorder, unspecified: Secondary | ICD-10-CM

## 2022-10-23 DIAGNOSIS — E559 Vitamin D deficiency, unspecified: Secondary | ICD-10-CM

## 2022-10-23 DIAGNOSIS — G4726 Circadian rhythm sleep disorder, shift work type: Secondary | ICD-10-CM | POA: Diagnosis not present

## 2022-10-23 DIAGNOSIS — F331 Major depressive disorder, recurrent, moderate: Secondary | ICD-10-CM | POA: Diagnosis not present

## 2022-10-23 DIAGNOSIS — Z720 Tobacco use: Secondary | ICD-10-CM

## 2022-10-23 DIAGNOSIS — M5432 Sciatica, left side: Secondary | ICD-10-CM

## 2022-10-23 DIAGNOSIS — Z6981 Encounter for mental health services for victim of other abuse: Secondary | ICD-10-CM | POA: Insufficient documentation

## 2022-10-23 DIAGNOSIS — F410 Panic disorder [episodic paroxysmal anxiety] without agoraphobia: Secondary | ICD-10-CM

## 2022-10-23 DIAGNOSIS — E669 Obesity, unspecified: Secondary | ICD-10-CM

## 2022-10-23 DIAGNOSIS — E079 Disorder of thyroid, unspecified: Secondary | ICD-10-CM

## 2022-10-23 DIAGNOSIS — G4709 Other insomnia: Secondary | ICD-10-CM

## 2022-10-23 MED ORDER — LAMOTRIGINE 100 MG PO TABS: 100.0000 mg | ORAL_TABLET | Freq: Every day | ORAL | 2 refills | Status: DC
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
## 2022-10-26 ENCOUNTER — Ambulatory Visit
Admission: EM | Admit: 2022-10-26 | Discharge: 2022-10-26 | Disposition: A | Discharge status: Home / Self Care | Payer: Commercial Managed Care - PPO | Attending: Nurse Practitioner | Admitting: Nurse Practitioner

## 2022-10-26 ENCOUNTER — Other Ambulatory Visit (HOSPITAL_COMMUNITY): Payer: Self-pay | Admitting: Psychiatry

## 2022-10-26 DIAGNOSIS — Z87442 Personal history of urinary calculi: Secondary | ICD-10-CM | POA: Insufficient documentation

## 2022-10-26 DIAGNOSIS — R399 Unspecified symptoms and signs involving the genitourinary system: Secondary | ICD-10-CM | POA: Diagnosis present

## 2022-10-26 DIAGNOSIS — F331 Major depressive disorder, recurrent, moderate: Secondary | ICD-10-CM

## 2022-10-26 LAB — POCT URINALYSIS DIP (MANUAL ENTRY)
Bilirubin, UA: NEGATIVE
Glucose, UA: NEGATIVE mg/dL
Ketones, POC UA: NEGATIVE mg/dL
Leukocytes, UA: NEGATIVE
Nitrite, UA: NEGATIVE
Protein Ur, POC: NEGATIVE mg/dL
Spec Grav, UA: 1.015 (ref 1.010–1.025)
Urobilinogen, UA: 0.2 E.U./dL
pH, UA: 6 (ref 5.0–8.0)

## 2022-10-26 MED ORDER — TAMSULOSIN HCL 0.4 MG PO CAPS: 0.4000 mg | ORAL_CAPSULE | Freq: Every day | ORAL | 0 refills | Status: DC

## 2022-10-26 MED ORDER — NITROFURANTOIN MONOHYD MACRO 100 MG PO CAPS: 100.0000 mg | ORAL_CAPSULE | Freq: Two times a day (BID) | ORAL | 0 refills | Status: DC

## 2022-10-26 NOTE — ED Triage Notes (Signed)
Pt c/o UTI sx's, dark colored urine, peeing blood today and back pain, urgency, x 1 day pt has a Hx of Kidney stones
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
## 2022-10-26 NOTE — Discharge Instructions (Addendum)
The urinalysis suggest that you may have a urinary tract infection.  As discussed, I am going to start you on antibiotics.  A urine culture has also been ordered.  If the result of the urine culture shows that you do not have a urinary tract infection, you will be contacted and advised to stop the antibiotic.  Also if the culture does show a urinary tract infection and the medication needs to be changed, you will be contacted. Make sure you are drinking plenty of fluids, preferably 8-10 8 ounce glass of water daily. May take over-the-counter Tylenol or ibuprofen as needed for pain, fever, or general discomfort. Develop a toileting schedule that will allow you to urinate every 2 hours. Strain your urine each time you use the restroom to determine if you have passed a kidney stone. Avoid caffeine such as tea, soda, or coffee while symptoms persist. If you develop worsening symptoms with fever, chills, or other concerns, please go to the emergency department immediately for further evaluation. As discussed, I am providing you information for follow-up with urology if symptoms continue to persist. Follow-up as needed.

## 2022-10-27 LAB — URINE CULTURE: Culture: NO GROWTH

## 2022-11-04 ENCOUNTER — Other Ambulatory Visit: Payer: Self-pay | Admitting: Family Medicine

## 2022-11-04 ENCOUNTER — Encounter: Payer: Self-pay | Admitting: Family Medicine

## 2022-11-04 MED ORDER — WEGOVY 1.7 MG/0.75ML ~~LOC~~ SOAJ
1.7000 mg | SUBCUTANEOUS | 0 refills | Status: AC
Start: 2022-11-04 — End: ?

## 2022-11-06 ENCOUNTER — Encounter: Payer: Self-pay | Admitting: Family Medicine

## 2022-11-06 ENCOUNTER — Ambulatory Visit (INDEPENDENT_AMBULATORY_CARE_PROVIDER_SITE_OTHER): Payer: Commercial Managed Care - PPO | Admitting: Family Medicine

## 2022-11-06 VITALS — BP 140/86 | HR 103 | Ht 65.0 in | Wt 243.1 lb

## 2022-11-06 DIAGNOSIS — R109 Unspecified abdominal pain: Secondary | ICD-10-CM | POA: Insufficient documentation

## 2022-11-06 DIAGNOSIS — E7849 Other hyperlipidemia: Secondary | ICD-10-CM

## 2022-11-06 DIAGNOSIS — R10A0 Flank pain, unspecified side: Secondary | ICD-10-CM

## 2022-11-06 DIAGNOSIS — I10 Essential (primary) hypertension: Secondary | ICD-10-CM

## 2022-11-06 DIAGNOSIS — R7301 Impaired fasting glucose: Secondary | ICD-10-CM | POA: Diagnosis not present

## 2022-11-06 DIAGNOSIS — E559 Vitamin D deficiency, unspecified: Secondary | ICD-10-CM

## 2022-11-06 DIAGNOSIS — E038 Other specified hypothyroidism: Secondary | ICD-10-CM

## 2022-11-06 MED ORDER — HYDROCHLOROTHIAZIDE 12.5 MG PO TABS: 12.5000 mg | ORAL_TABLET | Freq: Every day | ORAL | 1 refills | Status: DC

## 2022-11-06 MED ORDER — OXYCODONE-ACETAMINOPHEN 5-325 MG PO TABS
1.0000 | ORAL_TABLET | ORAL | 0 refills | Status: AC | PRN
Start: 2022-11-06 — End: 2022-11-11

## 2022-11-06 NOTE — Progress Notes (Signed)
Established Patient Office Visit  Subjective:  Patient ID: Paula Myers, female    DOB: [birth year]  Age: 37 y.o. MRN: [id number]  CC:  Chief Complaint  Patient presents with   Back Pain    Pt reports back pain. Went to urgent care on 10/26/22 due to same issue still not feeling better.     HPI Paula Myers is a 37 y.o. female presents for  ED f/u   Right Flank Pain: The patient was seen in the ED on 10/26/2022 with complaints of dark-colored urine, described as [employer] in appearance, along with back pain and urinary urgency. She denied fever, chills, chest pain, nausea, vomiting, and decreased urinary stream. A urinalysis was negative for leukocytes and nitrates; however, the patient was treated with Macrobid and informed she would be contacted if her urine culture was negative so she could discontinue the antibiotic. Her urine culture later returned negative, and she was advised to discontinue the antibiotic but to continue taking Flomax 0.4 mg daily to aid in the passage of a possible urinary stone.  The patient is seen in the clinic today with similar complaints, now primarily noting right flank pain. She reports a history of kidney stones and previously followed up with [facility] a few years ago for this issue. The pain in the clinic today is rated 5 out of 10, but she describes the severity as fluctuating between 8-9 out of 10. The pain is described as a stabbing sensation, similar to being probed with an electrical device.  Past Medical History:  Diagnosis Date   Anemia    prior pregnancies   Anxiety    Depression    Dysrhythmia    "skipping" HR at beginning of pregnancy   Encounter for annual general medical examination with abnormal findings in adult 12/18/2021   Encounter for general adult medical examination with abnormal findings 10/07/2020   H/O cervical polypectomy    Heartburn during pregnancy    History of cesarean section 08/22/2014    Hypertension 2005   while in labor   Hyperthyroidism    Iron deficiency anemia due to chronic blood loss 11/21/2020   Kidney stone    Need for immunization against influenza 12/18/2021   Palpitation    Screening due 10/07/2020   SVT (supraventricular tachycardia)    SVT (supraventricular tachycardia)    Tachycardia    Wound, open, nose 10/07/2020    Past Surgical History:  Procedure Laterality Date   ABDOMINAL HYSTERECTOMY N/A 05/13/2021   Procedure: HYSTERECTOMY ABDOMINAL;  Surgeon: Myna Hidalgo, DO;  Location: [facility];  Service: Gynecology;  Laterality: N/A;   CESAREAN SECTION     06/11/09; 05/25/2003   CESAREAN SECTION WITH BILATERAL TUBAL LIGATION Bilateral 08/22/2014   Procedure: CESAREAN SECTION WITH BILATERAL TUBAL LIGATION;  Surgeon: Essie Hart, MD;  Location: [facility];  Service: Obstetrics;  Laterality: Bilateral;   DILATION AND CURETTAGE OF UTERUS  01/2008   ELBOW SURGERY     Left    Family History  Problem Relation Age of Onset   Cancer Paternal Grandfather        thyroid   Cancer Paternal Grandmother        ovarian   Diabetes Maternal Grandmother    Hypertension Maternal Grandmother    Heart disease Maternal Grandmother    Hypertension Mother    Diabetes Mother    Diabetes Brother     Social History   Socioeconomic History   Marital status: Married    Spouse  name: Not on file   Number of children: 3   Years of education: Not on file   Highest education level: Associate degree: occupational, Scientist, product/process development, or vocational program  Occupational History   Occupation: [employer]    Comment: Dispatcher  Tobacco Use   Smoking status: Every Day    Current packs/day: 0.00    Average packs/day: 0.3 packs/day for 20.0 years (5.0 ttl pk-yrs)    Types: Cigarettes, E-cigarettes    Start date: 04/10/2002    Last attempt to quit: 04/10/2022    Years since quitting: 0.5   Smokeless tobacco: Never   Tobacco comments:    Quit cigarettes in February 2024. Vape  cartridge currently lasts 1 month at a time  Vaping Use   Vaping status: Former  Substance and Sexual Activity   Alcohol use: Not Currently    Comment: maybe 1-2 per year   Drug use: Never   Sexual activity: Yes    Birth control/protection: Surgical    Comment: hyst  Other Topics Concern   Not on file  Social History Narrative   Not on file   Social Determinants of Health   Financial Resource Strain: Low Risk  (08/05/2022)   Overall Financial Resource Strain (CARDIA)    Difficulty of Paying Living Expenses: Not hard at all  Food Insecurity: No Food Insecurity (08/05/2022)   Hunger Vital Sign    Worried About Running Out of Food in the Last Year: Never true    Ran Out of Food in the Last Year: Never true  Transportation Needs: No Transportation Needs (08/05/2022)   PRAPARE - Administrator, Civil Service (Medical): No    Lack of Transportation (Non-Medical): No  Physical Activity: Sufficiently Active (08/05/2022)   Exercise Vital Sign    Days of Exercise per Week: 3 days    Minutes of Exercise per Session: 120 min  Stress: Stress Concern Present (08/05/2022)   [employer] of Occupational Health - Occupational Stress Questionnaire    Feeling of Stress : Very much  Social Connections: Moderately Isolated (08/05/2022)   Social Connection and Isolation Panel [NHANES]    Frequency of Communication with Friends and Family: Three times a week    Frequency of Social Gatherings with Friends and Family: Never    Attends Religious Services: Never    Database administrator or Organizations: No    Attends Engineer, structural: Not on file    Marital Status: Married  Catering manager Violence: Not At Risk (03/14/2021)   Humiliation, Afraid, Rape, and Kick questionnaire    Fear of Current or Ex-Partner: No    Emotionally Abused: No    Physically Abused: No    Sexually Abused: No    Outpatient Medications Prior to Visit  Medication Sig Dispense Refill   acetaminophen  (TYLENOL) 325 MG tablet Take 2 tablets (650 mg total) by mouth every 6 (six) hours as needed for moderate pain or mild pain.     amLODipine (NORVASC) 5 MG tablet TAKE 1 TABLET(5 MG) BY MOUTH DAILY 30 tablet 1   benzoyl peroxide-erythromycin (BENZAMYCIN) gel Apply topically 2 (two) times daily. 23.3 g 0   cyclobenzaprine (FLEXERIL) 5 MG tablet Take 1 tablet (5 mg total) by mouth 3 (three) times daily as needed for muscle spasms. 30 tablet 1   ibuprofen (ADVIL) 600 MG tablet Take 1 tablet (600 mg total) by mouth every 6 (six) hours as needed. 30 tablet 0   lamoTRIgine (LAMICTAL) 100 MG  tablet Take 1 tablet (100 mg total) by mouth daily. After completing 75 mg dose. 30 tablet 2   levothyroxine (SYNTHROID) 175 MCG tablet Take 1 tablet (175 mcg total) by mouth daily before breakfast. 90 tablet 1   melatonin 5 MG TABS Take 5 mg by mouth at bedtime.     naproxen (NAPROSYN) 500 MG tablet TAKE 1 TABLET(500 MG) BY MOUTH DAILY 20 tablet 0   propranolol (INDERAL) 10 MG tablet Take 1 tablet (10 mg total) by mouth 2 (two) times daily as needed (panic). 180 tablet 0   Semaglutide-Weight Management (WEGOVY) 1.7 MG/0.75ML SOAJ Inject 1.7 mg into the skin once a week. 2 mL 0   tamsulosin (FLOMAX) 0.4 MG CAPS capsule Take 1 capsule (0.4 mg total) by mouth daily after supper. 30 capsule 0   tretinoin (RETIN-A) 0.025 % cream Apply topically at bedtime. 45 g 0   hydrochlorothiazide (HYDRODIURIL) 12.5 MG tablet Take 1 tablet (12.5 mg total) by mouth daily. 30 tablet 1   nitrofurantoin, macrocrystal-monohydrate, (MACROBID) 100 MG capsule Take 1 capsule (100 mg total) by mouth 2 (two) times daily. 10 capsule 0   No facility-administered medications prior to visit.    Allergies  Allergen Reactions   Levaquin [Levofloxacin In D5w] Anaphylaxis   Levofloxacin Anaphylaxis   Pertussis Vaccines Swelling    T-Dap injection caused localized swelling of arm. Has had Tetanus before without problems.   Megace [Megestrol] Other  (See Comments)    Cause Depression   Tetanus-Diphth-Acell Pertussis Swelling   Venofer [Iron Sucrose] Other (See Comments)    Chest Pain    ROS Review of Systems  Constitutional:  Negative for chills and fever.  Eyes:  Negative for visual disturbance.  Respiratory:  Negative for chest tightness and shortness of breath.   Genitourinary:  Positive for flank pain and hematuria.  Neurological:  Negative for dizziness and headaches.      Objective:    Physical Exam HENT:     Head: Normocephalic.     Mouth/Throat:     Mouth: Mucous membranes are moist.  Cardiovascular:     Rate and Rhythm: Normal rate.     Heart sounds: Normal heart sounds.  Pulmonary:     Effort: Pulmonary effort is normal.     Breath sounds: Normal breath sounds.  Abdominal:     Tenderness: There is right CVA tenderness. There is no left CVA tenderness.  Neurological:     Mental Status: She is alert.     BP (!) 140/86 (BP Location: Left Arm)   Pulse (!) 103   Ht 5\' 5"  (1.651 m)   Wt 243 lb 1.9 oz (110.3 kg)   LMP 04/30/2021 Comment: Negative HCG on 05/09/2021  SpO2 97%   BMI 40.46 kg/m  Wt Readings from Last 3 Encounters:  11/06/22 243 lb 1.9 oz (110.3 kg)  09/11/22 245 lb 0.6 oz (111.1 kg)  08/10/22 243 lb 3.2 oz (110.3 kg)    Lab Results  Component Value Date   TSH 0.025 (L) 08/05/2022   Lab Results  Component Value Date   WBC 12.4 (H) 08/08/2022   HGB 16.0 (H) 08/08/2022   HCT 45.7 08/08/2022   MCV 83.9 08/08/2022   PLT 336 08/08/2022   Lab Results  Component Value Date   NA 135 08/08/2022   K 3.6 08/08/2022   CO2 25 08/08/2022   GLUCOSE 92 08/08/2022   BUN 11 08/08/2022   CREATININE 0.82 08/08/2022   BILITOT 0.6 05/13/2022   ALKPHOS  81 05/13/2022   AST 18 05/13/2022   ALT 29 05/13/2022   PROT 7.4 05/13/2022   ALBUMIN 4.8 05/13/2022   CALCIUM 9.3 08/08/2022   ANIONGAP 10 08/08/2022   EGFR 97 05/13/2022   GFR 102.38 01/15/2014   Lab Results  Component Value Date   CHOL  220 (H) 05/13/2022   Lab Results  Component Value Date   HDL 48 05/13/2022   Lab Results  Component Value Date   LDLCALC 154 (H) 05/13/2022   Lab Results  Component Value Date   TRIG 99 05/13/2022   Lab Results  Component Value Date   CHOLHDL 4.6 (H) 05/13/2022   Lab Results  Component Value Date   HGBA1C 5.3 05/13/2022      Assessment & Plan:  Flank pain Assessment & Plan: A CT scan of the abdomen and pelvis will be ordered to assess for kidney stones. The patient is encouraged to take Percocet 5-325 mg every 4 hours as needed for pain relief. Nonpharmacological interventions were reviewed, including increasing fluid intake to at least 64 ounces daily, reducing sodium intake, reducing excessive animal protein, and limiting oxalate-rich foods. The patient verbalized understanding.  Orders: -     Urinalysis -     CT ABDOMEN PELVIS WO CONTRAST -     oxyCODONE-Acetaminophen; Take 1 tablet by mouth every 4 (four) hours as needed for up to 5 days for severe pain.  Dispense: 10 tablet; Refill: 0  Morbid obesity (HCC) -     B12 and Folate Panel  IFG (impaired fasting glucose) -     Hemoglobin A1c  Vitamin D deficiency -     VITAMIN D 25 Hydroxy (Vit-D Deficiency, Fractures)  Other specified hypothyroidism -     TSH + free T4  Other hyperlipidemia -     CMP14+EGFR -     CBC with Differential/Platelet -     CMP14+EGFR  Hypertension, unspecified type -     hydroCHLOROthiazide; Take 1 tablet (12.5 mg total) by mouth daily.  Dispense: 30 tablet; Refill: 1  Note: This chart has been completed using Engineer, civil (consulting) software, and while attempts have been made to ensure accuracy, certain words and phrases may not be transcribed as intended.    Follow-up: Return in about 3 months (around 02/05/2023).   Gilmore Laroche, FNP

## 2022-11-06 NOTE — Patient Instructions (Addendum)
I appreciate the opportunity to provide care to you today!    Follow up:  3 months  Labs: please stop by the lab today to get your blood drawn (CBC, CMP, TSH, HgA1c, Vit D)  Flank Pain Management -Orders have been placed for a CT scan of your abdomen to assess for kidney stones. For pain relief, take Percocet 5-325 as needed. Continue take Flomax 0.4 mg daily  Nonpharmacological Interventions for Kidney Stones  To manage kidney stones and prevent their formation, consider the following lifestyle and dietary modifications:  -Hydration: Increase Fluid Intake: Drink ample fluids, particularly water, to help dilute urine and facilitate stone passage. Aim for at least 8-10 cups of water daily. Monitor Urine Color: Ensure urine is light yellow to pale; darker urine suggests dehydration. Dietary Modifications -Reduce Sodium: Lower sodium intake to decrease calcium excretion in urine. Avoid high-sodium foods such as processed foods, canned soups, and salty snacks. -Moderate Protein Intake: Limit excessive animal protein, which can increase the risk of stone formation. Opt for a balanced diet with moderate protein from plant-based sources. -Limit Oxalate-Rich Foods: If prone to calcium oxalate stones, reduce intake of high-oxalate foods like spinach, nuts, chocolate, and tea. -Increase Citrate Intake: Incorporate citrus fruits like lemons and oranges into your diet to boost urine citrate levels, which helps inhibit stone growth.    Please continue to a heart-healthy diet and increase your physical activities. Try to exercise for at least five days a week.    It was a pleasure to see you and I look forward to continuing to work together on your health and well-being. Please do not hesitate to call the office if you need care or have questions about your care.  In case of emergency, please visit the Emergency Department for urgent care, or contact our clinic at [phone] to schedule an  appointment. We're here to help you!   Have a wonderful day and week. With Gratitude, Gilmore Laroche MSN, FNP-BC

## 2022-11-06 NOTE — Assessment & Plan Note (Addendum)
A CT scan of the abdomen and pelvis will be ordered to assess for kidney stones. The patient is encouraged to take Percocet 5-325 mg every 4 hours as needed for pain relief. Nonpharmacological interventions were reviewed, including increasing fluid intake to at least 64 ounces daily, reducing sodium intake, reducing excessive animal protein, and limiting oxalate-rich foods. The patient verbalized understanding.

## 2022-11-07 LAB — CBC WITH DIFFERENTIAL/PLATELET
Basophils Absolute: 0.1 10*3/uL (ref 0.0–0.2)
Basos: 1 %
EOS (ABSOLUTE): 0.1 10*3/uL (ref 0.0–0.4)
Eos: 1 %
Hematocrit: 46.4 % (ref 34.0–46.6)
Hemoglobin: 15.4 g/dL (ref 11.1–15.9)
Immature Grans (Abs): 0 10*3/uL (ref 0.0–0.1)
Immature Granulocytes: 0 %
Lymphocytes Absolute: 2.9 10*3/uL (ref 0.7–3.1)
Lymphs: 24 %
MCH: 29.1 pg (ref 26.6–33.0)
MCHC: 33.2 g/dL (ref 31.5–35.7)
MCV: 88 fL (ref 79–97)
Monocytes Absolute: 1 10*3/uL — ABNORMAL HIGH (ref 0.1–0.9)
Monocytes: 8 %
Neutrophils Absolute: 8.1 10*3/uL — ABNORMAL HIGH (ref 1.4–7.0)
Neutrophils: 66 %
Platelets: 335 10*3/uL (ref 150–450)
RBC: 5.3 x10E6/uL — ABNORMAL HIGH (ref 3.77–5.28)
RDW: 12.2 % (ref 11.7–15.4)
WBC: 12.2 10*3/uL — ABNORMAL HIGH (ref 3.4–10.8)

## 2022-11-07 LAB — VITAMIN D 25 HYDROXY (VIT D DEFICIENCY, FRACTURES): Vit D, 25-Hydroxy: 31.7 ng/mL (ref 30.0–100.0)

## 2022-11-07 LAB — CMP14+EGFR
ALT: 32 IU/L (ref 0–32)
AST: 20 IU/L (ref 0–40)
Albumin: 4.5 g/dL (ref 3.9–4.9)
Alkaline Phosphatase: 88 IU/L (ref 44–121)
BUN/Creatinine Ratio: 13 (ref 9–23)
BUN: 10 mg/dL (ref 6–20)
Bilirubin Total: 0.3 mg/dL (ref 0.0–1.2)
CO2: 24 mmol/L (ref 20–29)
Calcium: 9.6 mg/dL (ref 8.7–10.2)
Chloride: 103 mmol/L (ref 96–106)
Creatinine, Ser: 0.78 mg/dL (ref 0.57–1.00)
Globulin, Total: 3 g/dL (ref 1.5–4.5)
Glucose: 81 mg/dL (ref 70–99)
Potassium: 4.5 mmol/L (ref 3.5–5.2)
Sodium: 141 mmol/L (ref 134–144)
Total Protein: 7.5 g/dL (ref 6.0–8.5)
eGFR: 99 mL/min/{1.73_m2} (ref >59)

## 2022-11-07 LAB — URINALYSIS
Bilirubin, UA: NEGATIVE
Glucose, UA: NEGATIVE
Ketones, UA: NEGATIVE
Nitrite, UA: NEGATIVE
Specific Gravity, UA: 1.022 (ref 1.005–1.030)
Urobilinogen, Ur: 0.2 mg/dL (ref 0.2–1.0)
pH, UA: 5.5 (ref 5.0–7.5)

## 2022-11-07 LAB — TSH+FREE T4
Free T4: 1.69 ng/dL (ref 0.82–1.77)
TSH: 0.037 u[IU]/mL — ABNORMAL LOW (ref 0.450–4.500)

## 2022-11-07 LAB — HEMOGLOBIN A1C
Est. average glucose Bld gHb Est-mCnc: 103 mg/dL
Hgb A1c MFr Bld: 5.2 % (ref 4.8–5.6)

## 2022-11-08 ENCOUNTER — Other Ambulatory Visit: Payer: Self-pay | Admitting: Family Medicine

## 2022-11-08 DIAGNOSIS — E039 Hypothyroidism, unspecified: Secondary | ICD-10-CM

## 2022-11-08 MED ORDER — LEVOTHYROXINE SODIUM 150 MCG PO TABS
150.0000 ug | ORAL_TABLET | Freq: Every day | ORAL | 0 refills | Status: DC
Start: 2022-11-08 — End: 2022-11-11

## 2022-11-09 ENCOUNTER — Ambulatory Visit (HOSPITAL_COMMUNITY): Admission: RE | Admit: 2022-11-09 | Payer: Commercial Managed Care - PPO | Source: Ambulatory Visit

## 2022-11-09 ENCOUNTER — Encounter (HOSPITAL_COMMUNITY): Payer: Self-pay

## 2022-11-10 ENCOUNTER — Telehealth: Payer: Self-pay | Admitting: Family Medicine

## 2022-11-10 ENCOUNTER — Ambulatory Visit (HOSPITAL_COMMUNITY)
Admission: RE | Admit: 2022-11-10 | Discharge: 2022-11-10 | Disposition: A | Discharge status: Home / Self Care | Payer: Commercial Managed Care - PPO | Source: Ambulatory Visit | Attending: Family Medicine | Admitting: Family Medicine

## 2022-11-10 DIAGNOSIS — R109 Unspecified abdominal pain: Secondary | ICD-10-CM | POA: Insufficient documentation

## 2022-11-10 NOTE — Patient Instructions (Signed)

## 2022-11-10 NOTE — Telephone Encounter (Signed)
Received after call summary fax saying the patient could not do her CT scan scheduled for 09.09.2024 at [facility] was cancel due to no prior authorization. Called the patient and per patient she said the scheduling lady told her that the prior authorization was all good and could do the scan. Patient took off of work to do the scan and got to hospital and they told her could not do this due to no prior authorization received. .What does patient need to do since she took off of work and now could not do the scan.  Patient asked if clinical or referral department reach out to her about this CT Scan. Call # [phone].

## 2022-11-10 NOTE — Telephone Encounter (Signed)
Thank you so much for your assistance with this.

## 2022-11-11 ENCOUNTER — Ambulatory Visit (INDEPENDENT_AMBULATORY_CARE_PROVIDER_SITE_OTHER): Payer: Commercial Managed Care - PPO | Admitting: Nurse Practitioner

## 2022-11-11 ENCOUNTER — Ambulatory Visit (HOSPITAL_COMMUNITY): Payer: Commercial Managed Care - PPO

## 2022-11-11 ENCOUNTER — Encounter: Payer: Self-pay | Admitting: Nurse Practitioner

## 2022-11-11 VITALS — BP 126/80 | HR 89 | Ht 65.0 in | Wt 241.4 lb

## 2022-11-11 DIAGNOSIS — E890 Postprocedural hypothyroidism: Secondary | ICD-10-CM

## 2022-11-11 DIAGNOSIS — E039 Hypothyroidism, unspecified: Secondary | ICD-10-CM

## 2022-11-11 MED ORDER — LEVOTHYROXINE SODIUM 175 MCG PO TABS
175.0000 ug | ORAL_TABLET | Freq: Every day | ORAL | 1 refills | Status: DC
Start: 2022-11-11 — End: 2023-02-10

## 2022-11-11 NOTE — Progress Notes (Signed)
11/11/2022     Endocrinology Follow Up Note    Subjective:    Patient ID: Paula Myers, female    DOB: 07/21/1985, PCP Gilmore Laroche, FNP.   Past Medical History:  Diagnosis Date   Anemia    prior pregnancies   Anxiety    Depression    Dysrhythmia    "skipping" HR at beginning of pregnancy   Encounter for annual general medical examination with abnormal findings in adult 12/18/2021   Encounter for general adult medical examination with abnormal findings 10/07/2020   H/O cervical polypectomy    Heartburn during pregnancy    History of cesarean section 08/22/2014   Hypertension 2005   while in labor   Hyperthyroidism    Iron deficiency anemia due to chronic blood loss 11/21/2020   Kidney stone    Need for immunization against influenza 12/18/2021   Palpitation    Screening due 10/07/2020   SVT (supraventricular tachycardia)    SVT (supraventricular tachycardia)    Tachycardia    Wound, open, nose 10/07/2020    Past Surgical History:  Procedure Laterality Date   ABDOMINAL HYSTERECTOMY N/A 05/13/2021   Procedure: HYSTERECTOMY ABDOMINAL;  Surgeon: Myna Hidalgo, DO;  Location: [facility];  Service: Gynecology;  Laterality: N/A;   CESAREAN SECTION     06/11/09; 05/25/2003   CESAREAN SECTION WITH BILATERAL TUBAL LIGATION Bilateral 08/22/2014   Procedure: CESAREAN SECTION WITH BILATERAL TUBAL LIGATION;  Surgeon: Essie Hart, MD;  Location: [facility];  Service: Obstetrics;  Laterality: Bilateral;   DILATION AND CURETTAGE OF UTERUS  01/2008   ELBOW SURGERY     Left    Social History   Socioeconomic History   Marital status: Married    Spouse name: Not on file   Number of children: 3   Years of education: Not on file   Highest education level: Associate degree: occupational, Scientist, product/process development, or vocational program  Occupational History   Occupation: [employer]    Comment: Dispatcher  Tobacco Use   Smoking status: Every Day    Current packs/day:  0.00    Average packs/day: 0.3 packs/day for 20.0 years (5.0 ttl pk-yrs)    Types: Cigarettes, E-cigarettes    Start date: 04/10/2002    Last attempt to quit: 04/10/2022    Years since quitting: 0.5   Smokeless tobacco: Never   Tobacco comments:    Quit cigarettes in February 2024. Vape cartridge currently lasts 1 month at a time  Vaping Use   Vaping status: Former  Substance and Sexual Activity   Alcohol use: Not Currently    Comment: maybe 1-2 per year   Drug use: Never   Sexual activity: Yes    Birth control/protection: Surgical    Comment: hyst  Other Topics Concern   Not on file  Social History Narrative   Not on file   Social Determinants of Health   Financial Resource Strain: Low Risk  (08/05/2022)   Overall Financial Resource Strain (CARDIA)    Difficulty of Paying Living Expenses: Not hard at all  Food Insecurity: No Food Insecurity (08/05/2022)   Hunger Vital Sign    Worried About Running Out of Food in the Last Year: Never true    Ran Out of Food in the Last Year: Never true  Transportation Needs: No Transportation Needs (08/05/2022)   PRAPARE - Administrator, Civil Service (Medical): No    Lack of Transportation (Non-Medical): No  Physical Activity: Sufficiently Active (08/05/2022)  Exercise Vital Sign    Days of Exercise per Week: 3 days    Minutes of Exercise per Session: 120 min  Stress: Stress Concern Present (08/05/2022)   [employer] of Occupational Health - Occupational Stress Questionnaire    Feeling of Stress : Very much  Social Connections: Moderately Isolated (08/05/2022)   Social Connection and Isolation Panel [NHANES]    Frequency of Communication with Friends and Family: Three times a week    Frequency of Social Gatherings with Friends and Family: Never    Attends Religious Services: Never    Database administrator or Organizations: No    Attends Engineer, structural: Not on file    Marital Status: Married    Family History   Problem Relation Age of Onset   Cancer Paternal Grandfather        thyroid   Cancer Paternal Grandmother        ovarian   Diabetes Maternal Grandmother    Hypertension Maternal Grandmother    Heart disease Maternal Grandmother    Hypertension Mother    Diabetes Mother    Diabetes Brother     Outpatient Encounter Medications as of 11/11/2022  Medication Sig   acetaminophen (TYLENOL) 325 MG tablet Take 2 tablets (650 mg total) by mouth every 6 (six) hours as needed for moderate pain or mild pain.   amLODipine (NORVASC) 5 MG tablet TAKE 1 TABLET(5 MG) BY MOUTH DAILY   benzoyl peroxide-erythromycin (BENZAMYCIN) gel Apply topically 2 (two) times daily.   cyclobenzaprine (FLEXERIL) 5 MG tablet Take 1 tablet (5 mg total) by mouth 3 (three) times daily as needed for muscle spasms.   hydrochlorothiazide (HYDRODIURIL) 12.5 MG tablet Take 1 tablet (12.5 mg total) by mouth daily.   ibuprofen (ADVIL) 600 MG tablet Take 1 tablet (600 mg total) by mouth every 6 (six) hours as needed.   lamoTRIgine (LAMICTAL) 100 MG tablet Take 1 tablet (100 mg total) by mouth daily. After completing 75 mg dose.   melatonin 5 MG TABS Take 5 mg by mouth at bedtime.   naproxen (NAPROSYN) 500 MG tablet TAKE 1 TABLET(500 MG) BY MOUTH DAILY   oxyCODONE-acetaminophen (PERCOCET/ROXICET) 5-325 MG tablet Take 1 tablet by mouth every 4 (four) hours as needed for up to 5 days for severe pain.   propranolol (INDERAL) 10 MG tablet Take 1 tablet (10 mg total) by mouth 2 (two) times daily as needed (panic).   Semaglutide-Weight Management (WEGOVY) 1.7 MG/0.75ML SOAJ Inject 1.7 mg into the skin once a week.   tamsulosin (FLOMAX) 0.4 MG CAPS capsule Take 1 capsule (0.4 mg total) by mouth daily after supper.   tretinoin (RETIN-A) 0.025 % cream Apply topically at bedtime.   [DISCONTINUED] levothyroxine (SYNTHROID) 150 MCG tablet Take 1 tablet (150 mcg total) by mouth daily.   levothyroxine (SYNTHROID) 175 MCG tablet Take 1 tablet (175  mcg total) by mouth daily before breakfast.   No facility-administered encounter medications on file as of 11/11/2022.    ALLERGIES: Allergies  Allergen Reactions   Levaquin [Levofloxacin In D5w] Anaphylaxis   Levofloxacin Anaphylaxis   Pertussis Vaccines Swelling    T-Dap injection caused localized swelling of arm. Has had Tetanus before without problems.   Megace [Megestrol] Other (See Comments)    Cause Depression   Tetanus-Diphth-Acell Pertussis Swelling   Venofer [Iron Sucrose] Other (See Comments)    Chest Pain    VACCINATION STATUS: Immunization History  Administered Date(s) Administered   DTaP 09/09/1983, 11/18/1983, 02/03/1984, 01/25/1985, 10/29/1988  Hepatitis B 12/09/1994, 01/14/1995, 06/16/1995   IPV 09/09/1983, 11/18/1983, 02/03/1984, 01/25/1985   Influenza,inj,Quad PF,6+ Mos 11/07/2020, 12/18/2021   Influenza-Unspecified 12/18/2002, 01/23/2014   MMR 10/28/1984, 12/26/2004   Moderna Sars-Covid-2 Vaccination 12/19/2019, 01/16/2020   Tdap 12/26/2004     HPI  Shalyse Kearley Kudrna is 37 y.o. female who presents today with a medical history as above. she is being seen in follow up after being seen in consultation for hyperthyroidism requested by Gilmore Laroche, FNP.   she denies dysphagia, choking, shortness of breath, no recent voice change.    she does family history of thyroid dysfunction in her grandmother (Graves disease) and mother (MNG requiring thyroidectomy), but denies family hx of thyroid cancer. she denies personal history of goiter. she is not on any anti-thyroid medications nor on any thyroid hormone supplements. She does intermittently use of Biotin containing supplements but has not taken it in a few weeks.   She underwent RAI ablation for Graves disease on 11/15/20 and has since been started on thyroid hormone replacement.  She notes she did miss 2 days of her levothyroxine when she was out of town about a month ago because she forgot her pills at  home.   Review of systems  Constitutional: + Minimally fluctuating body weight (on [place]),  current Body mass index is 40.17 kg/m. , no fatigue, no subjective hyperthermia, no subjective hypothermia Eyes: no blurry vision, no xerophthalmia ENT: no sore throat, no nodules palpated in throat, no dysphagia/odynophagia, no hoarseness Cardiovascular: no chest pain, no shortness of breath, + intermittent chronic palpitations (improved since lowering Levothyroxine last visit), no leg swelling Respiratory: no cough, no shortness of breath Gastrointestinal: no nausea/vomiting/diarrhea Musculoskeletal: no muscle/joint aches Skin: no rashes, no hyperemia Neurological: no tremors, no numbness, no tingling, no dizziness Psychiatric: no depression, no anxiety   Objective:    BP 126/80 (BP Location: Left Arm, Patient Position: Sitting, Cuff Size: Large)   Pulse 89   Ht 5\' 5"  (1.651 m)   Wt 241 lb 6.4 oz (109.5 kg)   LMP 04/30/2021 Comment: Negative HCG on 05/09/2021  BMI 40.17 kg/m   Wt Readings from Last 3 Encounters:  11/11/22 241 lb 6.4 oz (109.5 kg)  11/06/22 243 lb 1.9 oz (110.3 kg)  09/11/22 245 lb 0.6 oz (111.1 kg)     BP Readings from Last 3 Encounters:  11/11/22 126/80  11/06/22 (!) 140/86  10/26/22 138/87                        Physical Exam- Limited  Constitutional:  Body mass index is 40.17 kg/m. , not in acute distress, normal state of mind Eyes:  EOMI, no exophthalmos Musculoskeletal: no gross deformities, strength intact in all four extremities, no gross restriction of joint movements Skin:  no rashes, no hyperemia Neurological: no tremor with outstretched hands   CMP     Component Value Date/Time   NA 141 11/06/2022 1639   K 4.5 11/06/2022 1639   CL 103 11/06/2022 1639   CO2 24 11/06/2022 1639   GLUCOSE 81 11/06/2022 1639   GLUCOSE 92 08/08/2022 2025   BUN 10 11/06/2022 1639   CREATININE 0.78 11/06/2022 1639   CALCIUM 9.6 11/06/2022 1639   PROT 7.5  11/06/2022 1639   ALBUMIN 4.5 11/06/2022 1639   AST 20 11/06/2022 1639   ALT 32 11/06/2022 1639   ALKPHOS 88 11/06/2022 1639   BILITOT 0.3 11/06/2022 1639   GFRNONAA >60 08/08/2022 2025   GFRAA >  60 09/22/2019 1938     CBC    Component Value Date/Time   WBC 12.2 (H) 11/06/2022 1639   WBC 12.4 (H) 08/08/2022 2025   RBC 5.30 (H) 11/06/2022 1639   RBC 5.45 (H) 08/08/2022 2025   HGB 15.4 11/06/2022 1639   HCT 46.4 11/06/2022 1639   PLT 335 11/06/2022 1639   MCV 88 11/06/2022 1639   MCH 29.1 11/06/2022 1639   MCH 29.4 08/08/2022 2025   MCHC 33.2 11/06/2022 1639   MCHC 35.0 08/08/2022 2025   RDW 12.2 11/06/2022 1639   LYMPHSABS 2.9 11/06/2022 1639   MONOABS 0.6 01/15/2022 1150   EOSABS 0.1 11/06/2022 1639   BASOSABS 0.1 11/06/2022 1639     Diabetic Labs (most recent): Lab Results  Component Value Date   HGBA1C 5.2 11/06/2022   HGBA1C 5.3 05/13/2022    Lipid Panel     Component Value Date/Time   CHOL 220 (H) 05/13/2022 1539   TRIG 99 05/13/2022 1539   HDL 48 05/13/2022 1539   CHOLHDL 4.6 (H) 05/13/2022 1539   LDLCALC 154 (H) 05/13/2022 1539   LABVLDL 18 05/13/2022 1539     Lab Results  Component Value Date   TSH 0.037 (L) 11/06/2022   TSH 0.025 (L) 08/05/2022   TSH 8.650 (H) 05/13/2022   TSH 6.940 (H) 03/19/2022   TSH 3.980 12/01/2021   TSH 4.890 (H) 08/05/2021   TSH 18.700 (H) 05/29/2021   TSH 19.900 (H) 03/31/2021   TSH 47.300 (H) 02/04/2021   TSH <0.005 (L) 12/31/2020   FREET4 1.69 11/06/2022   FREET4 2.40 (H) 08/05/2022   FREET4 1.37 05/13/2022   FREET4 1.37 03/19/2022   FREET4 1.63 12/01/2021   FREET4 1.30 08/05/2021   FREET4 0.88 05/29/2021   FREET4 0.95 03/31/2021   FREET4 0.13 (L) 02/04/2021   FREET4 1.28 12/31/2020     Uptake and Scan from 10/17/20 CLINICAL DATA:  Hyperthyroidism, TSH 0.450   EXAM: THYROID SCAN AND UPTAKE - 4 AND 24 HOURS   TECHNIQUE: Following oral administration of I-123 capsule, anterior planar imaging was  acquired at 24 hours. Thyroid uptake was calculated with a thyroid probe at 4-6 hours and 24 hours.   RADIOPHARMACEUTICALS:  436 uCi I-123 sodium iodide p.o.   COMPARISON:  None   FINDINGS: Homogeneous tracer distribution in both thyroid lobes.   No focal areas of increased or decreased tracer localization.   4 hour I-123 uptake = 14.1% (normal 5-20%)   24 hour I-123 uptake = 31.1% (normal 10-30%)   IMPRESSION: Normal thyroid scan.   Minimally elevated 24 hour radio iodine uptake.   In the setting of hyperthyroidism, findings suggest Graves disease     Electronically Signed   By: Ulyses Southward M.D.   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  -She underwent RAI ablation on 11/15/20.    Her previsit thyroid function tests are consistent with appropriate hormone replacement (TSH slightly suppressed but FT4 normal).  She is advised to continue her  Levothyroxine 175 mcg po daily before breakfast.  TSH is not the most reliable value to adjust thyroid hormone.  Her Reginal Lutes may also be impacting her thyroid hormone absorption.  We discussed not eating after 7 pm to allow enough time for the stomach to empty properly before taking her thyroid medication the next morning.  She also knows that weight fluctuations may also require adjustment to her thyroid dosage as well.  Will recheck TFTs prior to next visit in 3 months and adjust dosage accordingly.   - The correct intake of thyroid hormone (Levothyroxine, Synthroid), is on  empty stomach first thing in the morning, with water, separated by at least 30 minutes from breakfast and other medications,  and separated by more than 4 hours from calcium, iron, multivitamins, acid reflux medications (PPIs).  - This medication is a life-long medication and will be needed to correct thyroid hormone imbalances for the rest of your life.  The dose may change from time to time, based on thyroid blood work.  - It is extremely important to be consistent taking this medication, near the same time each morning.  -AVOID TAKING PRODUCTS CONTAINING BIOTIN (commonly found in Hair, Skin, Nails vitamins) AS IT INTERFERES WITH THE VALIDITY OF THYROID FUNCTION BLOOD TESTS.          -Patient is advised to maintain close follow up with Gilmore Laroche, FNP for primary care needs.    I spent  26  minutes in the care of the patient today including review of labs from Thyroid Function, CMP, and other relevant labs ; imaging/biopsy records (current and previous including abstractions from other facilities); face-to-face time discussing  her lab results and symptoms, medications doses, her options of short and long term treatment based on the latest standards of care / guidelines;   and documenting the encounter.  Collie Siad Ysaguirre  participated in the discussions, expressed understanding, and voiced agreement with the above plans.  All questions were answered to her satisfaction. she is encouraged to contact clinic should she have any questions or concerns prior to her return visit.    Follow up plan: Return in about 3 months (around 02/10/2023) for Thyroid follow up, Previsit labs.   Thank you for involving me in the care of this pleasant patient, and I will continue to update you with her progress.   Ronny Bacon, [facility] [address] [place], [place] [address] Phone: [phone] Fax: [phone]  11/11/2022, 8:46 AM

## 2022-11-13 ENCOUNTER — Encounter: Payer: Self-pay | Admitting: Family Medicine

## 2022-11-17 NOTE — Telephone Encounter (Signed)
Patient husband calling to follow up on MyChart message below needing to speak with a nurse asap. Please advise Thank you

## 2022-11-18 ENCOUNTER — Encounter (HOSPITAL_COMMUNITY): Payer: Self-pay

## 2022-11-18 ENCOUNTER — Emergency Department (HOSPITAL_COMMUNITY): Payer: Commercial Managed Care - PPO

## 2022-11-18 ENCOUNTER — Other Ambulatory Visit: Payer: Self-pay

## 2022-11-18 ENCOUNTER — Encounter (HOSPITAL_COMMUNITY): Payer: Self-pay | Admitting: Hematology

## 2022-11-18 ENCOUNTER — Emergency Department (HOSPITAL_COMMUNITY)
Admission: EM | Admit: 2022-11-18 | Discharge: 2022-11-18 | Disposition: A | Discharge status: Home / Self Care | Payer: Commercial Managed Care - PPO | Attending: Emergency Medicine | Admitting: Emergency Medicine

## 2022-11-18 DIAGNOSIS — Z79899 Other long term (current) drug therapy: Secondary | ICD-10-CM | POA: Insufficient documentation

## 2022-11-18 DIAGNOSIS — R109 Unspecified abdominal pain: Secondary | ICD-10-CM | POA: Diagnosis present

## 2022-11-18 DIAGNOSIS — N200 Calculus of kidney: Secondary | ICD-10-CM | POA: Diagnosis not present

## 2022-11-18 DIAGNOSIS — Z794 Long term (current) use of insulin: Secondary | ICD-10-CM | POA: Insufficient documentation

## 2022-11-18 DIAGNOSIS — I10 Essential (primary) hypertension: Secondary | ICD-10-CM | POA: Diagnosis not present

## 2022-11-18 LAB — URINALYSIS, ROUTINE W REFLEX MICROSCOPIC
Bilirubin Urine: NEGATIVE
Glucose, UA: NEGATIVE mg/dL
Ketones, ur: NEGATIVE mg/dL
Leukocytes,Ua: NEGATIVE
Nitrite: NEGATIVE
Protein, ur: NEGATIVE mg/dL
Specific Gravity, Urine: 1.003 — ABNORMAL LOW (ref 1.005–1.030)
pH: 6 (ref 5.0–8.0)

## 2022-11-18 LAB — COMPREHENSIVE METABOLIC PANEL WITH GFR
ALT: 31 U/L (ref 0–44)
AST: 23 U/L (ref 15–41)
Albumin: 4.4 g/dL (ref 3.5–5.0)
Alkaline Phosphatase: 68 U/L (ref 38–126)
Anion gap: 10 (ref 5–15)
BUN: 8 mg/dL (ref 6–20)
CO2: 23 mmol/L (ref 22–32)
Calcium: 9.3 mg/dL (ref 8.9–10.3)
Chloride: 104 mmol/L (ref 98–111)
Creatinine, Ser: 0.85 mg/dL (ref 0.44–1.00)
GFR, Estimated: >60 mL/min (ref >60)
Glucose, Bld: 94 mg/dL (ref 70–99)
Potassium: 4 mmol/L (ref 3.5–5.1)
Sodium: 137 mmol/L (ref 135–145)
Total Bilirubin: 0.2 mg/dL — ABNORMAL LOW (ref 0.3–1.2)
Total Protein: 8.2 g/dL — ABNORMAL HIGH (ref 6.5–8.1)

## 2022-11-18 LAB — CBC WITH DIFFERENTIAL/PLATELET
Abs Immature Granulocytes: 0.01 10*3/uL (ref 0.00–0.07)
Basophils Absolute: 0.1 10*3/uL (ref 0.0–0.1)
Basophils Relative: 1 %
Eosinophils Absolute: 0.1 10*3/uL (ref 0.0–0.5)
Eosinophils Relative: 1 %
HCT: 46 % (ref 36.0–46.0)
Hemoglobin: 15.1 g/dL — ABNORMAL HIGH (ref 12.0–15.0)
Immature Granulocytes: 0 %
Lymphocytes Relative: 23 %
Lymphs Abs: 2 10*3/uL (ref 0.7–4.0)
MCH: 28.5 pg (ref 26.0–34.0)
MCHC: 32.8 g/dL (ref 30.0–36.0)
MCV: 86.8 fL (ref 80.0–100.0)
Monocytes Absolute: 0.5 10*3/uL (ref 0.1–1.0)
Monocytes Relative: 5 %
Neutro Abs: 6.4 10*3/uL (ref 1.7–7.7)
Neutrophils Relative %: 70 %
Platelets: 333 10*3/uL (ref 150–400)
RBC: 5.3 MIL/uL — ABNORMAL HIGH (ref 3.87–5.11)
RDW: 12.7 % (ref 11.5–15.5)
WBC: 9 10*3/uL (ref 4.0–10.5)
nRBC: 0 % (ref 0.0–0.2)

## 2022-11-18 MED ORDER — ONDANSETRON HCL 4 MG/2ML IJ SOLN
4.0000 mg | Freq: Once | INTRAMUSCULAR | Status: AC
  Administered 2022-11-18: 4 mg via INTRAVENOUS
  Filled 2022-11-18: qty 2

## 2022-11-18 MED ORDER — MORPHINE SULFATE (PF) 4 MG/ML IV SOLN
4.0000 mg | Freq: Once | INTRAVENOUS | Status: AC
  Administered 2022-11-18: 4 mg via INTRAVENOUS
  Filled 2022-11-18: qty 1

## 2022-11-18 MED ORDER — ONDANSETRON 4 MG PO TBDP: 4.0000 mg | ORAL_TABLET | Freq: Three times a day (TID) | ORAL | 0 refills | Status: DC | PRN

## 2022-11-18 MED ORDER — OXYCODONE HCL 5 MG PO TABS: 5.0000 mg | ORAL_TABLET | ORAL | 0 refills | Status: DC | PRN

## 2022-11-18 MED ORDER — KETOROLAC TROMETHAMINE 30 MG/ML IJ SOLN
30.0000 mg | Freq: Once | INTRAMUSCULAR | Status: AC
  Administered 2022-11-18: 30 mg via INTRAVENOUS
  Filled 2022-11-18: qty 1

## 2022-11-18 MED ORDER — SODIUM CHLORIDE 0.9 % IV BOLUS
1000.0000 mL | Freq: Once | INTRAVENOUS | Status: AC
  Administered 2022-11-18: 1000 mL via INTRAVENOUS

## 2022-11-18 NOTE — ED Triage Notes (Signed)
Pt having rt flank pain since August 26,2024. Pt saw PCP and CT outpatient was ordered. Pt has been peeing blood with pain intermittently. Pt CT scan showed a cyst on rt kidney and kidney stones noted to both kidneys. Pt states yesterday pain becoming unbearable with n/v. Pt attempted to call PCP with no return so came into ED today for relief.

## 2022-11-18 NOTE — Discharge Instructions (Addendum)
Thank you for allowing [place] to be a part of your care today.  You were evaluated in the ED for ongoing right flank pain.  CT results: There is a 2.4 x 4.4 mm right lower ureteric calculus causing  mild proximal hydronephrosis and hydroureter.  There is no acute findings on the left and the lung nodule was also again seen today.   Please call [facility] to set up a follow up appointment.  I have sent pain medicine to the pharmacy for you to take for severe or breakthrough pain.  I have also sent in Zofran to take as needed for nausea and vomiting.   I recommend taking 600-800 mg of ibuprofen every 6-8 hours as needed for mild-moderate pain.    Return to the ED if you develop sudden worsening of your symptoms, have severe pain that is not improved despite medication, have uncontrollable nausea and vomiting, or develop fever.

## 2022-11-18 NOTE — ED Provider Notes (Signed)
[facility] EMERGENCY DEPARTMENT AT [facility] Provider Note   CSN: [id number] Arrival date & time: 11/18/22  4098     History  Chief Complaint  Patient presents with   Flank Pain    Paula Myers is a 37 y.o. female with past medical history significant for kidney stones, hypertension, thyroid disease presents to the ED complaining of worsening right flank pain with nausea and vomiting.  Patient has been having these symptoms since August.  She saw her PCP who ordered an outpatient CT scan.  CT demonstrated bilateral non-obstructing kidney stones and a right renal cyst.  Patient's pain became unbearable yesterday.  She contacted PCP, but had no return call, so she came to ED.  She has been having intermittent hematuria and dysuria.  Today she began having urinary urgency.  Denies fever, chills, abdominal pain, diarrhea.         Home Medications Prior to Admission medications   Medication Sig Start Date End Date Taking? Authorizing Provider  ondansetron (ZOFRAN-ODT) 4 MG disintegrating tablet Take 1 tablet (4 mg total) by mouth every 8 (eight) hours as needed for nausea or vomiting. 11/18/22  Yes Dayna Geurts R, PA-C  oxyCODONE (ROXICODONE) 5 MG immediate release tablet Take 1 tablet (5 mg total) by mouth every 4 (four) hours as needed for severe pain. 11/18/22  Yes Shankar Silber R, PA-C  acetaminophen (TYLENOL) 325 MG tablet Take 2 tablets (650 mg total) by mouth every 6 (six) hours as needed for moderate pain or mild pain. 05/13/21   Myna Hidalgo, DO  amLODipine (NORVASC) 5 MG tablet TAKE 1 TABLET(5 MG) BY MOUTH DAILY 10/05/22   Gilmore Laroche, FNP  benzoyl peroxide-erythromycin [facility]) gel Apply topically 2 (two) times daily. 09/09/22   Gilmore Laroche, FNP  cyclobenzaprine (FLEXERIL) 5 MG tablet Take 1 tablet (5 mg total) by mouth 3 (three) times daily as needed for muscle spasms. 09/11/22   Gilmore Laroche, FNP  hydrochlorothiazide (HYDRODIURIL) 12.5 MG tablet  Take 1 tablet (12.5 mg total) by mouth daily. 11/06/22   Gilmore Laroche, FNP  ibuprofen (ADVIL) 600 MG tablet Take 1 tablet (600 mg total) by mouth every 6 (six) hours as needed. 05/13/21   Myna Hidalgo, DO  lamoTRIgine (LAMICTAL) 100 MG tablet Take 1 tablet (100 mg total) by mouth daily. After completing 75 mg dose. 10/23/22   Elsie Lincoln, MD  levothyroxine (SYNTHROID) 175 MCG tablet Take 1 tablet (175 mcg total) by mouth daily before breakfast. 11/11/22   Dani Gobble, NP  melatonin 5 MG TABS Take 5 mg by mouth at bedtime.    [provider]  naproxen (NAPROSYN) 500 MG tablet TAKE 1 TABLET(500 MG) BY MOUTH DAILY 10/23/22   Gilmore Laroche, FNP  propranolol (INDERAL) 10 MG tablet Take 1 tablet (10 mg total) by mouth 2 (two) times daily as needed (panic). 09/16/22 12/15/22  Elsie Lincoln, MD  Semaglutide-Weight Management (WEGOVY) 1.7 MG/0.75ML SOAJ Inject 1.7 mg into the skin once a week. 11/04/22   Gilmore Laroche, FNP  tamsulosin (FLOMAX) 0.4 MG CAPS capsule Take 1 capsule (0.4 mg total) by mouth daily after supper. 10/26/22   Leath-Warren, Sadie Haber, NP  tretinoin (RETIN-A) 0.025 % cream Apply topically at bedtime. 09/09/22   Gilmore Laroche, FNP      Allergies    Levaquin [levofloxacin in d5w], Levofloxacin, Pertussis vaccines, Megace [megestrol], Tetanus-diphth-acell pertussis, and Venofer [iron sucrose]    Review of Systems   Review of Systems  Constitutional:  Negative  for chills and fever.  Gastrointestinal:  Positive for nausea and vomiting. Negative for abdominal pain and diarrhea.  Genitourinary:  Positive for dysuria, flank pain, hematuria and urgency.    Physical Exam Updated Vital Signs BP 128/85   Pulse 86   Temp 98.6 F (37 C) (Oral)   Resp 16   Ht 5\' 5"  (1.651 m)   Wt 109.5 kg   LMP 04/30/2021 Comment: Negative HCG on 05/09/2021  SpO2 98%   BMI 40.17 kg/m  Physical Exam Vitals and nursing note reviewed.  Constitutional:      General: She is  not in acute distress.    Appearance: Normal appearance. She is not ill-appearing or diaphoretic.  Cardiovascular:     Rate and Rhythm: Normal rate and regular rhythm.  Pulmonary:     Effort: Pulmonary effort is normal.  Abdominal:     General: Abdomen is flat.     Palpations: Abdomen is soft.     Tenderness: There is no abdominal tenderness. There is right CVA tenderness.  Skin:    General: Skin is warm and dry.     Capillary Refill: Capillary refill takes less than 2 seconds.  Neurological:     Mental Status: She is alert. Mental status is at baseline.  Psychiatric:        Mood and Affect: Mood normal.        Behavior: Behavior normal.     ED Results / Procedures / Treatments   Labs (all labs ordered are listed, but only abnormal results are displayed) Labs Reviewed  URINALYSIS, ROUTINE W REFLEX MICROSCOPIC - Abnormal; Notable for the following components:      Result Value   Specific Gravity, Urine 1.003 (*)    Hgb urine dipstick LARGE (*)    Bacteria, UA RARE (*)    All other components within normal limits  COMPREHENSIVE METABOLIC PANEL - Abnormal; Notable for the following components:   Total Protein 8.2 (*)    Total Bilirubin 0.2 (*)    All other components within normal limits  CBC WITH DIFFERENTIAL/PLATELET - Abnormal; Notable for the following components:   RBC 5.30 (*)    Hemoglobin 15.1 (*)    All other components within normal limits    EKG None  Radiology CT Renal Stone Study  Result Date: 11/18/2022 CLINICAL DATA:  Abdominal/flank pain, stone suspected hx of kidney stones, worsening pain, hematuria, dysuria EXAM: CT ABDOMEN AND PELVIS WITHOUT CONTRAST TECHNIQUE: Multidetector CT imaging of the abdomen and pelvis was performed following the standard protocol without IV contrast. RADIATION DOSE REDUCTION: This exam was performed according to the departmental dose-optimization program which includes automated exposure control, adjustment of the mA and/or kV  according to patient size and/or use of iterative reconstruction technique. COMPARISON:  CT scan abdomen and pelvis from 11/10/2022. FINDINGS: Lower chest: Redemonstration of sub 4 mm solid nodule in the inferior lingula, degraded by motion on this exam. The lung bases are otherwise clear. No pleural effusion. The heart is normal in size. No pericardial effusion. Hepatobiliary: The liver is normal in size. Non-cirrhotic configuration. No suspicious mass. These is mild diffuse hepatic steatosis. No intrahepatic or extrahepatic bile duct dilation. No calcified gallstones. Normal gallbladder wall thickness. No pericholecystic inflammatory changes. Pancreas: Unremarkable. No pancreatic ductal dilatation or surrounding inflammatory changes. Spleen: Within normal limits. No focal lesion. Adrenals/Urinary Tract: Adrenal glands are unremarkable. No suspicious renal mass within the limitations of this unenhanced exam. Redemonstration of a nearly completely exophytic 1.3 x 1.5 cm cyst  arising from the right kidney upper pole, anteromedially. There are multiple (more than 10), sub 5 mm calculi in the right kidney. There is a 2.4 x 4.4 mm right lower ureteric calculus causing mild proximal hydronephrosis and hydroureter. There are at least 5, sub 5 mm nonobstructing calculi in the left kidney. No left ureterolithiasis or obstructive uropathy. Urinary bladder is under distended, precluding optimal assessment. However, no large mass or stones identified. No perivesical fat stranding. Stomach/Bowel: No disproportionate dilation of the small or large bowel loops. No evidence of abnormal bowel wall thickening or inflammatory changes. The appendix is unremarkable. Vascular/Lymphatic: No ascites or pneumoperitoneum. No abdominal or pelvic lymphadenopathy, by size criteria. No aneurysmal dilation of the major abdominal arteries. Reproductive: The uterus is surgically absent. No large adnexal mass. Other: There is a tiny fat containing  umbilical hernia. The soft tissues and abdominal wall are otherwise unremarkable. Musculoskeletal: No suspicious osseous lesions. There are minimal multilevel degenerative changes in the visualized spine. IMPRESSION: 1. There is a 2.4 x 4.4 mm right lower ureteric calculus causing mild proximal hydronephrosis and hydroureter. 2. Bilateral nephrolithiasis. No left obstructive uropathy or ureterolithiasis. 3. Redemonstration of sub 4 mm noncalcified nodule in the inferior lingula. Please see follow-up recommendations below. 4. Multiple other nonacute observations, as described above. No follow-up needed if patient is low-risk (and has no known or suspected primary neoplasm). Non-contrast chest CT can be considered in 12 months if patient is high-risk. This recommendation follows the consensus statement: Guidelines for Management of Incidental Pulmonary Nodules Detected on CT Images: From the Fleischner Society 2017; Radiology 2017; 284:228-243. Electronically Signed   By: Jules Schick M.D.   On: 11/18/2022 11:11    Procedures Procedures    Medications Ordered in ED Medications  ondansetron (ZOFRAN) injection 4 mg (4 mg Intravenous Given 11/18/22 1059)  sodium chloride 0.9 % bolus 1,000 mL (0 mLs Intravenous Stopped 11/18/22 1312)  morphine (PF) 4 MG/ML injection 4 mg (4 mg Intravenous Given 11/18/22 1058)  ketorolac (TORADOL) 30 MG/ML injection 30 mg (30 mg Intravenous Given 11/18/22 1319)    ED Course/ Medical Decision Making/ A&P                                 Medical Decision Making Amount and/or Complexity of Data Reviewed Labs: ordered. Radiology: ordered.  Risk Prescription drug management.   This patient presents to the ED with chief complaint(s) of flank pain, urinary symptoms with pertinent past medical history of kidney stones.  The complaint involves an extensive differential diagnosis and also carries with it a high risk of complications and morbidity.    The differential  diagnosis includes obstructing kidney stone, pyelonephritis, UTI   The initial plan is to obtain labs, UA, CT renal  Additional history obtained: Records reviewed Primary Care Documents, patient had outpatient CT scan done due to flank pain.  Bilateral nonobstructing renal stones.  Initial Assessment:   On exam, patient appears uncomfortable but is not in acute distress.  Abdomen is soft and non tender to palpation.  R CVA tenderness.  No appreciable hernias or overlying skin changes.  Skin is warm and dry.  Heart rate is normal in the 80s with regular rhythm.    Independent ECG/labs interpretation:  The following labs were independently interpreted:  UA with large amount of Hgb, no leukocytes, rare bacteria.  Metabolic panel with normal renal function.  No electrolyte derangement.  CBC without leukocytosis  or anemia.    Independent visualization and interpretation of imaging: I independently visualized the following imaging with scope of interpretation limited to determining acute life threatening conditions related to emergency care: CT renal, which revealed 2.4 x 4.4 mm R lower ureteric calculus causing mild proximal hydronephrosis and hydroureter.  Bilateral non obstructing nephrolithiasis.    Treatment and Reassessment: Patient given IV fluids, morphine, Zofran, and Toradol with improvement in symptoms.  She was able to tolerate PO fluids.   Disposition:   Patient now has obstructing calculus on the R side, likely causing increase in symptoms.  Advised patient to follow up with [facility] outpatient.  Will send patient home with Zofran and oxycodone for nausea and pain control.    The patient has been appropriately medically screened and/or stabilized in the ED. I have low suspicion for any other emergent medical condition which would require further screening, evaluation or treatment in the ED or require inpatient management. At time of discharge the patient is hemodynamically  stable and in no acute distress. I have discussed work-up results and diagnosis with patient and answered all questions. Patient is agreeable with discharge plan. We discussed strict return precautions for returning to the emergency department and they verbalized understanding.             Final Clinical Impression(s) / ED Diagnoses Final diagnoses:  Acute right flank pain  Kidney stone    Rx / DC Orders ED Discharge Orders          Ordered    ondansetron (ZOFRAN-ODT) 4 MG disintegrating tablet  Every 8 hours PRN        11/18/22 1310    oxyCODONE (ROXICODONE) 5 MG immediate release tablet  Every 4 hours PRN        11/18/22 [address], [place], PA-C 11/19/22 1250    Bethann Berkshire, MD 11/21/22 1257

## 2022-12-01 ENCOUNTER — Other Ambulatory Visit: Payer: Self-pay | Admitting: Family Medicine

## 2022-12-01 MED ORDER — WEGOVY 2.4 MG/0.75ML ~~LOC~~ SOAJ: 2.4000 mg | SUBCUTANEOUS | 0 refills | Status: DC

## 2022-12-03 ENCOUNTER — Other Ambulatory Visit: Payer: Self-pay | Admitting: Family Medicine

## 2022-12-03 DIAGNOSIS — I10 Essential (primary) hypertension: Secondary | ICD-10-CM

## 2022-12-14 ENCOUNTER — Ambulatory Visit: Payer: Commercial Managed Care - PPO | Admitting: Family Medicine

## 2022-12-30 ENCOUNTER — Ambulatory Visit
Admission: EM | Admit: 2022-12-30 | Discharge: 2022-12-30 | Disposition: A | Discharge status: Home / Self Care | Payer: Commercial Managed Care - PPO | Attending: Nurse Practitioner | Admitting: Nurse Practitioner

## 2022-12-30 ENCOUNTER — Encounter (HOSPITAL_COMMUNITY): Payer: Self-pay | Admitting: Hematology

## 2022-12-30 ENCOUNTER — Ambulatory Visit: Payer: Commercial Managed Care - PPO

## 2022-12-30 DIAGNOSIS — Z8739 Personal history of other diseases of the musculoskeletal system and connective tissue: Secondary | ICD-10-CM | POA: Diagnosis not present

## 2022-12-30 DIAGNOSIS — M545 Low back pain, unspecified: Secondary | ICD-10-CM

## 2022-12-30 MED ORDER — DEXAMETHASONE SODIUM PHOSPHATE 10 MG/ML IJ SOLN
10.0000 mg | INTRAMUSCULAR | Status: AC
  Administered 2022-12-30: 10 mg via INTRAMUSCULAR

## 2022-12-30 MED ORDER — IBUPROFEN 800 MG PO TABS: 800.0000 mg | ORAL_TABLET | Freq: Three times a day (TID) | ORAL | 0 refills | Status: AC

## 2022-12-30 MED ORDER — KETOROLAC TROMETHAMINE 30 MG/ML IJ SOLN
30.0000 mg | Freq: Once | INTRAMUSCULAR | Status: AC
  Administered 2022-12-30: 30 mg via INTRAMUSCULAR

## 2022-12-30 MED ORDER — METHOCARBAMOL 500 MG PO TABS: 500.0000 mg | ORAL_TABLET | Freq: Two times a day (BID) | ORAL | 0 refills | Status: DC

## 2022-12-30 NOTE — ED Triage Notes (Signed)
Pt c/o back pain , center of the lower back down into the hip with difficulty moving left leg. Pt reports that it was hurting a few weeks ago, and tried tylenol ibuprofen and muscle relaxer's. Rest a few days and it subsided and pain is back again.x 3 days
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
## 2022-12-30 NOTE — Discharge Instructions (Addendum)
You have been given injections of Decadron 10 mg and Toradol 30 mg.  Do not take any additional ibuprofen today.  You may take over-the-counter Tylenol arthritis strength 650 mg tablets as needed.  You can begin taking ibuprofen on 12/31/2022. Take medication as prescribed. Try to remain as active as possible. Gentle range of motion and stretching exercises to help with back spasm and pain. May apply ice or heat as needed.  Ice is recommended for pain or swelling, heat for spasm or stiffness.  Apply for 20 minutes, remove for 1 hour, then repeat. May take over-the-counter Tylenol extra strength 500 mg tablet approximately 1 -2 hours after taking ibuprofen for breakthrough pain. Go to the emergency department immediately if you develop weakness in your legs or feet, inability to walk, loss of bowel or bladder function, difficulty urinating or passing a bowel movement, or other concerns. If symptoms fail to improve with this treatment, it is recommended that you follow-up with orthopedics for further evaluation.  You can follow-up with [facility] or with [facility]. Follow-up as needed.

## 2023-01-21 ENCOUNTER — Other Ambulatory Visit (HOSPITAL_COMMUNITY): Payer: Self-pay | Admitting: Psychiatry

## 2023-01-21 ENCOUNTER — Encounter (HOSPITAL_COMMUNITY): Payer: Self-pay

## 2023-01-21 DIAGNOSIS — F331 Major depressive disorder, recurrent, moderate: Secondary | ICD-10-CM

## 2023-01-21 MED ORDER — LAMOTRIGINE 100 MG PO TABS
100.0000 mg | ORAL_TABLET | Freq: Every day | ORAL | 2 refills | Status: DC
Start: 2023-01-21 — End: 2023-02-04

## 2023-01-21 NOTE — Progress Notes (Signed)
Patient with refill request for lamictal, has follow up in December. Refill sent.

## 2023-01-25 ENCOUNTER — Other Ambulatory Visit: Payer: Self-pay | Admitting: Family Medicine

## 2023-01-25 DIAGNOSIS — I10 Essential (primary) hypertension: Secondary | ICD-10-CM

## 2023-02-04 ENCOUNTER — Encounter (HOSPITAL_COMMUNITY): Payer: Self-pay | Admitting: Psychiatry

## 2023-02-04 ENCOUNTER — Telehealth (HOSPITAL_COMMUNITY): Payer: Commercial Managed Care - PPO | Admitting: Psychiatry

## 2023-02-04 DIAGNOSIS — F410 Panic disorder [episodic paroxysmal anxiety] without agoraphobia: Secondary | ICD-10-CM

## 2023-02-04 DIAGNOSIS — F411 Generalized anxiety disorder: Secondary | ICD-10-CM

## 2023-02-04 DIAGNOSIS — F331 Major depressive disorder, recurrent, moderate: Secondary | ICD-10-CM | POA: Diagnosis not present

## 2023-02-04 DIAGNOSIS — Z6981 Encounter for mental health services for victim of other abuse: Secondary | ICD-10-CM | POA: Diagnosis not present

## 2023-02-04 DIAGNOSIS — F17210 Nicotine dependence, cigarettes, uncomplicated: Secondary | ICD-10-CM

## 2023-02-04 DIAGNOSIS — F4310 Post-traumatic stress disorder, unspecified: Secondary | ICD-10-CM | POA: Diagnosis not present

## 2023-02-04 DIAGNOSIS — Z720 Tobacco use: Secondary | ICD-10-CM

## 2023-02-04 MED ORDER — LAMOTRIGINE 100 MG PO TABS
100.0000 mg | ORAL_TABLET | Freq: Every day | ORAL | 3 refills | Status: DC
Start: 2023-02-04 — End: 2023-06-01

## 2023-02-04 NOTE — Patient Instructions (Signed)
We did not make any medication changes today.  Keep up the good work with keeping your environment safe.
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
## 2023-02-08 ENCOUNTER — Ambulatory Visit (INDEPENDENT_AMBULATORY_CARE_PROVIDER_SITE_OTHER): Payer: Commercial Managed Care - PPO | Admitting: Family Medicine

## 2023-02-08 VITALS — BP 124/72 | HR 84 | Ht 65.0 in | Wt 246.0 lb

## 2023-02-08 DIAGNOSIS — I10 Essential (primary) hypertension: Secondary | ICD-10-CM

## 2023-02-08 DIAGNOSIS — E038 Other specified hypothyroidism: Secondary | ICD-10-CM | POA: Diagnosis not present

## 2023-02-08 DIAGNOSIS — E7849 Other hyperlipidemia: Secondary | ICD-10-CM

## 2023-02-08 DIAGNOSIS — R7301 Impaired fasting glucose: Secondary | ICD-10-CM | POA: Diagnosis not present

## 2023-02-08 DIAGNOSIS — E559 Vitamin D deficiency, unspecified: Secondary | ICD-10-CM

## 2023-02-08 MED ORDER — PHENTERMINE HCL 15 MG PO CAPS
15.0000 mg | ORAL_CAPSULE | ORAL | 0 refills | Status: DC
Start: 2023-02-08 — End: 2023-05-05

## 2023-02-08 NOTE — Progress Notes (Signed)
Established Patient Office Visit  Subjective:  Patient ID: Paula Myers, female    DOB: 1985/04/19  Age: 37 y.o. MRN: [id number]  CC:  Chief Complaint  Patient presents with   Care Management    3 month f/u, would like to discuss ct results from August.   Obesity    Would like to discuss med change, wegovy did not work for her was having too many side effects, wants to try to go back to phentermine.     HPI Paula Myers is a 37 y.o. female with past medical history of hypertension, hypothyroidism, obesity presents for f/u of  chronic medical conditions. For the details of today's visit, please refer to the assessment and plan.     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   Depression    Dysrhythmia    "skipping" HR at beginning of pregnancy   Encounter for annual general medical examination with abnormal findings in adult 12/18/2021   Encounter for general adult medical examination with abnormal findings 10/07/2020   H/O cervical polypectomy    Heartburn during pregnancy    History of cesarean section 08/22/2014   Hypertension 2005   while in labor   Hyperthyroidism    Iron deficiency anemia due to chronic blood loss 11/21/2020   Kidney stone    Need for immunization against influenza 12/18/2021   Palpitation    Screening due 10/07/2020   SVT (supraventricular tachycardia) (HCC)    SVT (supraventricular tachycardia) (HCC)    Tachycardia    Wound, open, nose 10/07/2020    Past Surgical History:  Procedure Laterality Date   ABDOMINAL HYSTERECTOMY N/A 05/13/2021   Procedure: HYSTERECTOMY ABDOMINAL;  Surgeon: Myna Hidalgo, DO;  Location: [facility];  Service: Gynecology;  Laterality: N/A;   CESAREAN SECTION     06/11/09; 05/25/2003   CESAREAN SECTION WITH BILATERAL TUBAL LIGATION Bilateral 08/22/2014   Procedure: CESAREAN SECTION WITH BILATERAL TUBAL LIGATION;  Surgeon: Essie Hart, MD;   Location: [facility];  Service: Obstetrics;  Laterality: Bilateral;   DILATION AND CURETTAGE OF UTERUS  01/2008   ELBOW SURGERY     Left    Family History  Problem Relation Age of Onset   Cancer Paternal Grandfather        thyroid   Cancer Paternal Grandmother        ovarian   Diabetes Maternal Grandmother    Hypertension Maternal Grandmother    Heart disease Maternal Grandmother    Hypertension Mother    Diabetes Mother    Diabetes Brother     Social History   Socioeconomic History   Marital status: Married    Spouse name: Not on file   Number of children: 3   Years of education: Not on file   Highest education level: Associate degree: occupational, Scientist, product/process development, or vocational program  Occupational History   Occupation: [employer]    Comment: Dispatcher  Tobacco Use   Smoking status: Every Day    Types: E-cigarettes   Smokeless tobacco: Never   Tobacco comments:    Quit cigarettes in February 2024. Vape cartridge currently lasts 1 month at a time  Vaping Use   Vaping status: Former  Substance and Sexual Activity   Alcohol use: Not Currently    Comment: maybe 1-2 per year   Drug use: Never   Sexual activity: Yes    Birth control/protection: Surgical  Comment: hyst  Other Topics Concern   Not on file  Social History Narrative   Not on file   Social Determinants of Health   Financial Resource Strain: Low Risk  (02/08/2023)   Overall Financial Resource Strain (CARDIA)    Difficulty of Paying Living Expenses: Not very hard  Food Insecurity: No Food Insecurity (02/08/2023)   Hunger Vital Sign    Worried About Running Out of Food in the Last Year: Never true    Ran Out of Food in the Last Year: Never true  Transportation Needs: No Transportation Needs (02/08/2023)   PRAPARE - Administrator, Civil Service (Medical): No    Lack of Transportation (Non-Medical): No  Physical Activity: Insufficiently Active (02/08/2023)   Exercise Vital Sign     Days of Exercise per Week: 3 days    Minutes of Exercise per Session: 30 min  Stress: Stress Concern Present (02/08/2023)   [employer] of Occupational Health - Occupational Stress Questionnaire    Feeling of Stress : To some extent  Social Connections: Moderately Integrated (02/08/2023)   Social Connection and Isolation Panel [NHANES]    Frequency of Communication with Friends and Family: Three times a week    Frequency of Social Gatherings with Friends and Family: Once a week    Attends Religious Services: 1 to 4 times per year    Active Member of [employer] or Organizations: No    Attends Engineer, structural: Not on file    Marital Status: Married  Catering manager Violence: Not At Risk (03/14/2021)   Humiliation, Afraid, Rape, and Kick questionnaire    Fear of Current or Ex-Partner: No    Emotionally Abused: No    Physically Abused: No    Sexually Abused: No    Outpatient Medications Prior to Visit  Medication Sig Dispense Refill   acetaminophen (TYLENOL) 325 MG tablet Take 2 tablets (650 mg total) by mouth every 6 (six) hours as needed for moderate pain or mild pain.     amLODipine (NORVASC) 5 MG tablet TAKE 1 TABLET(5 MG) BY MOUTH DAILY 30 tablet 1   benzoyl peroxide-erythromycin (BENZAMYCIN) gel Apply topically 2 (two) times daily. 23.3 g 0   hydrochlorothiazide (HYDRODIURIL) 12.5 MG tablet TAKE 1 TABLET(12.5 MG) BY MOUTH DAILY 30 tablet 1   ibuprofen (ADVIL) 800 MG tablet Take 1 tablet (800 mg total) by mouth 3 (three) times daily. 21 tablet 0   lamoTRIgine (LAMICTAL) 100 MG tablet Take 1 tablet (100 mg total) by mouth daily. 30 tablet 3   levothyroxine (SYNTHROID) 175 MCG tablet Take 1 tablet (175 mcg total) by mouth daily before breakfast. 90 tablet 1   naproxen (NAPROSYN) 500 MG tablet TAKE 1 TABLET(500 MG) BY MOUTH DAILY 20 tablet 0   tretinoin (RETIN-A) 0.025 % cream Apply topically at bedtime. 45 g 0   No facility-administered medications prior to visit.     Allergies  Allergen Reactions   Levaquin [Levofloxacin In D5w] Anaphylaxis   Levofloxacin Anaphylaxis   Pertussis Vaccines Swelling    T-Dap injection caused localized swelling of arm. Has had Tetanus before without problems.   Megace [Megestrol] Other (See Comments)    Cause Depression   Tetanus-Diphth-Acell Pertussis Swelling   Venofer [Iron Sucrose] Other (See Comments)    Chest Pain    ROS Review of Systems  Constitutional:  Negative for chills and fever.  Eyes:  Negative for visual disturbance.  Respiratory:  Negative for chest tightness and shortness of breath.  Neurological:  Negative for dizziness and headaches.      Objective:    Physical Exam HENT:     Head: Normocephalic.     Mouth/Throat:     Mouth: Mucous membranes are moist.  Cardiovascular:     Rate and Rhythm: Normal rate.     Heart sounds: Normal heart sounds.  Pulmonary:     Effort: Pulmonary effort is normal.     Breath sounds: Normal breath sounds.  Neurological:     Mental Status: She is alert.     BP 124/72   Pulse 84   Ht 5\' 5"  (1.651 m)   Wt 246 lb (111.6 kg)   LMP 04/30/2021 Comment: Negative HCG on 05/09/2021  SpO2 92%   BMI 40.94 kg/m  Wt Readings from Last 3 Encounters:  02/08/23 246 lb (111.6 kg)  11/18/22 241 lb 6.1 oz (109.5 kg)  11/11/22 241 lb 6.4 oz (109.5 kg)    Lab Results  Component Value Date   TSH 0.037 (L) 11/06/2022   Lab Results  Component Value Date   WBC 9.0 11/18/2022   HGB 15.1 (H) 11/18/2022   HCT 46.0 11/18/2022   MCV 86.8 11/18/2022   PLT 333 11/18/2022   Lab Results  Component Value Date   NA 137 11/18/2022   K 4.0 11/18/2022   CO2 23 11/18/2022   GLUCOSE 94 11/18/2022   BUN 8 11/18/2022   CREATININE 0.85 11/18/2022   BILITOT 0.2 (L) 11/18/2022   ALKPHOS 68 11/18/2022   AST 23 11/18/2022   ALT 31 11/18/2022   PROT 8.2 (H) 11/18/2022   ALBUMIN 4.4 11/18/2022   CALCIUM 9.3 11/18/2022   ANIONGAP 10 11/18/2022   EGFR 99 11/06/2022    GFR 102.38 01/15/2014   Lab Results  Component Value Date   CHOL 220 (H) 05/13/2022   Lab Results  Component Value Date   HDL 48 05/13/2022   Lab Results  Component Value Date   LDLCALC 154 (H) 05/13/2022   Lab Results  Component Value Date   TRIG 99 05/13/2022   Lab Results  Component Value Date   CHOLHDL 4.6 (H) 05/13/2022   Lab Results  Component Value Date   HGBA1C 5.2 11/06/2022      Assessment & Plan:  Primary hypertension Assessment & Plan: Controlled BP in the clinic today She reports compliance on amlodipine 5 mg and hydrochlorothiazide 12.5 mg Asymptomatic in the clinic Low-sodium diet with increased physical activity encouraged BP Readings from Last 3 Encounters:  02/08/23 124/72  12/30/22 (!) 143/90  11/18/22 128/85        Morbid obesity (HCC) Assessment & Plan:  The patient reports not tolerating the side effects of Wegovy, including severe nausea and indigestion. We have decided to switch her to oral phentermine starting today. She denies any cardiovascular history, and we reviewed the common side effects of the medication. The patient verbalized understanding of the plan. We will initiate therapy with phentermine 15 mg daily and encourage monthly follow-up visits to assess treatment effectiveness and monitor for side effects. The patient was also advised on the importance of adhering to a heart-healthy diet and increasing physical activity for optimal results while on therapy, and she verbalized understanding Wt Readings from Last 3 Encounters:  02/08/23 246 lb (111.6 kg)  11/18/22 241 lb 6.1 oz (109.5 kg)  11/11/22 241 lb 6.4 oz (109.5 kg)      Orders: -     Phentermine HCl; Take 1 capsule (15 mg total) by  mouth every morning.  Dispense: 30 capsule; Refill: 0  Other specified hypothyroidism Assessment & Plan: The patient takes Synthroid 175 mcg daily on an empty stomach and reports compliance with the treatment regimen, with no adverse  effects noted.  Encouraged to continue current treatment regimen Lab Results  Component Value Date   TSH 0.037 (L) 11/06/2022      IFG (impaired fasting glucose) -     Hemoglobin A1c  Vitamin D deficiency -     VITAMIN D 25 Hydroxy (Vit-D Deficiency, Fractures)  TSH (thyroid-stimulating hormone deficiency) -     TSH + free T4  Other hyperlipidemia -     Lipid panel -     CMP14+EGFR -     CBC with Differential/Platelet  Note: This chart has been completed using Engineer, civil (consulting) software, and while attempts have been made to ensure accuracy, certain words and phrases may not be transcribed as intended.    Follow-up: Return in about 1 month (around 03/11/2023) for obesity.   Gilmore Laroche, FNP

## 2023-02-08 NOTE — Patient Instructions (Addendum)
I appreciate the opportunity to provide care to you today!    Follow up:  1 month   Fasting Labs: please stop by the lab during the week to get your blood drawn (CBC, CMP, TSH, Lipid profile, HgA1c, Vit D)  -Start taking Phentermine 15 mg daily -Continue following a heart-healthy diet and increase physical activity while adhering to the treatment regimen for optimal results Please seek urgent medical care if experiencing symptoms such as coughing, chest pain, shortness of breath, or unexplained weight loss related to the pulmonary nodule measuring 3 mm.    Please continue to a heart-healthy diet and increase your physical activities. Try to exercise for at least five days a week.    It was a pleasure to see you and I look forward to continuing to work together on your health and well-being. Please do not hesitate to call the office if you need care or have questions about your care.  In case of emergency, please visit the Emergency Department for urgent care, or contact our clinic at [phone] to schedule an appointment. We're here to help you!   Have a wonderful day and week. With Gratitude, Gilmore Laroche MSN, FNP-BC

## 2023-02-08 NOTE — Assessment & Plan Note (Signed)
The patient takes Synthroid 175 mcg daily on an empty stomach and reports compliance with the treatment regimen, with no adverse effects noted.  Encouraged to continue current treatment regimen Lab Results  Component Value Date   TSH 0.037 (L) 11/06/2022

## 2023-02-08 NOTE — Assessment & Plan Note (Signed)
The patient reports not tolerating the side effects of Wegovy, including severe nausea and indigestion. We have decided to switch her to oral phentermine starting today. She denies any cardiovascular history, and we reviewed the common side effects of the medication. The patient verbalized understanding of the plan. We will initiate therapy with phentermine 15 mg daily and encourage monthly follow-up visits to assess treatment effectiveness and monitor for side effects. The patient was also advised on the importance of adhering to a heart-healthy diet and increasing physical activity for optimal results while on therapy, and she verbalized understanding Wt Readings from Last 3 Encounters:  02/08/23 246 lb (111.6 kg)  11/18/22 241 lb 6.1 oz (109.5 kg)  11/11/22 241 lb 6.4 oz (109.5 kg)

## 2023-02-08 NOTE — Assessment & Plan Note (Signed)
Controlled BP in the clinic today She reports compliance on amlodipine 5 mg and hydrochlorothiazide 12.5 mg Asymptomatic in the clinic Low-sodium diet with increased physical activity encouraged BP Readings from Last 3 Encounters:  02/08/23 124/72  12/30/22 (!) 143/90  11/18/22 128/85

## 2023-02-10 ENCOUNTER — Other Ambulatory Visit (HOSPITAL_COMMUNITY): Payer: Self-pay | Admitting: Psychiatry

## 2023-02-10 ENCOUNTER — Other Ambulatory Visit: Payer: Self-pay | Admitting: Nurse Practitioner

## 2023-02-10 DIAGNOSIS — F410 Panic disorder [episodic paroxysmal anxiety] without agoraphobia: Secondary | ICD-10-CM

## 2023-02-10 DIAGNOSIS — E039 Hypothyroidism, unspecified: Secondary | ICD-10-CM

## 2023-02-11 IMAGING — CT CT CERVICAL SPINE W/O CM
3 of 4 series · 13 of 33 positions shown, 16 images · non-contrast
Comparison: None.

CLINICAL DATA: Headache and neck pain.

EXAM:
CT HEAD WITHOUT CONTRAST
CT CERVICAL SPINE WITHOUT CONTRAST
TECHNIQUE: Multidetector CT imaging of the head and cervical spine was
performed following the standard protocol without intravenous
contrast. Multiplanar CT image reconstructions of the cervical spine
were also generated.

[Series 5: sagittal bone · sagittal · 0.26mm/px · 5 of 61 slices shown, 6 images]
[im 21/61  bone]
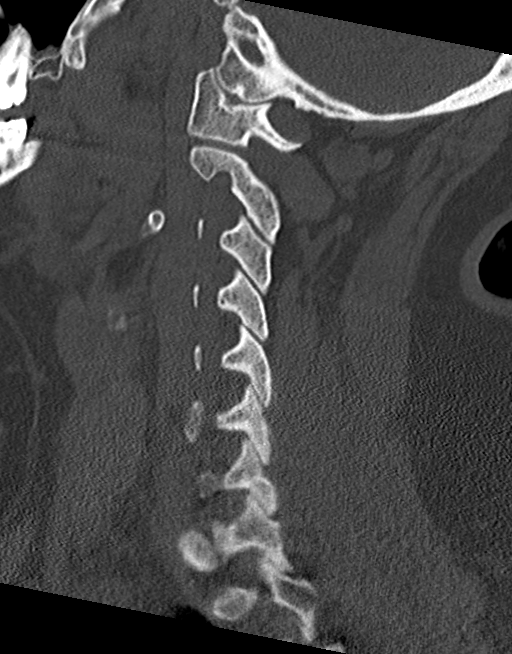
[im 26/61  bone]
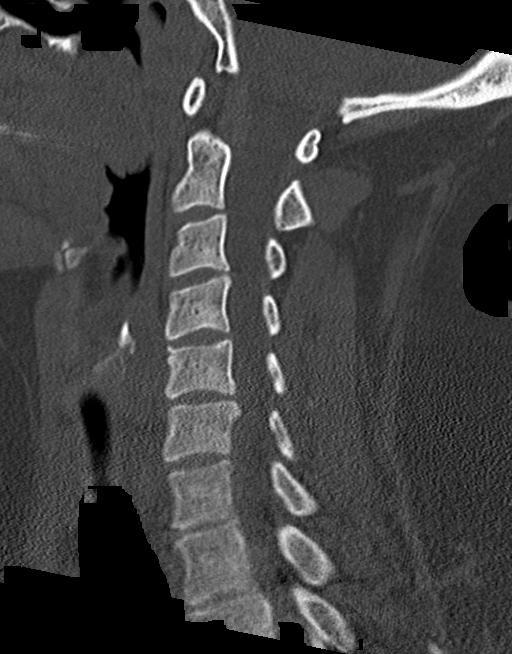
[im 31/61  soft-tissue]
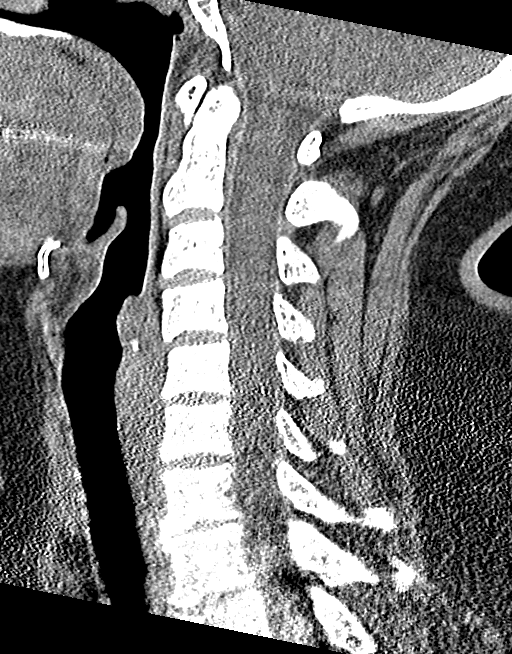
[im 31/61  bone]
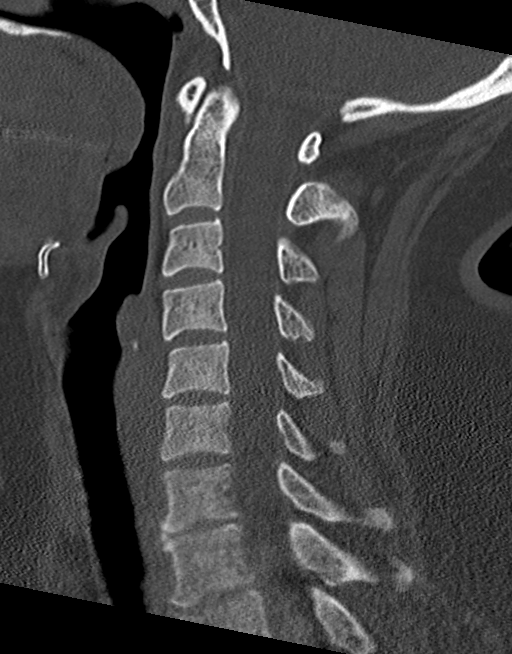
[im 36/61  bone]
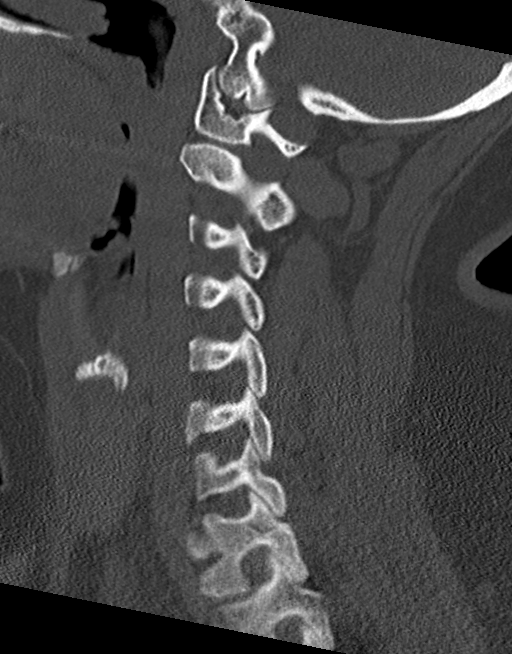
[im 41/61  bone]
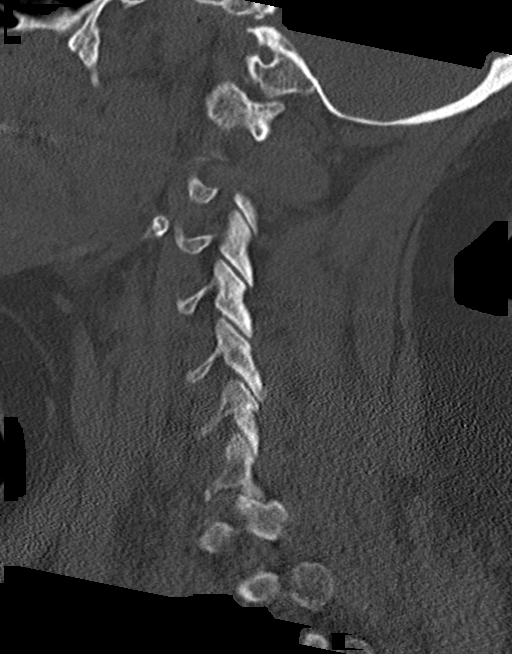

[Series 6: coronal bone · coronal · 0.25mm/px · 3 of 61 slices shown]
[im 13/61  bone]
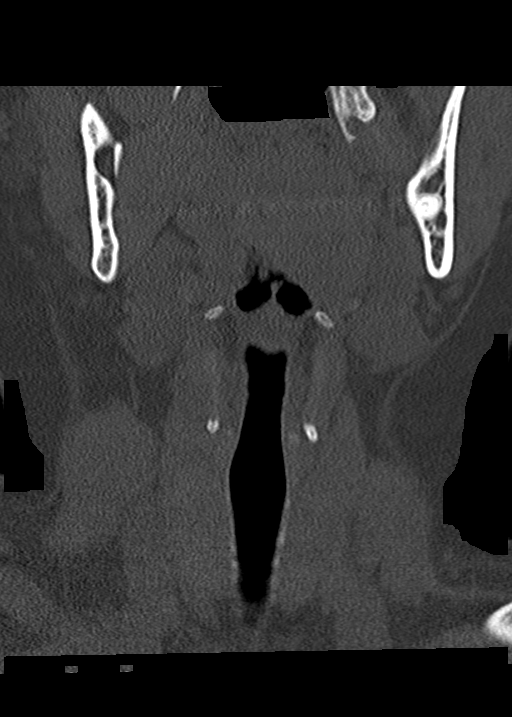
[im 25/61  bone]
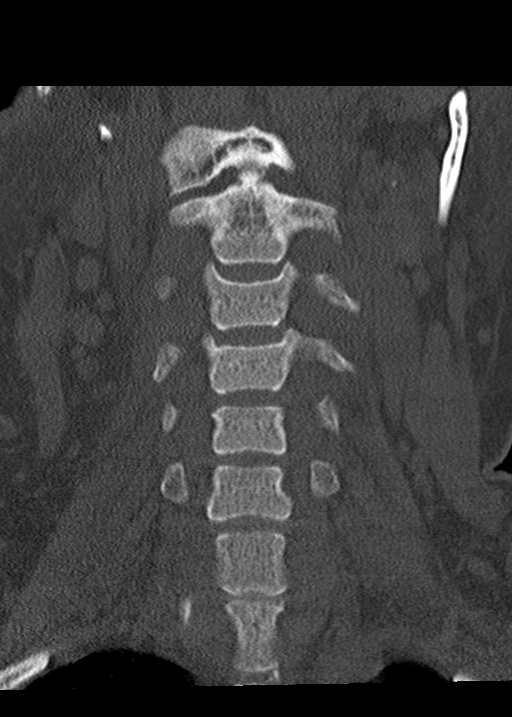
[im 37/61  bone]
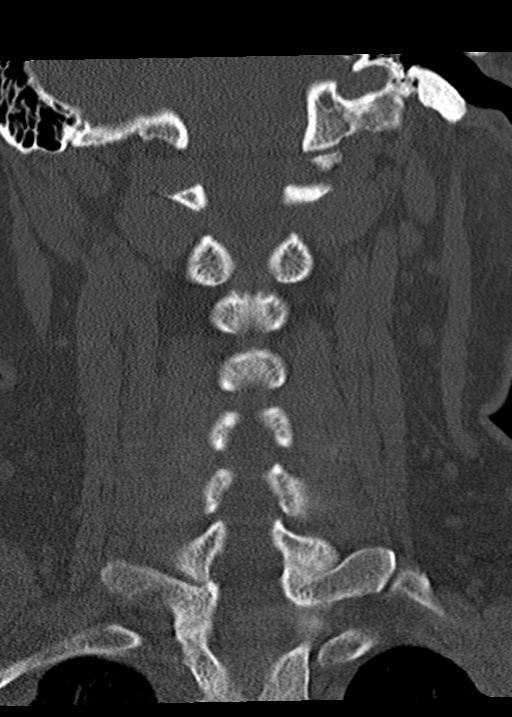

[Series 7: orthogonal axials · axial · 0.24mm/px · z∈[-227,-122]mm · 5 of 84 slices shown, 7 images]
[im 14/84  soft-tissue]
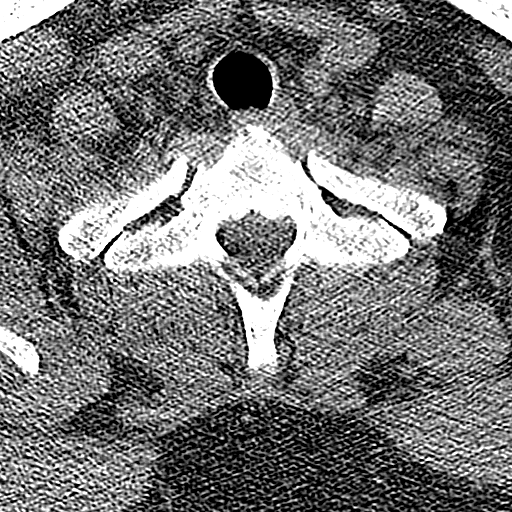
[im 14/84  bone]
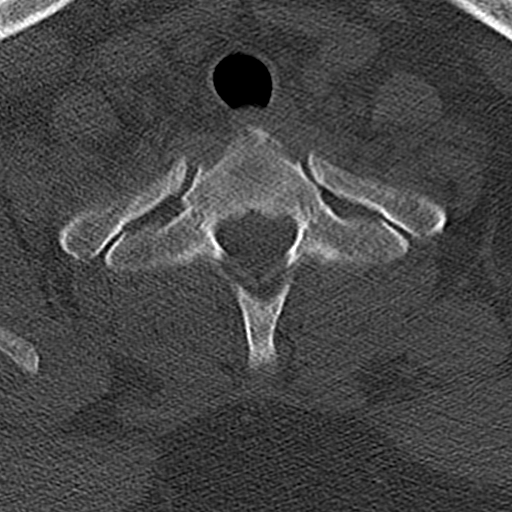
[im 28/84  bone]
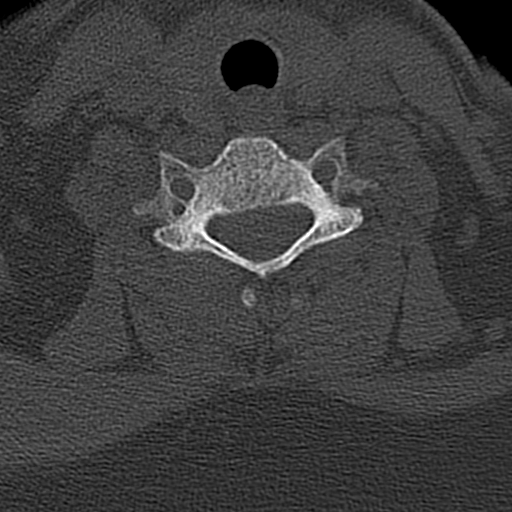
[im 42/84  bone]
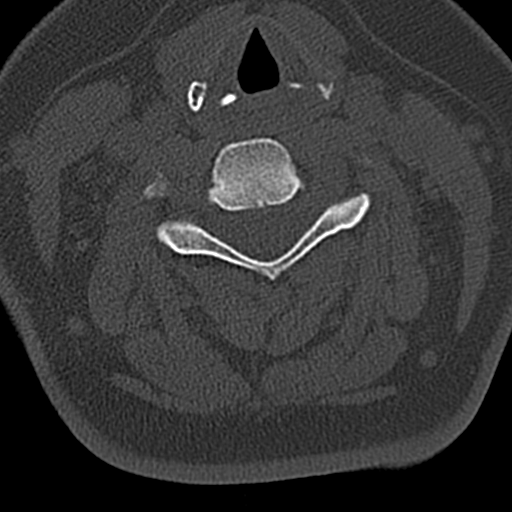
[im 56/84  bone]
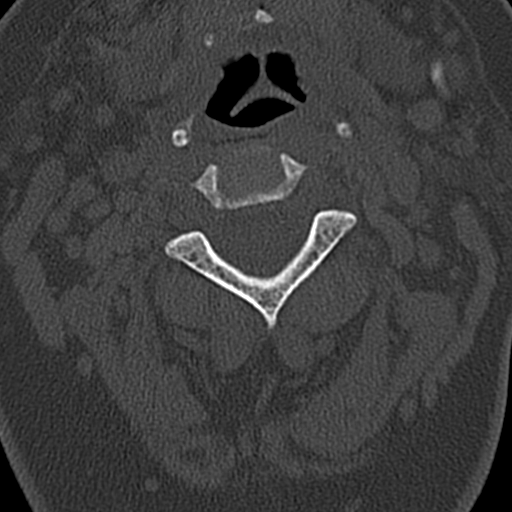
[im 70/84  soft-tissue]
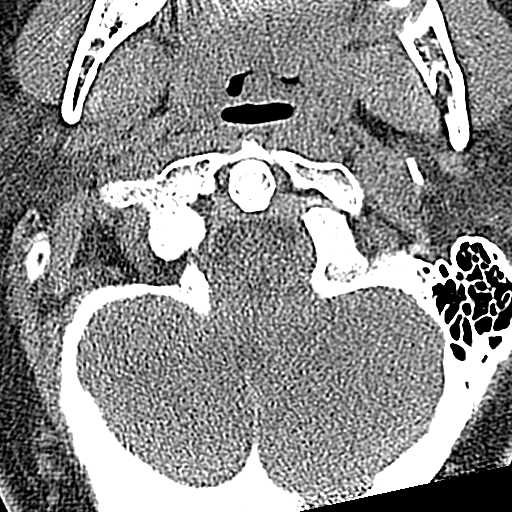
[im 70/84  bone]
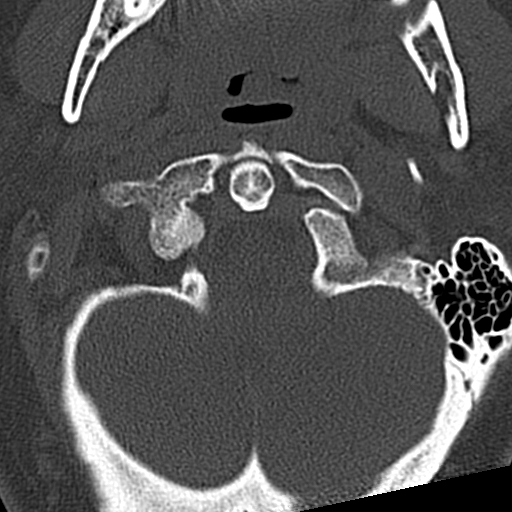

[13 of 33 positions shown; findings below may reference images not displayed]

FINDINGS: CT HEAD FINDINGS

Brain: No evidence of acute infarction, hemorrhage, hydrocephalus,
extra-axial collection or mass lesion/mass effect.

Vascular: No hyperdense vessel or unexpected calcification.

Skull: Normal. Negative for fracture or focal lesion.

Sinuses/Orbits: No acute finding.

Other: None.

CT CERVICAL SPINE FINDINGS

Alignment: Normal.

Skull base and vertebrae: No acute fracture. No primary bone lesion
or focal pathologic process.

Soft tissues and spinal canal: No prevertebral fluid or swelling. No
visible canal hematoma.

Disc levels: Mild degenerative disc disease is noted at C7-T1 with
anterior osteophyte formation.

Upper chest: Negative.

Other: None.
IMPRESSION: No acute intracranial abnormality seen.

Mild degenerative disc disease is noted at C7-T1. No acute
abnormality seen in the cervical spine.

## 2023-02-17 ENCOUNTER — Ambulatory Visit: Payer: Commercial Managed Care - PPO | Admitting: Nurse Practitioner

## 2023-02-26 ENCOUNTER — Other Ambulatory Visit: Payer: Self-pay | Admitting: Family Medicine

## 2023-02-26 DIAGNOSIS — I10 Essential (primary) hypertension: Secondary | ICD-10-CM

## 2023-03-03 ENCOUNTER — Encounter: Payer: Self-pay | Admitting: Nurse Practitioner

## 2023-03-04 NOTE — Telephone Encounter (Signed)
 Can we get her rescheduled?  I dont think she ever got her labs done for Fridays appt. so it can be done whenever.

## 2023-03-05 ENCOUNTER — Ambulatory Visit: Payer: Commercial Managed Care - PPO | Admitting: Nurse Practitioner

## 2023-03-05 NOTE — Telephone Encounter (Signed)
 See message.

## 2023-03-08 NOTE — Telephone Encounter (Signed)
 See patient message

## 2023-03-17 ENCOUNTER — Other Ambulatory Visit: Payer: Self-pay | Admitting: Urology

## 2023-03-19 ENCOUNTER — Encounter: Payer: Self-pay | Admitting: Family Medicine

## 2023-03-19 ENCOUNTER — Other Ambulatory Visit: Payer: Self-pay | Admitting: Family Medicine

## 2023-03-19 DIAGNOSIS — E559 Vitamin D deficiency, unspecified: Secondary | ICD-10-CM

## 2023-03-19 LAB — CBC WITH DIFFERENTIAL/PLATELET
Basophils Absolute: 0.1 10*3/uL (ref 0.0–0.2)
Basos: 1 %
EOS (ABSOLUTE): 0.2 10*3/uL (ref 0.0–0.4)
Eos: 2 %
Hematocrit: 45.5 % (ref 34.0–46.6)
Hemoglobin: 14.6 g/dL (ref 11.1–15.9)
Immature Grans (Abs): 0 10*3/uL (ref 0.0–0.1)
Immature Granulocytes: 0 %
Lymphocytes Absolute: 1.9 10*3/uL (ref 0.7–3.1)
Lymphs: 22 %
MCH: 28.6 pg (ref 26.6–33.0)
MCHC: 32.1 g/dL (ref 31.5–35.7)
MCV: 89 fL (ref 79–97)
Monocytes Absolute: 0.6 10*3/uL (ref 0.1–0.9)
Monocytes: 6 %
Neutrophils Absolute: 6 10*3/uL (ref 1.4–7.0)
Neutrophils: 69 %
Platelets: 311 10*3/uL (ref 150–450)
RBC: 5.1 x10E6/uL (ref 3.77–5.28)
RDW: 12.6 % (ref 11.7–15.4)
WBC: 8.8 10*3/uL (ref 3.4–10.8)

## 2023-03-19 LAB — HEMOGLOBIN A1C
Est. average glucose Bld gHb Est-mCnc: 111 mg/dL
Hgb A1c MFr Bld: 5.5 % (ref 4.8–5.6)

## 2023-03-19 LAB — CMP14+EGFR
ALT: 34 [IU]/L — ABNORMAL HIGH (ref 0–32)
AST: 23 [IU]/L (ref 0–40)
Albumin: 4.6 g/dL (ref 3.9–4.9)
Alkaline Phosphatase: 78 [IU]/L (ref 44–121)
BUN/Creatinine Ratio: 10 (ref 9–23)
BUN: 9 mg/dL (ref 6–20)
Bilirubin Total: 0.5 mg/dL (ref 0.0–1.2)
CO2: 23 mmol/L (ref 20–29)
Calcium: 9.5 mg/dL (ref 8.7–10.2)
Chloride: 102 mmol/L (ref 96–106)
Creatinine, Ser: 0.9 mg/dL (ref 0.57–1.00)
Globulin, Total: 2.7 g/dL (ref 1.5–4.5)
Glucose: 91 mg/dL (ref 70–99)
Potassium: 4.9 mmol/L (ref 3.5–5.2)
Sodium: 139 mmol/L (ref 134–144)
Total Protein: 7.3 g/dL (ref 6.0–8.5)
eGFR: 83 mL/min/{1.73_m2} (ref >59)

## 2023-03-19 LAB — LIPID PANEL
Chol/HDL Ratio: 4 {ratio} (ref 0.0–4.4)
Cholesterol, Total: 194 mg/dL (ref 100–199)
HDL: 48 mg/dL (ref >39)
LDL Chol Calc (NIH): 124 mg/dL — ABNORMAL HIGH (ref 0–99)
Triglycerides: 121 mg/dL (ref 0–149)
VLDL Cholesterol Cal: 22 mg/dL (ref 5–40)

## 2023-03-19 LAB — TSH+FREE T4
Free T4: 1.36 ng/dL (ref 0.82–1.77)
TSH: 2.37 u[IU]/mL (ref 0.450–4.500)

## 2023-03-19 LAB — T4, FREE: Free T4: 1.38 ng/dL (ref 0.82–1.77)

## 2023-03-19 LAB — VITAMIN D 25 HYDROXY (VIT D DEFICIENCY, FRACTURES): Vit D, 25-Hydroxy: 21.9 ng/mL — ABNORMAL LOW (ref 30.0–100.0)

## 2023-03-19 LAB — TSH: TSH: 2.56 u[IU]/mL (ref 0.450–4.500)

## 2023-03-19 MED ORDER — VITAMIN D (ERGOCALCIFEROL) 1.25 MG (50000 UNIT) PO CAPS
50000.0000 [IU] | ORAL_CAPSULE | ORAL | 1 refills | Status: AC
Start: 2023-03-19 — End: ?

## 2023-03-22 ENCOUNTER — Encounter: Payer: Self-pay | Admitting: Nurse Practitioner

## 2023-03-22 DIAGNOSIS — E039 Hypothyroidism, unspecified: Secondary | ICD-10-CM

## 2023-03-23 ENCOUNTER — Ambulatory Visit: Payer: Commercial Managed Care - PPO | Admitting: Nurse Practitioner

## 2023-03-23 DIAGNOSIS — E890 Postprocedural hypothyroidism: Secondary | ICD-10-CM

## 2023-03-23 MED ORDER — LEVOTHYROXINE SODIUM 175 MCG PO TABS: 175.0000 ug | ORAL_TABLET | Freq: Every day | ORAL | 1 refills | Status: DC

## 2023-03-23 NOTE — Telephone Encounter (Signed)
Pt was called and scheduled for May per [facility]

## 2023-03-23 NOTE — Telephone Encounter (Signed)
Can we reschedule patients appt (originally for today) for 4 months?  Her daughter is in the hospital.

## 2023-04-06 ENCOUNTER — Ambulatory Visit: Payer: Commercial Managed Care - PPO | Admitting: Family Medicine

## 2023-04-06 ENCOUNTER — Other Ambulatory Visit: Payer: Self-pay | Admitting: Family Medicine

## 2023-04-06 DIAGNOSIS — I10 Essential (primary) hypertension: Secondary | ICD-10-CM

## 2023-04-12 ENCOUNTER — Other Ambulatory Visit: Payer: Self-pay

## 2023-04-12 ENCOUNTER — Encounter (HOSPITAL_BASED_OUTPATIENT_CLINIC_OR_DEPARTMENT_OTHER): Payer: Self-pay | Admitting: Urology

## 2023-04-12 NOTE — Progress Notes (Signed)
 Spoke w/ via phone for pre-op interview: patient  Lab needs dos: none Lab results: EKG 08/10/22; CMP 03/18/23 COVID test: patient states asymptomatic no test needed. Arrive at [facility] 04/14/23 NPO after MN except clear liquids. Clear liquids from MN until 6AM Med rec completed. Medications to take morning of surgery: amlodipine , lamotrigine , and levothyroxine  Diabetic medication: NA Patient instructed no nail polish to be worn day of surgery. Patient instructed to bring photo id and insurance card day of surgery. Patient aware to have driver (ride ) / caregiver for 24 hours after surgery. husband to drive. Special Instructions: NA Patient verbalized understanding of instructions that were given at this phone interview. Patient denies shortness of breath, chest pain, fever, cough at this phone interview.

## 2023-04-14 ENCOUNTER — Encounter (HOSPITAL_BASED_OUTPATIENT_CLINIC_OR_DEPARTMENT_OTHER)
Admission: RE | Disposition: A | Discharge status: Home / Self Care | Payer: Self-pay | Source: Home / Self Care | Attending: Urology

## 2023-04-14 ENCOUNTER — Ambulatory Visit (HOSPITAL_BASED_OUTPATIENT_CLINIC_OR_DEPARTMENT_OTHER): Payer: Commercial Managed Care - PPO | Admitting: Anesthesiology

## 2023-04-14 ENCOUNTER — Encounter (HOSPITAL_BASED_OUTPATIENT_CLINIC_OR_DEPARTMENT_OTHER): Payer: Self-pay | Admitting: Urology

## 2023-04-14 ENCOUNTER — Ambulatory Visit (HOSPITAL_BASED_OUTPATIENT_CLINIC_OR_DEPARTMENT_OTHER)
Admission: RE | Admit: 2023-04-14 | Discharge: 2023-04-14 | Disposition: A | Discharge status: Home / Self Care | Payer: Commercial Managed Care - PPO | Attending: Urology | Admitting: Urology

## 2023-04-14 DIAGNOSIS — F419 Anxiety disorder, unspecified: Secondary | ICD-10-CM | POA: Insufficient documentation

## 2023-04-14 DIAGNOSIS — N200 Calculus of kidney: Secondary | ICD-10-CM | POA: Diagnosis not present

## 2023-04-14 DIAGNOSIS — N132 Hydronephrosis with renal and ureteral calculous obstruction: Secondary | ICD-10-CM | POA: Insufficient documentation

## 2023-04-14 DIAGNOSIS — I10 Essential (primary) hypertension: Secondary | ICD-10-CM | POA: Diagnosis not present

## 2023-04-14 DIAGNOSIS — I471 Supraventricular tachycardia, unspecified: Secondary | ICD-10-CM | POA: Diagnosis not present

## 2023-04-14 DIAGNOSIS — F418 Other specified anxiety disorders: Secondary | ICD-10-CM

## 2023-04-14 DIAGNOSIS — E039 Hypothyroidism, unspecified: Secondary | ICD-10-CM | POA: Insufficient documentation

## 2023-04-14 DIAGNOSIS — F32A Depression, unspecified: Secondary | ICD-10-CM | POA: Diagnosis not present

## 2023-04-14 DIAGNOSIS — Z87891 Personal history of nicotine dependence: Secondary | ICD-10-CM | POA: Diagnosis not present

## 2023-04-14 DIAGNOSIS — Z01818 Encounter for other preprocedural examination: Secondary | ICD-10-CM

## 2023-04-14 HISTORY — PX: CYSTOSCOPY/URETEROSCOPY/HOLMIUM LASER/STENT PLACEMENT: SHX6546

## 2023-04-14 SURGERY — CYSTOSCOPY/URETEROSCOPY/HOLMIUM LASER/STENT PLACEMENT
Anesthesia: General | Site: Ureter | Laterality: Bilateral

## 2023-04-14 MED ORDER — TRAMADOL HCL 50 MG PO TABS: 50.0000 mg | ORAL_TABLET | Freq: Four times a day (QID) | ORAL | 0 refills | Status: AC | PRN

## 2023-04-14 MED ORDER — PROPOFOL 10 MG/ML IV BOLUS
INTRAVENOUS | Status: DC | PRN
  Administered 2023-04-14: 200 mg via INTRAVENOUS
  Administered 2023-04-14: 20 mg via INTRAVENOUS

## 2023-04-14 MED ORDER — STERILE WATER FOR IRRIGATION IR SOLN
Status: DC | PRN
  Administered 2023-04-14: 1000 mL

## 2023-04-14 MED ORDER — LACTATED RINGERS IV SOLN: INTRAVENOUS | Status: DC

## 2023-04-14 MED ORDER — PHENAZOPYRIDINE HCL 200 MG PO TABS: 200.0000 mg | ORAL_TABLET | Freq: Three times a day (TID) | ORAL | 0 refills | Status: DC | PRN

## 2023-04-14 MED ORDER — MIDAZOLAM HCL 2 MG/2ML IJ SOLN
INTRAMUSCULAR | Status: DC | PRN
  Administered 2023-04-14: 2 mg via INTRAVENOUS

## 2023-04-14 MED ORDER — KETOROLAC TROMETHAMINE 30 MG/ML IJ SOLN
INTRAMUSCULAR | Status: DC | PRN
  Administered 2023-04-14: 30 mg via INTRAVENOUS

## 2023-04-14 MED ORDER — ONDANSETRON HCL 4 MG/2ML IJ SOLN: 4.0000 mg | Freq: Once | INTRAMUSCULAR | Status: DC | PRN

## 2023-04-14 MED ORDER — OXYBUTYNIN CHLORIDE 5 MG PO TABS: 5.0000 mg | ORAL_TABLET | Freq: Three times a day (TID) | ORAL | 1 refills | Status: DC | PRN

## 2023-04-14 MED ORDER — ONDANSETRON HCL 4 MG/2ML IJ SOLN
INTRAMUSCULAR | Status: DC | PRN
  Administered 2023-04-14: 4 mg via INTRAVENOUS

## 2023-04-14 MED ORDER — ACETAMINOPHEN 10 MG/ML IV SOLN: 1000.0000 mg | Freq: Once | INTRAVENOUS | Status: DC | PRN

## 2023-04-14 MED ORDER — CEFAZOLIN SODIUM-DEXTROSE 2-4 GM/100ML-% IV SOLN
INTRAVENOUS | Status: AC
  Filled 2023-04-14: qty 100

## 2023-04-14 MED ORDER — FENTANYL CITRATE (PF) 100 MCG/2ML IJ SOLN
25.0000 ug | INTRAMUSCULAR | Status: DC | PRN
  Administered 2023-04-14 (×2): 50 ug via INTRAVENOUS

## 2023-04-14 MED ORDER — MIDAZOLAM HCL 2 MG/2ML IJ SOLN
INTRAMUSCULAR | Status: AC
  Filled 2023-04-14: qty 2

## 2023-04-14 MED ORDER — SODIUM CHLORIDE 0.9 % IR SOLN
Status: DC | PRN
  Administered 2023-04-14 (×2): 3000 mL

## 2023-04-14 MED ORDER — FENTANYL CITRATE (PF) 100 MCG/2ML IJ SOLN
INTRAMUSCULAR | Status: DC | PRN
  Administered 2023-04-14: 25 ug via INTRAVENOUS
  Administered 2023-04-14: 50 ug via INTRAVENOUS
  Administered 2023-04-14 (×2): 25 ug via INTRAVENOUS

## 2023-04-14 MED ORDER — DEXMEDETOMIDINE HCL IN NACL 80 MCG/20ML IV SOLN
INTRAVENOUS | Status: DC | PRN
  Administered 2023-04-14: 8 ug via INTRAVENOUS

## 2023-04-14 MED ORDER — ACETAMINOPHEN 10 MG/ML IV SOLN
INTRAVENOUS | Status: DC | PRN
  Administered 2023-04-14: 1000 mg via INTRAVENOUS

## 2023-04-14 MED ORDER — OXYCODONE HCL 5 MG PO TABS: 5.0000 mg | ORAL_TABLET | Freq: Once | ORAL | Status: DC | PRN

## 2023-04-14 MED ORDER — CEFAZOLIN SODIUM-DEXTROSE 2-4 GM/100ML-% IV SOLN
2.0000 g | INTRAVENOUS | Status: AC
  Administered 2023-04-14: 2 g via INTRAVENOUS

## 2023-04-14 MED ORDER — FENTANYL CITRATE (PF) 100 MCG/2ML IJ SOLN
INTRAMUSCULAR | Status: AC
  Filled 2023-04-14: qty 2

## 2023-04-14 MED ORDER — DEXAMETHASONE SODIUM PHOSPHATE 4 MG/ML IJ SOLN
INTRAMUSCULAR | Status: DC | PRN
  Administered 2023-04-14: 8 mg via INTRAVENOUS

## 2023-04-14 MED ORDER — OXYCODONE HCL 5 MG/5ML PO SOLN: 5.0000 mg | Freq: Once | ORAL | Status: DC | PRN

## 2023-04-14 MED ORDER — LIDOCAINE HCL (CARDIAC) PF 100 MG/5ML IV SOSY
PREFILLED_SYRINGE | INTRAVENOUS | Status: DC | PRN
  Administered 2023-04-14: 50 mg via INTRAVENOUS

## 2023-04-14 MED ORDER — IOHEXOL 300 MG/ML  SOLN
INTRAMUSCULAR | Status: DC | PRN
  Administered 2023-04-14: 10 mL via URETHRAL

## 2023-04-14 MED ORDER — FENTANYL CITRATE (PF) 100 MCG/2ML IJ SOLN
INTRAMUSCULAR | Status: AC
Start: 2023-04-14 — End: ?
  Filled 2023-04-14: qty 2

## 2023-04-14 MED ORDER — CEPHALEXIN 500 MG PO CAPS: 500.0000 mg | ORAL_CAPSULE | Freq: Two times a day (BID) | ORAL | 0 refills | Status: AC

## 2023-04-14 MED ORDER — PROPOFOL 500 MG/50ML IV EMUL
INTRAVENOUS | Status: AC
  Filled 2023-04-14: qty 50

## 2023-04-14 SURGICAL SUPPLY — 26 items
BAG DRAIN URO-CYSTO SKYTR STRL (DRAIN) ×1 IMPLANT
BASKET STONE 1.7 NGAGE (UROLOGICAL SUPPLIES) IMPLANT
BASKET ZERO TIP NITINOL 2.4FR (BASKET) ×1 IMPLANT
BENZOIN TINCTURE PRP APPL 2/3 (GAUZE/BANDAGES/DRESSINGS) IMPLANT
CATH URETL OPEN 5X70 (CATHETERS) IMPLANT
CLOTH BEACON ORANGE TIMEOUT ST (SAFETY) ×1 IMPLANT
GLOVE BIO SURGEON STRL SZ7.5 (GLOVE) ×1 IMPLANT
GOWN STRL REUS W/TWL XL LVL3 (GOWN DISPOSABLE) ×1 IMPLANT
GUIDEWIRE STR DUAL SENSOR (WIRE) IMPLANT
GUIDEWIRE ZIPWRE .038 STRAIGHT (WIRE) ×1 IMPLANT
IV NS IRRIG 3000ML ARTHROMATIC (IV SOLUTION) ×2 IMPLANT
KIT TURNOVER CYSTO (KITS) ×1 IMPLANT
LASER FIB FLEXIVA PULSE ID 365 (Laser) IMPLANT
MANIFOLD NEPTUNE II (INSTRUMENTS) ×1 IMPLANT
NS IRRIG 500ML POUR BTL (IV SOLUTION) ×1 IMPLANT
PACK CYSTO (CUSTOM PROCEDURE TRAY) ×1 IMPLANT
SET IRRIG Y TYPE TUR BLADDER L (SET/KITS/TRAYS/PACK) IMPLANT
SHEATH NAVIGATOR HD 11/13X36 (SHEATH) IMPLANT
SLEEVE SCD COMPRESS KNEE MED (STOCKING) ×1 IMPLANT
STENT URET 6FRX24 CONTOUR (STENTS) IMPLANT
STRIP CLOSURE SKIN 1/2X4 (GAUZE/BANDAGES/DRESSINGS) IMPLANT
SYR 10ML LL (SYRINGE) ×1 IMPLANT
TRACTIP FLEXIVA PULS ID 200XHI (Laser) IMPLANT
TUBE CONNECTING 12X1/4 (SUCTIONS) IMPLANT
TUBING UROLOGY SET (TUBING) ×1 IMPLANT
WATER STERILE IRR 1000ML POUR (IV SOLUTION) IMPLANT

## 2023-04-14 NOTE — Anesthesia Postprocedure Evaluation (Signed)
Anesthesia Post Note  Patient: Dyanne Yorks Manahan  Procedure(s) Performed: CYSTOSCOPY/BILATERAL RETROGRADE PYELOGRAM/URETEROSCOPY/HOLMIUM LASER/STENT PLACEMENT (Bilateral: Ureter)     Patient location during evaluation: PACU Anesthesia Type: General Level of consciousness: awake and alert Pain management: pain level controlled Vital Signs Assessment: post-procedure vital signs reviewed and stable Respiratory status: spontaneous breathing, nonlabored ventilation, respiratory function stable and patient connected to nasal cannula oxygen Cardiovascular status: blood pressure returned to baseline and stable Postop Assessment: no apparent nausea or vomiting Anesthetic complications: no   No notable events documented.  Last Vitals:  Vitals:   04/14/23 1100 04/14/23 1130  BP: (!) 117/92 126/74  Pulse: 87   Resp: 12 16  Temp:  (!) 36.3 C  SpO2: 97% 99%    Last Pain:  Vitals:   04/14/23 1029  TempSrc:   PainSc: 0-No pain                 Mariann Barter

## 2023-04-14 NOTE — Anesthesia Procedure Notes (Signed)
Procedure Name: LMA Insertion Date/Time: 04/14/2023 9:09 AM  Performed by: Earmon Phoenix, CRNAPre-anesthesia Checklist: Patient identified, Emergency Drugs available, Suction available, Patient being monitored and Timeout performed Patient Re-evaluated:Patient Re-evaluated prior to induction Oxygen Delivery Method: Circle system utilized Preoxygenation: Pre-oxygenation with 100% oxygen Induction Type: IV induction Ventilation: Mask ventilation without difficulty LMA: LMA inserted LMA Size: 4.0 Number of attempts: 1 Placement Confirmation: positive ETCO2 and breath sounds checked- equal and bilateral Tube secured with: Tape Dental Injury: Teeth and Oropharynx as per pre-operative assessment

## 2023-04-14 NOTE — Op Note (Signed)
Operative Note  Preoperative diagnosis:  1.  Bilateral renal stones  Postoperative diagnosis: 1.  Bilateral renal stones  Procedure(s): 1.  Cystoscopy with bilateral ureteroscopy, holmium laser lithotripsy and bilateral JJ stent placement 2.  Bilateral retrograde pyelograms with intraoperative interpretation fluoroscopic imaging  Surgeon: Rhoderick Moody, MD  Assistants:  None  Anesthesia:  General  Complications:  None  EBL: Less than 5 mL  Specimens: 1.  Bilateral renal stone fragments  Drains/Catheters: 1.  Bilateral 6 French, 24 cm JJ stents without tethers  Intraoperative findings:   Solitary right collecting system with no filling defects or dilation involving the right ureter or right renal pelvis seen on retrograde pyelogram Solitary left collecting system with no filling defects or dilation involving the left ureter or left renal pelvis seen on retrograde pyelogram Multiple 3 to 5 mm stones were seen throughout both renal calyces  Indication:  Paula Myers is a 38 y.o. female with bilateral, nonobstructing renal stones.  She has been consented for the above procedures, voices understanding and wishes to proceed.  Description of procedure:  After informed consent was obtained, the patient was brought to the operating room and general LMA anesthesia was administered. The patient was then placed in the dorsolithotomy position and prepped and draped in the usual sterile fashion. A timeout was performed. A 23 French rigid cystoscope was then inserted into the urethral meatus and advanced into the bladder under direct vision. A complete bladder survey revealed no intravesical pathology.  A 5 French ureteral catheter was then inserted into the right ureteral orifice and a retrograde pyelogram was obtained, with the findings listed above.  A Glidewire was then used to intubate the lumen of the ureteral catheter and was advanced up to the right renal pelvis, under  fluoroscopic guidance.  The catheter was then removed, leaving the wire in place.  A sensor wire was then advanced alongside the Glidewire and up to the right renal pelvis, under fluoroscopic guidance.  An 11/13 [place], 35 cm ureteral access sheath was then advanced over the sensor wire and into good position within the proximal aspects of the right ureter, under fluoroscopic guidance.  A flexible ureteroscope was advanced through the access sheath and into the right renal pelvis where numerous 3 to 5 mm stones were seen throughout her renal calyces.  A 200 m holmium laser was then used to fracture the stones into less than 2 mm fragments.  An engage basket was then used to extract a few stone fragments for analysis.  The ureteral access sheath and flexible ureteroscope were then removed under direct vision, identifying no ureteral trauma or luminal stone burden.  A 6 French, 24 cm JJ stent was then advanced over the Glidewire and into good position within the right collecting system, confirming placement via fluoroscopy.  A 5 French ureteral catheter was then inserted into the left ureteral orifice and a retrograde pyelogram was obtained, with the findings listed above.  A Glidewire was then used to intubate the lumen of the ureteral catheter and was advanced up to the left renal pelvis, under fluoroscopic guidance.  The catheter was then removed, leaving the wire in place.  An additional sensor wire was then advanced alongside the Glidewire and up to the left renal pelvis, and fluoroscopic guidance.  A 11/13 French, 35 cm ureteral access sheath was then advanced over the sensor wire into the proximal aspects of the left ureter, under fluoroscopic guidance.  The flexible ureteroscope was then advanced through the  ureteral access sheath and into the left renal pelvis were numerous 3 to 5 mm stones were seen scattered throughout the renal calyces.  The 200 m holmium laser was then used to fracture the  stones into less than 2 mm fragments.  A few fragments were retrieved with the engage basket for stone analysis.  The flexible ureteroscope and ureteral access sheath were then removed under direct vision, identifying no evidence of ureteral trauma or luminal stone burden.  A 6 French, 24 cm JJ stent was then advanced over the Glidewire and into good position within the left collecting system.  The patient's bladder was then drained.  She tolerated the procedure well and was transferred to the postanesthesia in stable condition.  Plan: Follow-up in 1 week for office cystoscopy and bilateral stent removal

## 2023-04-14 NOTE — Anesthesia Preprocedure Evaluation (Signed)
Anesthesia Evaluation  Patient identified by MRN, date of birth, ID band Patient awake    Reviewed: Allergy & Precautions, NPO status , Patient's Chart, lab work & pertinent test results, reviewed documented beta blocker date and time   History of Anesthesia Complications Negative for: history of anesthetic complications  Airway Mallampati: III  TM Distance: >3 FB Neck ROM: Full    Dental no notable dental hx.    Pulmonary neg COPD, Current Smoker and Patient abstained from smoking.   breath sounds clear to auscultation       Cardiovascular hypertension, (-) CAD, (-) Past MI, (-) Cardiac Stents and (-) CABG + dysrhythmias Supra Ventricular Tachycardia  Rhythm:Regular Rate:Normal     Neuro/Psych  PSYCHIATRIC DISORDERS Anxiety Depression     Neuromuscular disease    GI/Hepatic ,neg GERD  ,,  Endo/Other  Hypothyroidism    Renal/GU Renal disease     Musculoskeletal   Abdominal   Peds  Hematology  (+) Blood dyscrasia, anemia   Anesthesia Other Findings   Reproductive/Obstetrics                              Anesthesia Physical Anesthesia Plan  ASA: 2  Anesthesia Plan: General   Post-op Pain Management:    Induction:   PONV Risk Score and Plan:   Airway Management Planned: LMA  Additional Equipment:   Intra-op Plan:   Post-operative Plan: Extubation in OR  Informed Consent: I have reviewed the patients History and Physical, chart, labs and discussed the procedure including the risks, benefits and alternatives for the proposed anesthesia with the patient or authorized representative who has indicated his/her understanding and acceptance.     Dental advisory given  Plan Discussed with: CRNA  Anesthesia Plan Comments:          Anesthesia Quick Evaluation

## 2023-04-14 NOTE — H&P (Signed)
Office Visit Report     04/06/2023   --------------------------------------------------------------------------------   Paula Siad. Myers  MRN: [id number]  DOB: 05-07-1985, 38 year old Female  PRIMARY CARE:     REFERRING:    PROVIDER:  Rhoderick Moody, M.D.  TREATING:  Anastasia Fiedler, P.A.  LOCATION:  [facility] [phone]     --------------------------------------------------------------------------------   CC/HPI: Kidney stones   [name] is a 38 year old female who was seen in the emergency department on 11/18/2022 due to acute right-sided flank pain. She was found to have a 2.4 mm right distal ureteral stone associated with mild right-sided hydronephrosis along with multiple bilateral nonobstructing renal stones. She has since passed her stones (brought it with her today) and reports that her pain has resolved.   -Family hx of stones: Mother and brother  -GI/Bone disease: denies  -Obesity/bariatric surgery/intestinal bypass: denies  -Hx of Gout: denies  -Recurrent UTIs: denies  -Hx of DMII: denies  -Topiramate or acetazolimide use: denies   04/06/23: Patient with history of bilateral nonobstructive renal stones presents today for preoperative history and physical exam in anticipation of undergoing bilateral ureteroscopy on 04/14/23 with Dr. Liliane Shi. Today she is doing well and without complaints. She denies gross hematuria, dysuria, fever/chills, flank pain, nausea/vomiting, chest pain, shortness of breath, headaches.     ALLERGIES: Levaquin TABS - Anaphylaxis SSNI - Other Reaction, Serotonin syndrome, sexual dysfunction SSRI's - Serotonin syndrome, sexual dysfunction Tdap Shot - Cellulites    MEDICATIONS: Hydrochlorothiazide 12.5 mg tablet  Levothyroxine Sodium  Amlodipine Besylate 10 mg tablet  Lamotrigine 100 mg tablet 1 tablet PO Daily  Phentermine Hcl 15 mg capsule 1 capsule PO Daily  Vitamin D2 1,250 mcg (50,000 unit) capsule 1 capsule PO Daily      GU PSH: No GU PSH      PSH Notes: Elbow Surgery, Cesarean Section  D and E 2009   NON-GU PSH: Cesarean Delivery Cesarean Delivery Only - 2008 Hysterectomy     GU PMH: Renal and ureteral calculus - 12/28/2022      PMH Notes:  1898-03-02 00:00:00 - Note: Normal Routine History And Physical Adult   NON-GU PMH: Personal history of other specified conditions, History of heartburn - 2014 Anxiety GERD Hypercholesterolemia Hypertension Hyperthyroidism    FAMILY HISTORY: Bladder disorder - Mother Chronic Interstitial Cystitis - Mother Family Health Status - Mother's Age - Mother Family Health Status Children is 1 daughter and 1 - Mother Hematuria - Mother nephrolithiasis - Mother, Brother   SOCIAL HISTORY: Marital Status: Married Preferred Language: English; Ethnicity: Not Hispanic Or Latino; Race: White Current Smoking Status: Patient does not smoke anymore.   Tobacco Use Assessment Completed: Used Tobacco in last 30 days? Does not drink anymore.  Drinks 4+ caffeinated drinks per day.     Notes: Tobacco Use, Caffeine Use, Alcohol Use, Marital History - Currently Married, Occupation:   REVIEW OF SYSTEMS:    GU Review Female:   Patient reports get up at night to urinate. Patient denies frequent urination, hard to postpone urination, burning /pain with urination, leakage of urine, stream starts and stops, trouble starting your stream, have to strain to urinate, and being pregnant.  Gastrointestinal (Upper):   Patient denies nausea, vomiting, and indigestion/ heartburn.  Gastrointestinal (Lower):   Patient denies diarrhea and constipation.  Constitutional:   Patient reports night sweats. Patient denies fever, weight loss, and fatigue.  Skin:   Patient denies skin rash/ lesion and itching.  Eyes:   Patient denies blurred vision and  double vision.  Ears/ Nose/ Throat:   Patient denies sore throat and sinus problems.  Hematologic/Lymphatic:   Patient denies swollen glands  and easy bruising.  Cardiovascular:   Patient denies leg swelling and chest pains.  Respiratory:   Patient denies cough and shortness of breath.  Endocrine:   Patient denies excessive thirst.  Musculoskeletal:   Patient denies back pain and joint pain.  Neurological:   Patient reports headaches. Patient denies dizziness.  Psychologic:   Patient denies depression and anxiety.   VITAL SIGNS:      04/06/2023 09:28 AM  BP 136/85 mmHg  Pulse 98 /min  Temperature 98.2 F / 36.7 C   MULTI-SYSTEM PHYSICAL EXAMINATION:    Constitutional: Well-nourished. No physical deformities. Normally developed. Good grooming.  Neck: Neck symmetrical, not swollen. Normal tracheal position.  Respiratory: No labored breathing, no use of accessory muscles.   Cardiovascular: Normal temperature, normal extremity pulses, no swelling, no varicosities.  Neurologic / Psychiatric: Oriented to time, oriented to place, oriented to person. No depression, no anxiety, no agitation.  Gastrointestinal: No mass, no tenderness, no rigidity, non obese abdomen.  Musculoskeletal: Normal gait and station of head and neck.     Complexity of Data:  Source Of History:  Patient, Medical Record Summary  Records Review:   Previous Doctor Records, Previous Patient Records  Urine Test Review:   Urinalysis  X-Ray Review: C.T. Abdomen/Pelvis: Reviewed Report.     04/06/23  Urinalysis  Urine Appearance Clear   Urine Color Yellow   Urine Glucose Neg mg/dL  Urine Bilirubin Neg mg/dL  Urine Ketones Neg mg/dL  Urine Specific Gravity 1.015   Urine Blood Neg ery/uL  Urine pH 6.5   Urine Protein Trace mg/dL  Urine Urobilinogen 0.2 mg/dL  Urine Nitrites Neg   Urine Leukocyte Esterase Neg leu/uL   PROCEDURES:          Visit Complexity - G2211          Urinalysis Dipstick Dipstick Cont'd  Color: Yellow Bilirubin: Neg mg/dL  Appearance: Clear Ketones: Neg mg/dL  Specific Gravity: 2.956 Blood: Neg ery/uL  pH: 6.5 Protein: Trace  mg/dL  Glucose: Neg mg/dL Urobilinogen: 0.2 mg/dL    Nitrites: Neg    Leukocyte Esterase: Neg leu/uL    ASSESSMENT:      ICD-10 Details  1 GU:   Renal and ureteral calculus - N20.2 Chronic, Stable   PLAN:           Orders Labs Urine Culture          Schedule Return Visit/Planned Activity: Keep Scheduled Appointment          Document Letter(s):  Created for Patient: Clinical Summary         Notes:   Culture sent. There are no changes in the patients history or physical exam since last evaluation. Pt is scheduled to undergo bilateral ureteroscopy on 04/14/2023.   All questions were answered to the best of my ability.    The risks, benefits and alternatives of cystoscopy with BILATERAL ureteroscopy, laser lithotripsy and ureteral stent placement was discussed the patient.  Risks included, but are not limited to: bleeding, urinary tract infection, ureteral injury/avulsion, ureteral stricture formation, retained stone fragments, the possibility that multiple surgeries may be required to treat the stone(s), MI, stroke, PE and the inherent risks of general anesthesia.  The patient voices understanding and wishes to proceed.

## 2023-04-14 NOTE — Discharge Instructions (Signed)
 Post Anesthesia Home Care Instructions  Activity: Get plenty of rest for the remainder of the day. A responsible individual must stay with you for 24 hours following the procedure.  For the next 24 hours, DO NOT: -Drive a car -Advertising copywriter -Drink alcoholic beverages -Take any medication unless instructed by your physician -Make any legal decisions or sign important papers.  Meals: Start with liquid foods such as gelatin or soup. Progress to regular foods as tolerated. Avoid greasy, spicy, heavy foods. If nausea and/or vomiting occur, drink only clear liquids until the nausea and/or vomiting subsides. Call your physician if vomiting continues.  Special Instructions/Symptoms: Your throat may feel dry or sore from the anesthesia or the breathing tube placed in your throat during surgery. If this causes discomfort, gargle with warm salt water. The discomfort should disappear within 24 hours.  [facility] [phone] Post Ureteroscopy With Stent Instructions  Definitions:  Ureter: The duct that transports urine from the kidney to the bladder. Stent:   A plastic hollow tube that is placed into the ureter, from the kidney to the bladder to prevent the ureter from swelling shut.  GENERAL INSTRUCTIONS:  Despite the fact that no skin incisions were used, the area around the ureter and bladder is raw and irritated. The stent is a foreign body which will further irritate the bladder wall. This irritation is manifested by increased frequency of urination, both day and night, and by an increase in the urge to urinate. In some, the urge to urinate is present almost always. Sometimes the urge is strong enough that you may not be able to stop yourself from urinating. The only real cure is to remove the stent and then give time for the bladder wall to heal which can't be done until the danger of the ureter swelling shut has passed, which varies.  You may see some blood in your  urine while the stent is in place and a few days afterwards. Do not be alarmed, even if the urine was clear for a while. Get off your feet and drink lots of fluids until clearing occurs. If you start to pass clots or don't improve, call us.  DIET: You may return to your normal diet immediately. Because of the raw surface of your bladder, alcohol, spicy foods, acid type foods and drinks with caffeine may cause irritation or frequency and should be used in moderation. To keep your urine flowing freely and to avoid constipation, drink plenty of fluids during the day ( 8-10 glasses ). Tip: Avoid cranberry juice because it is very acidic.  ACTIVITY: Your physical activity doesn't need to be restricted. However, if you are very active, you may see some blood in your urine. We suggest that you reduce your activity under these circumstances until the bleeding has stopped.  BOWELS: It is important to keep your bowels regular during the postoperative period. Straining with bowel movements can cause bleeding. A bowel movement every other day is reasonable. Use a mild laxative if needed, such as Milk of Magnesia 2-3 tablespoons, or 2 Dulcolax tablets. Call if you continue to have problems. If you have been taking narcotics for pain, before, during or after your surgery, you may be constipated. Take a laxative if necessary.   MEDICATION: You should resume your pre-surgery medications unless told not to. In addition you will often be given an antibiotic to prevent infection. These should be taken as prescribed until the bottles are finished unless you are having an unusual reaction  to one of the drugs.  PROBLEMS YOU SHOULD REPORT TO [place]:  Fevers over 100.5 Fahrenheit.  Heavy bleeding, or clots ( See above notes about blood in urine ).  Inability to urinate.  Drug reactions ( hives, rash, nausea, vomiting, diarrhea ).  Severe burning or pain with urination that is not improving.  FOLLOW-UP: You will  need a follow-up appointment to monitor your progress. Call for this appointment at the number listed above. Usually the first appointment will be about three to fourteen days after your surgery.

## 2023-04-14 NOTE — Transfer of Care (Signed)
Immediate Anesthesia Transfer of Care Note  Patient: Paula Myers  Procedure(s) Performed: CYSTOSCOPY/BILATERAL RETROGRADE PYELOGRAM/URETEROSCOPY/HOLMIUM LASER/STENT PLACEMENT (Bilateral: Ureter)  Patient Location: PACU  Anesthesia Type:General  Level of Consciousness: awake, alert , oriented, and patient cooperative  Airway & Oxygen Therapy: Patient Spontanous Breathing and Patient connected to nasal cannula oxygen  Post-op Assessment: Report given to RN and Post -op Vital signs reviewed and stable  Post vital signs: Reviewed and stable  Last Vitals:  Vitals Value Taken Time  BP 139/89 04/14/23 1025  Temp    Pulse 86 04/14/23 1026  Resp 30 04/14/23 1026  SpO2 97 % 04/14/23 1026  Vitals shown include unfiled device data.  Last Pain:  Vitals:   04/14/23 0814  TempSrc: Oral  PainSc: 0-No pain      Patients Stated Pain Goal: 5 (04/14/23 1610)  Complications: No notable events documented.

## 2023-04-15 ENCOUNTER — Encounter (HOSPITAL_BASED_OUTPATIENT_CLINIC_OR_DEPARTMENT_OTHER): Payer: Self-pay | Admitting: Urology

## 2023-05-03 ENCOUNTER — Encounter: Payer: Self-pay | Admitting: Family Medicine

## 2023-05-05 ENCOUNTER — Other Ambulatory Visit: Payer: Self-pay | Admitting: Family Medicine

## 2023-05-05 MED ORDER — PHENTERMINE HCL 15 MG PO CAPS: 15.0000 mg | ORAL_CAPSULE | ORAL | 0 refills | Status: DC

## 2023-05-07 ENCOUNTER — Other Ambulatory Visit: Payer: Self-pay | Admitting: Family Medicine

## 2023-05-07 DIAGNOSIS — I10 Essential (primary) hypertension: Secondary | ICD-10-CM

## 2023-05-10 ENCOUNTER — Other Ambulatory Visit: Payer: Self-pay | Admitting: Nurse Practitioner

## 2023-05-10 DIAGNOSIS — E039 Hypothyroidism, unspecified: Secondary | ICD-10-CM

## 2023-05-27 ENCOUNTER — Encounter (HOSPITAL_COMMUNITY): Payer: Self-pay | Admitting: Hematology

## 2023-05-31 ENCOUNTER — Ambulatory Visit: Payer: Commercial Managed Care - PPO | Admitting: Family Medicine

## 2023-06-01 ENCOUNTER — Telehealth (INDEPENDENT_AMBULATORY_CARE_PROVIDER_SITE_OTHER): Payer: Commercial Managed Care - PPO | Admitting: Psychiatry

## 2023-06-01 ENCOUNTER — Telehealth (HOSPITAL_COMMUNITY): Payer: Self-pay

## 2023-06-01 ENCOUNTER — Encounter (HOSPITAL_COMMUNITY): Payer: Self-pay | Admitting: Psychiatry

## 2023-06-01 DIAGNOSIS — G4709 Other insomnia: Secondary | ICD-10-CM

## 2023-06-01 DIAGNOSIS — F4310 Post-traumatic stress disorder, unspecified: Secondary | ICD-10-CM | POA: Diagnosis not present

## 2023-06-01 DIAGNOSIS — F410 Panic disorder [episodic paroxysmal anxiety] without agoraphobia: Secondary | ICD-10-CM

## 2023-06-01 DIAGNOSIS — F331 Major depressive disorder, recurrent, moderate: Secondary | ICD-10-CM | POA: Diagnosis not present

## 2023-06-01 DIAGNOSIS — F17290 Nicotine dependence, other tobacco product, uncomplicated: Secondary | ICD-10-CM

## 2023-06-01 DIAGNOSIS — F411 Generalized anxiety disorder: Secondary | ICD-10-CM

## 2023-06-01 DIAGNOSIS — Z720 Tobacco use: Secondary | ICD-10-CM

## 2023-06-01 DIAGNOSIS — E559 Vitamin D deficiency, unspecified: Secondary | ICD-10-CM

## 2023-06-01 MED ORDER — VORTIOXETINE HBR 5 MG PO TABS: 5.0000 mg | ORAL_TABLET | Freq: Every day | ORAL | 2 refills | Status: DC

## 2023-06-01 MED ORDER — LAMOTRIGINE 100 MG PO TABS: 100.0000 mg | ORAL_TABLET | Freq: Every day | ORAL | 3 refills | Status: DC

## 2023-06-01 NOTE — Telephone Encounter (Signed)
 Prior authorization for pt's Trintellix CaseId:[id number];Status:Approved;Review Type:Prior Auth;Coverage Start Date:05/02/2023;Coverage End Date:05/31/2024;

## 2023-06-01 NOTE — Patient Instructions (Signed)
 We started Trintellix (vortioxetine) 5 mg once daily to your regimen.  It is going to be too expensive please let me know and we will likely increase lamotrigine to 150 mg nightly instead.
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
## 2023-06-10 ENCOUNTER — Other Ambulatory Visit (HOSPITAL_COMMUNITY): Payer: Self-pay | Admitting: Psychiatry

## 2023-06-10 ENCOUNTER — Ambulatory Visit: Admitting: Family Medicine

## 2023-06-10 ENCOUNTER — Encounter: Payer: Self-pay | Admitting: Family Medicine

## 2023-06-10 VITALS — BP 117/75 | HR 96 | Temp 97.9°F | Ht 65.0 in | Wt 250.2 lb

## 2023-06-10 DIAGNOSIS — I10 Essential (primary) hypertension: Secondary | ICD-10-CM

## 2023-06-10 DIAGNOSIS — R911 Solitary pulmonary nodule: Secondary | ICD-10-CM | POA: Diagnosis not present

## 2023-06-10 DIAGNOSIS — F411 Generalized anxiety disorder: Secondary | ICD-10-CM

## 2023-06-10 DIAGNOSIS — N200 Calculus of kidney: Secondary | ICD-10-CM | POA: Insufficient documentation

## 2023-06-10 DIAGNOSIS — F331 Major depressive disorder, recurrent, moderate: Secondary | ICD-10-CM

## 2023-06-10 DIAGNOSIS — E785 Hyperlipidemia, unspecified: Secondary | ICD-10-CM | POA: Insufficient documentation

## 2023-06-10 DIAGNOSIS — K760 Fatty (change of) liver, not elsewhere classified: Secondary | ICD-10-CM | POA: Insufficient documentation

## 2023-06-10 DIAGNOSIS — F410 Panic disorder [episodic paroxysmal anxiety] without agoraphobia: Secondary | ICD-10-CM

## 2023-06-10 MED ORDER — TIRZEPATIDE-WEIGHT MANAGEMENT 2.5 MG/0.5ML ~~LOC~~ SOLN
2.5000 mg | SUBCUTANEOUS | 0 refills | Status: DC
Start: 2023-06-10 — End: 2023-09-15

## 2023-06-10 MED ORDER — HYDROXYZINE PAMOATE 25 MG PO CAPS: 25.0000 mg | ORAL_CAPSULE | Freq: Three times a day (TID) | ORAL | 1 refills | Status: DC | PRN

## 2023-06-10 MED ORDER — TIRZEPATIDE-WEIGHT MANAGEMENT 5 MG/0.5ML ~~LOC~~ SOLN: 5.0000 mg | SUBCUTANEOUS | 0 refills | Status: DC

## 2023-06-10 MED ORDER — TIRZEPATIDE-WEIGHT MANAGEMENT 7.5 MG/0.5ML ~~LOC~~ SOLN: 7.5000 mg | SUBCUTANEOUS | 0 refills | Status: DC

## 2023-06-10 NOTE — Assessment & Plan Note (Signed)
Uncontrolled. Trial of hydroxyzine.

## 2023-06-10 NOTE — Progress Notes (Signed)
 Subjective:  Patient ID: Paula Myers, female    DOB: [birth year]  Age: 38 y.o. MRN: [id number]  CC:   Chief Complaint  Patient presents with   New Patient (Initial Visit)    Wants to discuss lung nodules on CT scan  Anxiety  Weight loss     HPI:  38 year old female with the below mentioned medical problems presents to establish care with me.  She has a few concerns today.  Patient has CT scan in September 2024 for kidney stone.  In addition to kidney stones, it revealed hepatic steatosis and a sub-4 mm nodule in the inferior lingula.  Follow-up was recommended if patient is at high risk.  Patient is a former smoker and currently vapes.  She states that she smoked from the age of 64 until she quit in February 2024.  Will discuss today.  Patient follows with psychiatry.  She continues to have significant anxiety.  She states that she has been unable to tolerate numerous medications.  These are listed in the note from psychiatry.  She has not tried hydroxyzine.  Will discuss this today.  Patient also wants to discuss the weight loss, particular weight loss medication.  She has previously been on Wegovy and phentermine.  She would like to start Zepbound to aid in weight loss.  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 06/17/2021   Hypertension 04/10/2021   Iron deficiency anemia due to chronic blood loss 11/21/2020   Cervical high risk HPV (human papillomavirus) test positive 07/31/2016   Major depressive  disorder, recurrent episode, moderate (HCC) 10/02/2015    Social Hx   Social History   Socioeconomic History   Marital status: Married    Spouse name: Not on file   Number of children: 3   Years of education: Not on file   Highest education level: Associate degree: occupational, Scientist, product/process development, or vocational program  Occupational History   Occupation: [employer]    Comment: Dispatcher  Tobacco Use   Smoking status: Every Day    Types: E-cigarettes   Smokeless tobacco: Never   Tobacco comments:    Quit cigarettes in February 2024. Vape cartridge currently lasts 1 month at a time  Vaping Use   Vaping status: Every Day  Substance and Sexual Activity   Alcohol use: Not Currently    Comment: maybe 1-2 per year   Drug use: Never   Sexual activity: Yes    Birth control/protection: Surgical    Comment: hyst  Other Topics Concern   Not on file  Social History Narrative   Not on file   Social Drivers of Health   Financial Resource Strain: Low Risk  (02/08/2023)   Overall Financial Resource Strain (CARDIA)    Difficulty of Paying Living Expenses: Not very hard  Food Insecurity: No Food Insecurity (02/08/2023)   Hunger Vital Sign    Worried About Running Out of Food in the Last Year: Never true    Ran Out of Food in the Last Year: Never true  Transportation Needs: No Transportation Needs (  02/08/2023)   PRAPARE - Administrator, Civil Service (Medical): No    Lack of Transportation (Non-Medical): No  Physical Activity: Insufficiently Active (02/08/2023)   Exercise Vital Sign    Days of Exercise per Week: 3 days    Minutes of Exercise per Session: 30 min  Stress: Stress Concern Present (02/08/2023)   [employer] of Occupational Health - Occupational Stress Questionnaire    Feeling of Stress : To some extent  Social Connections: Moderately Integrated (02/08/2023)   Social Connection and Isolation Panel [NHANES]    Frequency of Communication with Friends  and Family: Three times a week    Frequency of Social Gatherings with Friends and Family: Once a week    Attends Religious Services: 1 to 4 times per year    Active Member of [employer] or Organizations: No    Attends Engineer, structural: Not on file    Marital Status: Married    Review of Systems Per HPI  Objective:  BP 117/75   Pulse 96   Temp 97.9 F (36.6 C)   Ht 5\' 5"  (1.651 m)   Wt 250 lb 3.2 oz (113.5 kg)   LMP 04/30/2021 Comment: Negative HCG on 05/09/2021  SpO2 99%   BMI 41.64 kg/m      06/10/2023    9:02 AM 04/14/2023   11:30 AM 04/14/2023   11:00 AM  BP/Weight  Systolic BP 117 126 117  Diastolic BP 75 74 92  Wt. (Lbs) 250.2    BMI 41.64 kg/m2      Physical Exam Vitals and nursing note reviewed.  Constitutional:      General: She is not in acute distress.    Appearance: Normal appearance. She is obese.  HENT:     Head: Normocephalic and atraumatic.  Eyes:     General:        Right eye: No discharge.        Left eye: No discharge.     Conjunctiva/sclera: Conjunctivae normal.  Cardiovascular:     Rate and Rhythm: Normal rate and regular rhythm.  Pulmonary:     Effort: Pulmonary effort is normal.     Breath sounds: Normal breath sounds. No wheezing, rhonchi or rales.  Neurological:     Mental Status: She is alert.  Psychiatric:        Mood and Affect: Mood normal.        Behavior: Behavior normal.     Lab Results  Component Value Date   WBC 8.8 03/18/2023   HGB 14.6 03/18/2023   HCT 45.5 03/18/2023   PLT 311 03/18/2023   GLUCOSE 91 03/18/2023   CHOL 194 03/18/2023   TRIG 121 03/18/2023   HDL 48 03/18/2023   LDLCALC 124 (H) 03/18/2023   ALT 34 (H) 03/18/2023   AST 23 03/18/2023   NA 139 03/18/2023   K 4.9 03/18/2023   CL 102 03/18/2023   CREATININE 0.90 03/18/2023   BUN 9 03/18/2023   CO2 23 03/18/2023   TSH 2.560 03/18/2023   INR 1.1 10/27/2020   HGBA1C 5.5 03/18/2023     Assessment & Plan:  Pulmonary nodule Assessment &  Plan: We discussed this today.  Will revisit at follow-up.  Given her smoking history I think it is warranted to obtain a follow-up CT chest 1 year after prior exam.   Hypertension, unspecified type Assessment & Plan: BP stable on amlodipine.  Continue.   Morbid obesity (HCC) Assessment & Plan: After discussion with  the patient today, I have sent in Zepbound.  Orders: -     Tirzepatide-Weight Management; Inject 2.5 mg into the skin once a week.  Dispense: 2 mL; Refill: 0 -     Tirzepatide-Weight Management; Inject 5 mg into the skin once a week. Start after 1 month on 2.5 mg dose.  Dispense: 2 mL; Refill: 0 -     Tirzepatide-Weight Management; Inject 7.5 mg into the skin once a week. Start after 1 month of 5 mg dose.  Dispense: 2 mL; Refill: 0  Generalized anxiety disorder with panic attacks Assessment & Plan: Uncontrolled.  Trial of hydroxyzine.  Orders: -     hydrOXYzine Pamoate; Take 1 capsule (25 mg total) by mouth every 8 (eight) hours as needed for anxiety.  Dispense: 30 capsule; Refill: 1    Follow-up: 3 months  Koryn Charlot Adriana Simas DO [facility]

## 2023-06-10 NOTE — Assessment & Plan Note (Signed)
 BP stable on amlodipine.  Continue.

## 2023-06-10 NOTE — Patient Instructions (Signed)
 Hydroxyzine as directed.  Zepbound sent in.  Follow up in 3 months.

## 2023-06-10 NOTE — Assessment & Plan Note (Signed)
 We discussed this today.  Will revisit at follow-up.  Given her smoking history I think it is warranted to obtain a follow-up CT chest 1 year after prior exam.

## 2023-06-10 NOTE — Assessment & Plan Note (Signed)
 After discussion with the patient today, I have sent in Zepbound.

## 2023-07-05 ENCOUNTER — Ambulatory Visit
Admission: EM | Admit: 2023-07-05 | Discharge: 2023-07-05 | Disposition: A | Discharge status: Home / Self Care | Attending: Nurse Practitioner | Admitting: Nurse Practitioner

## 2023-07-05 DIAGNOSIS — J039 Acute tonsillitis, unspecified: Secondary | ICD-10-CM | POA: Insufficient documentation

## 2023-07-05 LAB — POC COVID19/FLU A&B COMBO
Covid Antigen, POC: NEGATIVE
Influenza A Antigen, POC: NEGATIVE
Influenza B Antigen, POC: NEGATIVE

## 2023-07-05 LAB — POCT MONO SCREEN (KUC): Mono, POC: NEGATIVE

## 2023-07-05 LAB — POCT RAPID STREP A (OFFICE): Rapid Strep A Screen: NEGATIVE

## 2023-07-05 MED ORDER — AMOXICILLIN-POT CLAVULANATE 875-125 MG PO TABS: 1.0000 | ORAL_TABLET | Freq: Two times a day (BID) | ORAL | 0 refills | Status: DC

## 2023-07-05 MED ORDER — LIDOCAINE VISCOUS HCL 2 % MT SOLN: OROMUCOSAL | 0 refills | Status: DC

## 2023-07-05 NOTE — ED Provider Notes (Signed)
 [facility]    CSN: [id number] Arrival date & time: 07/05/23  0802      History   Chief Complaint No chief complaint on file.   HPI Paula Myers is a 38 y.o. female.   The history is provided by the patient.   Patient presents with a 3-day history of fever, body aches, and sore throat.  Patient reports Tmax 103, which was 3 days ago and symptoms started.  She denies headache, ear pain, nasal congestion, runny nose, cough, abdominal pain, nausea, vomiting, diarrhea, or rash.  Reports she has been taking over-the-counter Tylenol  and ibuprofen  for symptoms.  Patient states that she has had both mono and strep going around at her job.    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   Depression    Dysrhythmia    "skipping" HR at beginning of pregnancy   Encounter for annual general medical examination with abnormal findings in adult 12/18/2021   Encounter for general adult medical examination with abnormal findings 10/07/2020   H/O cervical polypectomy    Heartburn during pregnancy    History of cesarean section 08/22/2014   Hypertension 2005   while in labor   Hyperthyroidism    Iron  deficiency anemia due to chronic blood loss 11/21/2020   Kidney stone    Need for immunization against influenza 12/18/2021   Palpitation    Screening due 10/07/2020   SVT (supraventricular tachycardia) (HCC)    SVT (supraventricular tachycardia) (HCC)    Tachycardia    Wound, open, nose 10/07/2020    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   PTSD (post-traumatic stress disorder) 09/15/2022   Vitamin D  deficiency 09/15/2022   Hypothyroidism 05/11/2022   Generalized anxiety disorder with panic attacks  05/11/2022   Morbid obesity (HCC) 06/17/2021   Vapes nicotine containing substance with history of cigarette smoking 06/17/2021   Hypertension 04/10/2021   Iron  deficiency anemia due to chronic blood loss 11/21/2020   Cervical high risk HPV (human papillomavirus) test positive 07/31/2016   Major depressive disorder, recurrent episode, moderate (HCC) 10/02/2015    Past Surgical History:  Procedure Laterality Date   ABDOMINAL HYSTERECTOMY N/A 05/13/2021   Procedure: HYSTERECTOMY ABDOMINAL;  Surgeon: Keene Pastures, DO;  Location: [facility];  Service: Gynecology;  Laterality: N/A;   CESAREAN SECTION     06/11/09; 05/25/2003   CESAREAN SECTION WITH BILATERAL TUBAL LIGATION Bilateral 08/22/2014   Procedure: CESAREAN SECTION WITH BILATERAL TUBAL LIGATION;  Surgeon: Artemisa Bile, MD;  Location: [facility];  Service: Obstetrics;  Laterality: Bilateral;   CYSTOSCOPY/URETEROSCOPY/HOLMIUM LASER/STENT PLACEMENT Bilateral 04/14/2023   Procedure: CYSTOSCOPY/BILATERAL RETROGRADE PYELOGRAM/URETEROSCOPY/HOLMIUM LASER/STENT PLACEMENT;  Surgeon: Adelbert Homans, MD;  Location: [facility];  Service: Urology;  Laterality: Bilateral;   DILATION AND CURETTAGE OF UTERUS  01/2008   ELBOW SURGERY     Left    OB History     Gravida  4   Para  3   Term  3   Preterm      AB  1   Living  3      SAB  1   IAB      Ectopic      Multiple  0  Live Births  3            Home Medications    Prior to Admission medications   Medication Sig Start Date End Date Taking? Authorizing Provider  acetaminophen  (TYLENOL ) 325 MG tablet Take 2 tablets (650 mg total) by mouth every 6 (six) hours as needed for moderate pain or mild pain. 05/13/21   Ozan, Jennifer, DO  amLODipine  (NORVASC ) 5 MG tablet TAKE 1 TABLET(5 MG) BY MOUTH DAILY 05/07/23   Zarwolo, Gloria, FNP  benzoyl peroxide -erythromycin  (BENZAMYCIN) gel Apply topically 2 (two) times daily. 09/09/22   Zarwolo, Gloria, FNP   hydrochlorothiazide  (HYDRODIURIL ) 12.5 MG tablet TAKE 1 TABLET(12.5 MG) BY MOUTH DAILY 04/06/23   Zarwolo, Gloria, FNP  hydrOXYzine  (VISTARIL ) 25 MG capsule Take 1 capsule (25 mg total) by mouth every 8 (eight) hours as needed for anxiety. 06/10/23   Cook, Jayce G, DO  ibuprofen  (ADVIL ) 800 MG tablet Take 1 tablet (800 mg total) by mouth 3 (three) times daily. 12/30/22   Leath-Warren, Belen Bowers, NP  lamoTRIgine  (LAMICTAL ) 100 MG tablet TAKE 1 TABLET(100 MG) BY MOUTH DAILY 06/15/23   Madie Schilling, MD  levothyroxine  (SYNTHROID ) 175 MCG tablet TAKE 1 TABLET(175 MCG) BY MOUTH DAILY BEFORE BREAKFAST 05/11/23   Wendel Hals, NP  tirzepatide (ZEPBOUND) 2.5 MG/0.5ML injection vial Inject 2.5 mg into the skin once a week. 06/10/23   Cook, Jayce G, DO  tirzepatide 5 MG/0.5ML injection vial Inject 5 mg into the skin once a week. Start after 1 month on 2.5 mg dose. 06/10/23   Cook, Jayce G, DO  tirzepatide 7.5 MG/0.5ML injection vial Inject 7.5 mg into the skin once a week. Start after 1 month of 5 mg dose. 06/10/23   Cook, Jayce G, DO  tretinoin  (RETIN-A ) 0.025 % cream Apply topically at bedtime. 09/09/22   Zarwolo, Gloria, FNP  Vitamin D , Ergocalciferol , (DRISDOL ) 1.25 MG (50000 UNIT) CAPS capsule Take 1 capsule (50,000 Units total) by mouth every 7 (seven) days. 03/19/23   Zarwolo, Gloria, FNP    Family History Family History  Problem Relation Age of Onset   Cancer Paternal Grandfather        thyroid    Cancer Paternal Grandmother        ovarian   Diabetes Maternal Grandmother    Hypertension Maternal Grandmother    Heart disease Maternal Grandmother    Hypertension Mother    Diabetes Mother    Diabetes Brother     Social History Social History   Tobacco Use   Smoking status: Every Day    Types: E-cigarettes   Smokeless tobacco: Never   Tobacco comments:    Quit cigarettes in February 2024. Vape cartridge currently lasts 1 month at a time  Vaping Use   Vaping status: Every Day   Substance Use Topics   Alcohol use: Not Currently    Comment: maybe 1-2 per year   Drug use: Never     Allergies   Levaquin [levofloxacin in d5w], Levofloxacin, Pertussis vaccines, Megace  [megestrol ], Tetanus-diphth-acell pertussis, and Venofer  [iron  sucrose]   Review of Systems Review of Systems Per HPI  Physical Exam Triage Vital Signs ED Triage Vitals  Encounter Vitals Group     BP 07/05/23 0820 (!) 137/92     Systolic BP Percentile --      Diastolic BP Percentile --      Pulse Rate 07/05/23 0820 93     Resp 07/05/23 0820 18     Temp 07/05/23 0820 98.3 F (36.8  C)     Temp Source 07/05/23 0820 Oral     SpO2 07/05/23 0820 98 %     Weight --      Height --      Head Circumference --      Peak Flow --      Pain Score 07/05/23 0821 7     Pain Loc --      Pain Education --      Exclude from Growth Chart --    No data found.  Updated Vital Signs BP (!) 137/92 (BP Location: Right Arm)   Pulse 93   Temp 98.3 F (36.8 C) (Oral)   Resp 18   LMP 04/30/2021 Comment: Negative HCG on 05/09/2021  SpO2 98%   Visual Acuity Right Eye Distance:   Left Eye Distance:   Bilateral Distance:    Right Eye Near:   Left Eye Near:    Bilateral Near:     Physical Exam Vitals and nursing note reviewed.  Constitutional:      General: She is not in acute distress.    Appearance: Normal appearance.  HENT:     Head: Normocephalic.     Right Ear: Tympanic membrane, ear canal and external ear normal.     Left Ear: Tympanic membrane, ear canal and external ear normal.     Nose: Nose normal.     Right Turbinates: Enlarged and swollen.     Left Turbinates: Enlarged and swollen.     Right Sinus: No maxillary sinus tenderness or frontal sinus tenderness.     Left Sinus: No maxillary sinus tenderness or frontal sinus tenderness.     Mouth/Throat:     Lips: Pink.     Mouth: Mucous membranes are moist.     Pharynx: Uvula midline. Pharyngeal swelling, oropharyngeal exudate (Right  tonsil), posterior oropharyngeal erythema and uvula swelling present.     Tonsils: 2+ on the right. 2+ on the left.  Eyes:     Extraocular Movements: Extraocular movements intact.     Pupils: Pupils are equal, round, and reactive to light.  Cardiovascular:     Rate and Rhythm: Normal rate and regular rhythm.     Pulses: Normal pulses.     Heart sounds: Normal heart sounds.  Pulmonary:     Effort: Pulmonary effort is normal. No respiratory distress.     Breath sounds: Normal breath sounds. No stridor. No wheezing, rhonchi or rales.  Abdominal:     General: Bowel sounds are normal.     Palpations: Abdomen is soft.     Tenderness: There is no abdominal tenderness.  Musculoskeletal:     Cervical back: Normal range of motion.  Skin:    General: Skin is warm and dry.  Neurological:     General: No focal deficit present.     Mental Status: She is alert and oriented to person, place, and time.  Psychiatric:        Mood and Affect: Mood normal.        Behavior: Behavior normal.      UC Treatments / Results  Labs (all labs ordered are listed, but only abnormal results are displayed) Labs Reviewed  POCT RAPID STREP A (OFFICE) - Normal  POCT MONO SCREEN ([facility]) - Normal  POC COVID19/FLU A&B COMBO    EKG   Radiology No results found.  Procedures Procedures (including critical care time)  Medications Ordered in UC Medications - No data to display  Initial Impression / Assessment and Plan /  UC Course  I have reviewed the triage vital signs and the nursing notes.  Pertinent labs & imaging results that were available during my care of the patient were reviewed by me and considered in my medical decision making (see chart for details).  The rapid strep test, COVID/flu test, and monotest were all negative.  A throat culture is pending.  Patient with +2 tonsil swelling with exudate noted on exam.  Will treat empirically for acute tonsillitis with Augmentin  875/2025 mg twice daily  for the next 7 days while throat culture is pending.  Viscous lidocaine  2% was also prescribed for patient's throat pain.  Supportive care recommendations were provided and discussed with the patient to include fluids, rest, warm salt water  gargles, over-the-counter analgesics, and continuing Chloraseptic throat spray.  Discussed indications with patient regarding follow-up.  Patient was in agreement with this plan of care and verbalizes understanding.  All questions were answered.  Patient stable for discharge.  Work note was provided.  Final Clinical Impressions(s) / UC Diagnoses   Final diagnoses:  None   Discharge Instructions   None    ED Prescriptions   None    PDMP not reviewed this encounter.   Hardy Lia, NP 07/05/23 [id number]

## 2023-07-05 NOTE — ED Triage Notes (Signed)
 Pt reports sore throat and fever, pain when swallowing, body aches, joint pain, both mono and strep have been going around her job.

## 2023-07-05 NOTE — Discharge Instructions (Signed)
 The rapid strep test, COVID/flu test, and monotest were all negative.  A throat culture is pending.  You will be contacted if the pending test results are negative, advising you to stop the antibiotic prescribed today. Take medication as prescribed. Increase fluids and allow for plenty of rest. Continue over-the-counter Tylenol  or ibuprofen  as needed for pain, fever, or general discomfort. Warm salt water  gargles 3-4 times daily as needed for throat pain or discomfort. May use over-the-counter Chloraseptic throat spray or throat lozenges as needed for throat pain or discomfort. Recommend a soft diet to include soup, broth, yogurt, pudding, or Jell-O while symptoms persist. If symptoms fail to improve with this treatment, recommend following up with your primary care physician for further evaluation. Follow-up as needed.

## 2023-07-06 ENCOUNTER — Other Ambulatory Visit: Payer: Self-pay | Admitting: Family Medicine

## 2023-07-06 DIAGNOSIS — I10 Essential (primary) hypertension: Secondary | ICD-10-CM

## 2023-07-06 LAB — TSH: TSH: 1.73 u[IU]/mL (ref 0.450–4.500)

## 2023-07-06 LAB — T4, FREE: Free T4: 1.13 ng/dL (ref 0.82–1.77)

## 2023-07-07 ENCOUNTER — Encounter: Payer: Self-pay | Admitting: Family Medicine

## 2023-07-07 ENCOUNTER — Encounter: Payer: Self-pay | Admitting: Nurse Practitioner

## 2023-07-07 ENCOUNTER — Ambulatory Visit (INDEPENDENT_AMBULATORY_CARE_PROVIDER_SITE_OTHER): Payer: Commercial Managed Care - PPO | Admitting: Nurse Practitioner

## 2023-07-07 VITALS — BP 118/82 | HR 58 | Ht 65.0 in | Wt 248.6 lb

## 2023-07-07 DIAGNOSIS — E890 Postprocedural hypothyroidism: Secondary | ICD-10-CM | POA: Diagnosis not present

## 2023-07-07 DIAGNOSIS — E039 Hypothyroidism, unspecified: Secondary | ICD-10-CM

## 2023-07-07 LAB — CULTURE, GROUP A STREP (THRC)

## 2023-07-07 MED ORDER — LEVOTHYROXINE SODIUM 175 MCG PO TABS: 175.0000 ug | ORAL_TABLET | Freq: Every day | ORAL | 1 refills | Status: DC

## 2023-07-07 NOTE — Patient Instructions (Signed)

## 2023-07-07 NOTE — Progress Notes (Signed)
 07/07/2023     Endocrinology Follow Up Note    Subjective:    Patient ID: Paula Myers, female    DOB: 11/17/1985, PCP Cook, Jayce G, DO.   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   Depression    Dysrhythmia    "skipping" HR at beginning of pregnancy   Encounter for annual general medical examination with abnormal findings in adult 12/18/2021   Encounter for general adult medical examination with abnormal findings 10/07/2020   H/O cervical polypectomy    Heartburn during pregnancy    History of cesarean section 08/22/2014   Hypertension 2005   while in labor   Hyperthyroidism    Iron  deficiency anemia due to chronic blood loss 11/21/2020   Kidney stone    Need for immunization against influenza 12/18/2021   Palpitation    Screening due 10/07/2020   SVT (supraventricular tachycardia) (HCC)    SVT (supraventricular tachycardia) (HCC)    Tachycardia    Wound, open, nose 10/07/2020    Past Surgical History:  Procedure Laterality Date   ABDOMINAL HYSTERECTOMY N/A 05/13/2021   Procedure: HYSTERECTOMY ABDOMINAL;  Surgeon: Keene Pastures, DO;  Location: [facility];  Service: Gynecology;  Laterality: N/A;   CESAREAN SECTION     06/11/09; 05/25/2003   CESAREAN SECTION WITH BILATERAL TUBAL LIGATION Bilateral 08/22/2014   Procedure: CESAREAN SECTION WITH BILATERAL TUBAL LIGATION;  Surgeon: Artemisa Bile, MD;  Location: [facility];  Service: Obstetrics;  Laterality: Bilateral;   CYSTOSCOPY/URETEROSCOPY/HOLMIUM LASER/STENT PLACEMENT Bilateral 04/14/2023   Procedure: CYSTOSCOPY/BILATERAL RETROGRADE PYELOGRAM/URETEROSCOPY/HOLMIUM LASER/STENT PLACEMENT;  Surgeon: Adelbert Homans, MD;  [facility];  Service: Urology;  Laterality: Bilateral;   DILATION AND CURETTAGE OF UTERUS  01/2008   ELBOW SURGERY     Left    Social History   Socioeconomic History   Marital status:  Married    Spouse name: Not on file   Number of children: 3   Years of education: Not on file   Highest education level: Associate degree: occupational, Scientist, product/process development, or vocational program  Occupational History   Occupation: [employer]    Comment: Dispatcher  Tobacco Use   Smoking status: Every Day    Types: E-cigarettes   Smokeless tobacco: Never   Tobacco comments:    Quit cigarettes in February 2024. Vape cartridge currently lasts 1 month at a time  Vaping Use   Vaping status: Every Day  Substance and Sexual Activity   Alcohol use: Not Currently    Comment: maybe 1-2 per year   Drug use: Never   Sexual activity: Yes    Birth control/protection: Surgical    Comment: hyst  Other Topics Concern   Not on file  Social History Narrative   Not on file   Social Drivers of Health   Financial Resource Strain: Low Risk  (02/08/2023)   Overall Financial Resource Strain (CARDIA)    Difficulty of Paying Living Expenses: Not very hard  Food Insecurity: No Food Insecurity (02/08/2023)   Hunger Vital Sign    Worried About Running Out of Food in the Last Year: Never true    Ran Out of Food in the Last Year: Never true  Transportation Needs: No Transportation Needs (02/08/2023)   PRAPARE - Administrator, Civil Service (Medical): No    Lack of Transportation (Non-Medical): No  Physical Activity: Insufficiently Active (02/08/2023)  Exercise Vital Sign    Days of Exercise per Week: 3 days    Minutes of Exercise per Session: 30 min  Stress: Stress Concern Present (02/08/2023)   [employer] of Occupational Health - Occupational Stress Questionnaire    Feeling of Stress : To some extent  Social Connections: Moderately Integrated (02/08/2023)   Social Connection and Isolation Panel [NHANES]    Frequency of Communication with Friends and Family: Three times a week    Frequency of Social Gatherings with Friends and Family: Once a week    Attends Religious Services: 1  to 4 times per year    Active Member of [employer] or Organizations: No    Attends Engineer, structural: Not on file    Marital Status: Married    Family History  Problem Relation Age of Onset   Cancer Paternal Grandfather        thyroid    Cancer Paternal Grandmother        ovarian   Diabetes Maternal Grandmother    Hypertension Maternal Grandmother    Heart disease Maternal Grandmother    Hypertension Mother    Diabetes Mother    Diabetes Brother     Outpatient Encounter Medications as of 07/07/2023  Medication Sig   acetaminophen  (TYLENOL ) 325 MG tablet Take 2 tablets (650 mg total) by mouth every 6 (six) hours as needed for moderate pain or mild pain.   amLODipine  (NORVASC ) 5 MG tablet TAKE 1 TABLET(5 MG) BY MOUTH DAILY   amoxicillin -clavulanate (AUGMENTIN ) 875-125 MG tablet Take 1 tablet by mouth every 12 (twelve) hours.   benzoyl peroxide -erythromycin  (BENZAMYCIN) gel Apply topically 2 (two) times daily.   hydrochlorothiazide  (HYDRODIURIL ) 12.5 MG tablet TAKE 1 TABLET(12.5 MG) BY MOUTH DAILY   hydrOXYzine  (VISTARIL ) 25 MG capsule Take 1 capsule (25 mg total) by mouth every 8 (eight) hours as needed for anxiety.   ibuprofen  (ADVIL ) 800 MG tablet Take 1 tablet (800 mg total) by mouth 3 (three) times daily.   lamoTRIgine  (LAMICTAL ) 100 MG tablet TAKE 1 TABLET(100 MG) BY MOUTH DAILY   lidocaine  (XYLOCAINE ) 2 % solution Gargle and spit 5 mL every 6 hours as needed for throat pain or discomfort.   tirzepatide (ZEPBOUND) 2.5 MG/0.5ML injection vial Inject 2.5 mg into the skin once a week.   tretinoin  (RETIN-A ) 0.025 % cream Apply topically at bedtime.   Vitamin D , Ergocalciferol , (DRISDOL ) 1.25 MG (50000 UNIT) CAPS capsule Take 1 capsule (50,000 Units total) by mouth every 7 (seven) days.   [DISCONTINUED] levothyroxine  (SYNTHROID ) 175 MCG tablet TAKE 1 TABLET(175 MCG) BY MOUTH DAILY BEFORE BREAKFAST   levothyroxine  (SYNTHROID ) 175 MCG tablet Take 1 tablet (175 mcg total) by mouth  daily before breakfast.   tirzepatide 5 MG/0.5ML injection vial Inject 5 mg into the skin once a week. Start after 1 month on 2.5 mg dose. (Patient not taking: Reported on 07/07/2023)   tirzepatide 7.5 MG/0.5ML injection vial Inject 7.5 mg into the skin once a week. Start after 1 month of 5 mg dose. (Patient not taking: Reported on 07/07/2023)   ZEPBOUND 2.5 MG/0.5ML Pen Inject 2.5 mg into the skin once a week. (Patient not taking: Reported on 07/07/2023)   No facility-administered encounter medications on file as of 07/07/2023.    ALLERGIES: Allergies  Allergen Reactions   Levaquin [Levofloxacin In D5w] Anaphylaxis   Levofloxacin Anaphylaxis   Pertussis Vaccines Swelling    T-Dap injection caused localized swelling of arm. Has had Tetanus before without problems.   Megace  [Megestrol ]  Other (See Comments)    Cause Depression   Tetanus-Diphth-Acell Pertussis Swelling   Venofer  [Iron  Sucrose] Other (See Comments)    Chest Pain    VACCINATION STATUS: Immunization History  Administered Date(s) Administered   DTaP 09/09/1983, 11/18/1983, 02/03/1984, 01/25/1985, 10/29/1988   Hepatitis B 12/09/1994, 01/14/1995, 06/16/1995   IPV 09/09/1983, 11/18/1983, 02/03/1984, 01/25/1985   Influenza,inj,Quad PF,6+ Mos 11/07/2020, 12/18/2021   Influenza-Unspecified 12/18/2002, 01/23/2014   MMR 10/28/1984, 12/26/2004   Moderna Sars-Covid-2 Vaccination 12/19/2019, 01/16/2020   Tdap 12/26/2004     HPI  Paula Myers is 39 y.o. female who presents today with a medical history as above. she is being seen in follow up after being seen in consultation for hyperthyroidism requested by Myrna Ast, DO.   she denies dysphagia, choking, shortness of breath, no recent voice change.    she does family history of thyroid  dysfunction in her grandmother (Graves disease) and mother (MNG requiring thyroidectomy), but denies family hx of thyroid  cancer. she denies personal history of goiter. she is not on any  anti-thyroid  medications nor on any thyroid  hormone supplements. She does intermittently use of Biotin containing supplements but has not taken it in a few weeks.   She underwent RAI ablation for Graves disease on 11/15/20 and has since been started on thyroid  hormone replacement.  Review of systems  Constitutional: + Minimally fluctuating body weight,  current Body mass index is 41.37 kg/m. , no fatigue, no subjective hyperthermia, no subjective hypothermia Eyes: no blurry vision, no xerophthalmia ENT: + sore throat- recently saw UC for this- pending throat culture, no nodules palpated in throat, no dysphagia/odynophagia, no hoarseness Cardiovascular: no chest pain, no shortness of breath, no palpitations, no leg swelling Respiratory: no cough, no shortness of breath Gastrointestinal: no nausea/vomiting/diarrhea Musculoskeletal: no muscle/joint aches Skin: no rashes, no hyperemia Neurological: no tremors, no numbness, no tingling, no dizziness Psychiatric: no depression, no anxiety   Objective:    BP 118/82 (BP Location: Left Arm, Patient Position: Sitting, Cuff Size: Large)   Pulse (!) 58   Ht 5\' 5"  (1.651 m)   Wt 248 lb 9.6 oz (112.8 kg)   LMP 04/30/2021 Comment: Negative HCG on 05/09/2021  BMI 41.37 kg/m   Wt Readings from Last 3 Encounters:  07/07/23 248 lb 9.6 oz (112.8 kg)  06/10/23 250 lb 3.2 oz (113.5 kg)  04/14/23 251 lb 6.4 oz (114 kg)     BP Readings from Last 3 Encounters:  07/07/23 118/82  07/05/23 (!) 137/92  06/10/23 117/75                        Physical Exam- Limited  Constitutional:  Body mass index is 41.37 kg/m. , not in acute distress, normal state of mind Eyes:  EOMI, no exophthalmos Musculoskeletal: no gross deformities, strength intact in all four extremities, no gross restriction of joint movements Skin:  no rashes, no hyperemia Neurological: no tremor with outstretched hands   CMP     Component Value Date/Time   NA 139 03/18/2023 0818    K 4.9 03/18/2023 0818   CL 102 03/18/2023 0818   CO2 23 03/18/2023 0818   GLUCOSE 91 03/18/2023 0818   GLUCOSE 94 11/18/2022 0941   BUN 9 03/18/2023 0818   CREATININE 0.90 03/18/2023 0818   CALCIUM 9.5 03/18/2023 0818   PROT 7.3 03/18/2023 0818   ALBUMIN 4.6 03/18/2023 0818   AST 23 03/18/2023 0818   ALT 34 (H) 03/18/2023 0818  ALKPHOS 78 03/18/2023 0818   BILITOT 0.5 03/18/2023 0818   GFRNONAA >60 11/18/2022 0941   GFRAA >60 09/22/2019 1938     CBC    Component Value Date/Time   WBC 8.8 03/18/2023 0818   WBC 9.0 11/18/2022 0941   RBC 5.10 03/18/2023 0818   RBC 5.30 (H) 11/18/2022 0941   HGB 14.6 03/18/2023 0818   HCT 45.5 03/18/2023 0818   PLT 311 03/18/2023 0818   MCV 89 03/18/2023 0818   MCH 28.6 03/18/2023 0818   MCH 28.5 11/18/2022 0941   MCHC 32.1 03/18/2023 0818   MCHC 32.8 11/18/2022 0941   RDW 12.6 03/18/2023 0818   LYMPHSABS 1.9 03/18/2023 0818   MONOABS 0.5 11/18/2022 0941   EOSABS 0.2 03/18/2023 0818   BASOSABS 0.1 03/18/2023 0818     Diabetic Labs (most recent): Lab Results  Component Value Date   HGBA1C 5.5 03/18/2023   HGBA1C 5.2 11/06/2022   HGBA1C 5.3 05/13/2022    Lipid Panel     Component Value Date/Time   CHOL 194 03/18/2023 0818   TRIG 121 03/18/2023 0818   HDL 48 03/18/2023 0818   CHOLHDL 4.0 03/18/2023 0818   LDLCALC 124 (H) 03/18/2023 0818   LABVLDL 22 03/18/2023 0818     Lab Results  Component Value Date   TSH 1.730 07/05/2023   TSH 2.560 03/18/2023   TSH 2.370 03/18/2023   TSH 0.037 (L) 11/06/2022   TSH 0.025 (L) 08/05/2022   TSH 8.650 (H) 05/13/2022   TSH 6.940 (H) 03/19/2022   TSH 3.980 12/01/2021   TSH 4.890 (H) 08/05/2021   TSH 18.700 (H) 05/29/2021   FREET4 1.13 07/05/2023   FREET4 1.38 03/18/2023   FREET4 1.36 03/18/2023   FREET4 1.69 11/06/2022   FREET4 2.40 (H) 08/05/2022   FREET4 1.37 05/13/2022   FREET4 1.37 03/19/2022   FREET4 1.63 12/01/2021   FREET4 1.30 08/05/2021   FREET4 0.88 05/29/2021      Uptake and Scan from 10/17/20 CLINICAL DATA:  Hyperthyroidism, TSH 0.450   EXAM: THYROID  SCAN AND UPTAKE - 4 AND 24 HOURS   TECHNIQUE: Following oral administration of I-123 capsule, anterior planar imaging was acquired at 24 hours. Thyroid  uptake was calculated with a thyroid  probe at 4-6 hours and 24 hours.   RADIOPHARMACEUTICALS:  436 uCi I-123 sodium iodide p.o.   COMPARISON:  None   FINDINGS: Homogeneous tracer distribution in both thyroid  lobes.   No focal areas of increased or decreased tracer localization.   4 hour I-123 uptake = 14.1% (normal 5-20%)   24 hour I-123 uptake = 31.1% (normal 10-30%)   IMPRESSION: Normal thyroid  scan.   Minimally elevated 24 hour radio iodine  uptake.   In the setting of hyperthyroidism, findings suggest Graves disease     Electronically Signed   By: Wynelle Heather M.D.   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  -She underwent RAI ablation on 11/15/20.    Her previsit TFTs are consistent with appropriate hormone replacement.  She is advised to continue Levothyroxine  175 mcg po daily before breakfast.  She is aware that if she loses great amount of weight with use of Zepbound, that her thyroid  hormone dosage may need to be decreased in the future as well.   - The correct intake of thyroid  hormone (Levothyroxine , Synthroid ), is on empty stomach first thing in the morning, with water , separated by at least 30 minutes from breakfast and other medications,  and separated by more  than 4 hours from calcium, iron , multivitamins, acid reflux medications (PPIs).  - This medication is a life-long medication and will be needed to correct thyroid  hormone imbalances for the rest of your life.  The dose may change from time to time, based on thyroid  blood work.  - It is extremely important to be consistent taking this medication, near the same time each morning.  -AVOID TAKING PRODUCTS CONTAINING BIOTIN (commonly found in Hair, Skin, Nails vitamins) AS IT INTERFERES WITH THE VALIDITY OF THYROID  FUNCTION BLOOD TESTS.          -Patient is advised to maintain close follow up with Cook, Jayce G, DO for primary care needs.   I spent  28  minutes in the care of the patient today including review of labs from Thyroid  Function, CMP, and other relevant labs ; imaging/biopsy records (current and previous including abstractions from other facilities); face-to-face time discussing  her lab results and symptoms, medications doses, her options of short and long term treatment based on the latest standards of care / guidelines;   and documenting the encounter.  Davey Erp Manfre  participated in the discussions, expressed understanding, and voiced agreement with the above plans.  All questions were answered to her satisfaction. she is encouraged to contact clinic should she have any questions or concerns prior to her return visit.    Follow up plan: Return in about 4 months (around 11/07/2023) for Thyroid  follow up, Previsit labs.   Thank you for involving me in the care of this pleasant patient, and I will continue to update you with her progress.   Hulon Magic, [facility] [address] [place], [place] [address] Phone: [phone] Fax: [phone]  07/07/2023, 8:48 AM

## 2023-07-13 ENCOUNTER — Encounter (HOSPITAL_COMMUNITY): Payer: Self-pay | Admitting: Psychiatry

## 2023-07-13 ENCOUNTER — Telehealth (HOSPITAL_COMMUNITY): Admitting: Psychiatry

## 2023-07-13 DIAGNOSIS — F4310 Post-traumatic stress disorder, unspecified: Secondary | ICD-10-CM | POA: Diagnosis not present

## 2023-07-13 DIAGNOSIS — F331 Major depressive disorder, recurrent, moderate: Secondary | ICD-10-CM | POA: Diagnosis not present

## 2023-07-13 DIAGNOSIS — Z720 Tobacco use: Secondary | ICD-10-CM

## 2023-07-13 DIAGNOSIS — E559 Vitamin D deficiency, unspecified: Secondary | ICD-10-CM

## 2023-07-13 DIAGNOSIS — F410 Panic disorder [episodic paroxysmal anxiety] without agoraphobia: Secondary | ICD-10-CM | POA: Diagnosis not present

## 2023-07-13 DIAGNOSIS — F411 Generalized anxiety disorder: Secondary | ICD-10-CM | POA: Diagnosis not present

## 2023-07-13 DIAGNOSIS — G4726 Circadian rhythm sleep disorder, shift work type: Secondary | ICD-10-CM

## 2023-07-13 MED ORDER — LAMOTRIGINE 100 MG PO TABS: 100.0000 mg | ORAL_TABLET | Freq: Every day | ORAL | 4 refills | Status: AC

## 2023-07-13 MED ORDER — HYDROXYZINE HCL 10 MG PO TABS
10.0000 mg | ORAL_TABLET | Freq: Two times a day (BID) | ORAL | 2 refills | Status: AC | PRN
Start: 2023-07-13 — End: ?

## 2023-07-13 NOTE — Patient Instructions (Addendum)
 We decreased the hydroxyzine  to 10 mg twice daily as needed for anxiety/panic which should help with some of the drowsy sensation.  You can look into this website to get free nicotine cessation resources mailed to your home just be sure to take off the nicotine patches before bedtime: [email or web address]
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
## 2023-07-18 ENCOUNTER — Ambulatory Visit
Admission: EM | Admit: 2023-07-18 | Discharge: 2023-07-18 | Disposition: A | Discharge status: Home / Self Care | Attending: Family Medicine | Admitting: Family Medicine

## 2023-07-18 DIAGNOSIS — J029 Acute pharyngitis, unspecified: Secondary | ICD-10-CM | POA: Insufficient documentation

## 2023-07-18 LAB — POCT RAPID STREP A (OFFICE): Rapid Strep A Screen: NEGATIVE

## 2023-07-18 NOTE — ED Triage Notes (Signed)
 Pt reports she has left side throat pain, headache, and left side ear pain x 4 days

## 2023-07-18 NOTE — ED Provider Notes (Signed)
 [facility]    CSN: [id number] Arrival date & time: 07/18/23  1244      History   Chief Complaint No chief complaint on file.   HPI Paula Myers is a 38 y.o. female.   Patient presenting today with about 4-day history of sore throat, headache.  Denies congestion, cough, fever, chills, body aches, chest pain, shortness of breath, abdominal pain, vomiting, diarrhea.  So far not trying anything over-the-counter for symptoms.  Recently treated for strep throat and concerned that she has it again.    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   Depression    Dysrhythmia    "skipping" HR at beginning of pregnancy   Encounter for annual general medical examination with abnormal findings in adult 12/18/2021   Encounter for general adult medical examination with abnormal findings 10/07/2020   H/O cervical polypectomy    Heartburn during pregnancy    History of cesarean section 08/22/2014   Hypertension 2005   while in labor   Hyperthyroidism    Iron  deficiency anemia due to chronic blood loss 11/21/2020   Kidney stone    Need for immunization against influenza 12/18/2021   Palpitation    Screening due 10/07/2020   SVT (supraventricular tachycardia) (HCC)    SVT (supraventricular tachycardia) (HCC)    Tachycardia    Wound, open, nose 10/07/2020    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   PTSD (post-traumatic stress disorder) 09/15/2022   Vitamin D  deficiency 09/15/2022   Hypothyroidism 05/11/2022   Generalized anxiety disorder with panic attacks 05/11/2022   Morbid obesity (HCC) 06/17/2021   Vapes nicotine containing substance with history of cigarette smoking 06/17/2021   Hypertension 04/10/2021   Iron  deficiency  anemia due to chronic blood loss 11/21/2020   Cervical high risk HPV (human papillomavirus) test positive 07/31/2016   Major depressive disorder, recurrent episode, moderate (HCC) 10/02/2015    Past Surgical History:  Procedure Laterality Date   ABDOMINAL HYSTERECTOMY N/A 05/13/2021   Procedure: HYSTERECTOMY ABDOMINAL;  Surgeon: Ozan, Jennifer, DO;  Location: [facility];  Service: Gynecology;  Laterality: N/A;   CESAREAN SECTION     06/11/09; 05/25/2003   CESAREAN SECTION WITH BILATERAL TUBAL LIGATION Bilateral 08/22/2014   Procedure: CESAREAN SECTION WITH BILATERAL TUBAL LIGATION;  Surgeon: Artemisa Bile, MD;  Location: [facility];  Service: Obstetrics;  Laterality: Bilateral;   CYSTOSCOPY/URETEROSCOPY/HOLMIUM LASER/STENT PLACEMENT Bilateral 04/14/2023   Procedure: CYSTOSCOPY/BILATERAL RETROGRADE PYELOGRAM/URETEROSCOPY/HOLMIUM LASER/STENT PLACEMENT;  Surgeon: Adelbert Homans, MD;  Location: [facility];  Service: Urology;  Laterality: Bilateral;   DILATION AND CURETTAGE OF UTERUS  01/2008   ELBOW SURGERY     Left    OB History     Gravida  4   Para  3   Term  3   Preterm      AB  1   Living  3      SAB  1   IAB      Ectopic      Multiple  0   Live Births  3            Home Medications    Prior to Admission medications   Medication Sig Start Date End Date Taking? Authorizing Provider  acetaminophen  (TYLENOL ) 325 MG tablet Take 2 tablets (650 mg total) by mouth every 6 (six) hours as needed for moderate pain or mild pain. 05/13/21   Ozan, Jennifer, DO  amLODipine  (NORVASC ) 5 MG tablet TAKE 1 TABLET(5 MG) BY MOUTH DAILY 05/07/23   Zarwolo, Gloria, FNP  benzoyl peroxide -erythromycin  (BENZAMYCIN) gel Apply topically 2 (two) times daily. 09/09/22   Zarwolo, Gloria, FNP  hydrochlorothiazide  (HYDRODIURIL ) 12.5 MG tablet TAKE 1 TABLET(12.5 MG) BY MOUTH DAILY 04/06/23   Zarwolo, Gloria, FNP  hydrOXYzine  (ATARAX ) 10 MG tablet Take 1 tablet (10 mg total) by mouth  2 (two) times daily as needed for anxiety (panic). 07/13/23   Madie Schilling, MD  ibuprofen  (ADVIL ) 800 MG tablet Take 1 tablet (800 mg total) by mouth 3 (three) times daily. 12/30/22   Leath-Warren, Belen Bowers, NP  lamoTRIgine  (LAMICTAL ) 100 MG tablet Take 1 tablet (100 mg total) by mouth daily. 07/13/23   Madie Schilling, MD  levothyroxine  (SYNTHROID ) 175 MCG tablet Take 1 tablet (175 mcg total) by mouth daily before breakfast. 07/07/23   Wendel Hals, NP  lidocaine  (XYLOCAINE ) 2 % solution Gargle and spit 5 mL every 6 hours as needed for throat pain or discomfort. 07/05/23   Leath-Warren, Belen Bowers, NP  tirzepatide (ZEPBOUND) 2.5 MG/0.5ML injection vial Inject 2.5 mg into the skin once a week. 06/10/23   Cook, Jayce G, DO  tirzepatide 5 MG/0.5ML injection vial Inject 5 mg into the skin once a week. Start after 1 month on 2.5 mg dose. Patient not taking: Reported on 07/07/2023 06/10/23   Cook, Jayce G, DO  tirzepatide 7.5 MG/0.5ML injection vial Inject 7.5 mg into the skin once a week. Start after 1 month of 5 mg dose. Patient not taking: Reported on 07/07/2023 06/10/23   Cook, Jayce G, DO  tretinoin  (RETIN-A ) 0.025 % cream Apply topically at bedtime. 09/09/22   Zarwolo, Gloria, FNP  Vitamin D , Ergocalciferol , (DRISDOL ) 1.25 MG (50000 UNIT) CAPS capsule Take 1 capsule (50,000 Units total) by mouth every 7 (seven) days. 03/19/23   Zarwolo, Gloria, FNP  ZEPBOUND 2.5 MG/0.5ML Pen Inject 2.5 mg into the skin once a week. Patient not taking: Reported on 07/07/2023 06/16/23   [provider]    Family History Family History  Problem Relation Age of Onset   Cancer Paternal Grandfather        thyroid    Cancer Paternal Grandmother        ovarian   Diabetes Maternal Grandmother    Hypertension Maternal Grandmother    Heart disease Maternal Grandmother    Hypertension Mother    Diabetes Mother    Diabetes Brother     Social History Social History   Tobacco Use   Smoking status: Every Day     Types: E-cigarettes   Smokeless tobacco: Never   Tobacco comments:    Quit cigarettes in February 2024. Vape cartridge currently lasts 1 month at a time  Vaping Use   Vaping status: Every Day  Substance Use Topics   Alcohol use: Not Currently    Comment: maybe 1-2 per year   Drug use: Never     Allergies   Levaquin [levofloxacin in d5w], Levofloxacin, Pertussis vaccines, Megace  [megestrol ], Tetanus-diphth-acell pertussis, and Venofer  [iron  sucrose]   Review of Systems Review of Systems PER HPI  Physical Exam Triage Vital Signs ED Triage Vitals  Encounter Vitals Group     BP 07/18/23 1317 120/83     Systolic BP Percentile --  Diastolic BP Percentile --      Pulse Rate 07/18/23 1317 79     Resp 07/18/23 1317 16     Temp 07/18/23 1317 98 F (36.7 C)     Temp Source 07/18/23 1317 Oral     SpO2 07/18/23 1317 96 %     Weight --      Height --      Head Circumference --      Peak Flow --      Pain Score 07/18/23 1316 3     Pain Loc --      Pain Education --      Exclude from Growth Chart --    No data found.  Updated Vital Signs BP 120/83 (BP Location: Right Arm)   Pulse 79   Temp 98 F (36.7 C) (Oral)   Resp 16   LMP 04/30/2021 Comment: Negative HCG on 05/09/2021  SpO2 96%   Visual Acuity Right Eye Distance:   Left Eye Distance:   Bilateral Distance:    Right Eye Near:   Left Eye Near:    Bilateral Near:     Physical Exam Vitals and nursing note reviewed.  Constitutional:      Appearance: Normal appearance. She is not ill-appearing.  HENT:     Head: Atraumatic.     Nose: Nose normal.     Mouth/Throat:     Mouth: Mucous membranes are moist.     Pharynx: Oropharynx is clear. Posterior oropharyngeal erythema present. No oropharyngeal exudate.  Eyes:     Extraocular Movements: Extraocular movements intact.     Conjunctiva/sclera: Conjunctivae normal.  Cardiovascular:     Rate and Rhythm: Normal rate and regular rhythm.     Heart sounds:  Normal heart sounds.  Pulmonary:     Effort: Pulmonary effort is normal.     Breath sounds: Normal breath sounds.  Musculoskeletal:        General: Normal range of motion.     Cervical back: Normal range of motion and neck supple.  Lymphadenopathy:     Cervical: No cervical adenopathy.  Skin:    General: Skin is warm and dry.  Neurological:     Mental Status: She is alert and oriented to person, place, and time.  Psychiatric:        Mood and Affect: Mood normal.        Thought Content: Thought content normal.        Judgment: Judgment normal.      UC Treatments / Results  Labs (all labs ordered are listed, but only abnormal results are displayed) Labs Reviewed  POCT RAPID STREP A (OFFICE) - Normal  CULTURE, GROUP A STREP [facility])    EKG   Radiology No results found.  Procedures Procedures (including critical care time)  Medications Ordered in UC Medications - No data to display  Initial Impression / Assessment and Plan / UC Course  I have reviewed the triage vital signs and the nursing notes.  Pertinent labs & imaging results that were available during my care of the patient were reviewed by me and considered in my medical decision making (see chart for details).     Vital signs and exam very reassuring today, rapid strep negative, throat culture pending.  Suspect viral versus allergic pharyngitis.  Discussed supportive over-the-counter medications, home care and return precautions.  Final Clinical Impressions(s) / UC Diagnoses   Final diagnoses:  Acute pharyngitis, unspecified etiology   Discharge Instructions   None  ED Prescriptions   None    PDMP not reviewed this encounter.   Corbin Dess, [place] 07/18/23 [phone]

## 2023-07-21 ENCOUNTER — Ambulatory Visit: Payer: Self-pay

## 2023-07-21 LAB — CULTURE, GROUP A STREP (THRC)

## 2023-08-06 ENCOUNTER — Other Ambulatory Visit: Payer: Self-pay | Admitting: Family Medicine

## 2023-08-09 ENCOUNTER — Other Ambulatory Visit: Payer: Self-pay | Admitting: Nurse Practitioner

## 2023-08-09 ENCOUNTER — Other Ambulatory Visit: Payer: Self-pay | Admitting: Family Medicine

## 2023-08-09 DIAGNOSIS — I10 Essential (primary) hypertension: Secondary | ICD-10-CM

## 2023-08-09 DIAGNOSIS — E890 Postprocedural hypothyroidism: Secondary | ICD-10-CM

## 2023-09-10 ENCOUNTER — Ambulatory Visit: Admitting: Family Medicine

## 2023-09-14 ENCOUNTER — Telehealth: Payer: Self-pay | Admitting: Pharmacy Technician

## 2023-09-14 ENCOUNTER — Encounter (HOSPITAL_COMMUNITY): Payer: Self-pay | Admitting: Hematology

## 2023-09-14 ENCOUNTER — Other Ambulatory Visit: Payer: Self-pay | Admitting: Family Medicine

## 2023-09-14 ENCOUNTER — Other Ambulatory Visit (HOSPITAL_COMMUNITY): Payer: Self-pay

## 2023-09-14 NOTE — Telephone Encounter (Signed)
 Pharmacy Patient Advocate Encounter   Received notification from CoverMyMeds that prior authorization for Zepbound  7.5MG /0.5ML pen-injectors is required/requested.   Insurance verification completed.   The patient is insured through [employer] .   Per test claim: PA required; PA submitted to above mentioned insurance via [employer] Key/confirmation #/EOC [id number] Status is pending

## 2023-09-15 ENCOUNTER — Ambulatory Visit: Admitting: Family Medicine

## 2023-09-15 DIAGNOSIS — R918 Other nonspecific abnormal finding of lung field: Secondary | ICD-10-CM

## 2023-09-15 DIAGNOSIS — I10 Essential (primary) hypertension: Secondary | ICD-10-CM | POA: Diagnosis not present

## 2023-09-15 DIAGNOSIS — F410 Panic disorder [episodic paroxysmal anxiety] without agoraphobia: Secondary | ICD-10-CM

## 2023-09-15 DIAGNOSIS — F411 Generalized anxiety disorder: Secondary | ICD-10-CM

## 2023-09-15 DIAGNOSIS — R911 Solitary pulmonary nodule: Secondary | ICD-10-CM

## 2023-09-15 DIAGNOSIS — Z6841 Body Mass Index (BMI) 40.0 and over, adult: Secondary | ICD-10-CM

## 2023-09-15 MED ORDER — TIRZEPATIDE-WEIGHT MANAGEMENT 10 MG/0.5ML ~~LOC~~ SOLN: 10.0000 mg | SUBCUTANEOUS | 1 refills | Status: DC

## 2023-09-15 MED ORDER — AMLODIPINE BESYLATE 5 MG PO TABS: 5.0000 mg | ORAL_TABLET | Freq: Every day | ORAL | 3 refills | Status: AC

## 2023-09-15 NOTE — Patient Instructions (Signed)
 Medication as prescribed.  Continue counseling.  Consider Imipramine (its an old medication). Send me a message with your thoughts.  Follow up in 6 months.

## 2023-09-15 NOTE — Assessment & Plan Note (Addendum)
 Worsening/exacerbation.  Discussed additional treatment options.  Advised patient to consider imipramine and let me know if she would like to proceed.  Continue counseling.

## 2023-09-15 NOTE — Assessment & Plan Note (Signed)
 Increasing Zepbound .

## 2023-09-15 NOTE — Assessment & Plan Note (Signed)
 BP stable.  Needs refill on amlodipine .  Amlodipine  was refilled today.

## 2023-09-15 NOTE — Assessment & Plan Note (Signed)
 Noncontrast CT for reevaluation.

## 2023-09-15 NOTE — Progress Notes (Signed)
 Subjective:  Patient ID: Paula Myers, female    DOB: 1986/01/23  Age: 38 y.o. MRN: [id number]  CC:   Chief Complaint  Patient presents with   Obesity    C/o indigestion worse at night - has been on 7.5 mg zepbound  1 month - would like to increase dose   Anxiety    Daily and worse    Lung Lesion    Repeat CT scan    HPI:  38 year old female presents for follow-up.  Patient interested in increasing dose of Ceftin.  She is overall tolerating but does have some indigestion particular with dietary indiscretion.  Patient reports worsening anxiety.  She attributes this to stress of her job.  She has been seen by psychiatry in the past.  Psychiatrist left the practice recently.  She has tried multiple agents including SSRIs and SNRIs as well as Trintellix .  She is also tried BuSpar , Atarax .  Will discuss today.  Additionally, patient needs follow-up CT scan regarding pulmonary nodule as recommended by radiology from prior CT scan.    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   PTSD (post-traumatic stress disorder) 09/15/2022   Vitamin D  deficiency 09/15/2022   Hypothyroidism 05/11/2022   Generalized anxiety disorder with panic attacks 05/11/2022   Morbid obesity (HCC) 06/17/2021   Vapes nicotine containing substance with history of cigarette smoking 06/17/2021   Hypertension 04/10/2021   Iron  deficiency anemia due to chronic blood loss 11/21/2020   Cervical high risk HPV (human papillomavirus) test positive 07/31/2016   Major depressive disorder, recurrent episode, moderate (HCC) 10/02/2015    Social Hx   Social History   Socioeconomic History   Marital status: Married    Spouse name: Not on file   Number of children: 3   Years of education: Not on file   Highest education level: Associate degree:  occupational, Scientist, product/process development, or vocational program  Occupational History   Occupation: [employer]    Comment: Dispatcher  Tobacco Use   Smoking status: Every Day    Types: E-cigarettes   Smokeless tobacco: Never   Tobacco comments:    Quit cigarettes in February 2024. Vape cartridge currently lasts 1 month at a time  Vaping Use   Vaping status: Every Day  Substance and Sexual Activity   Alcohol use: Not Currently    Comment: maybe 1-2 per year   Drug use: Never   Sexual activity: Yes    Birth control/protection: Surgical    Comment: hyst  Other Topics Concern   Not on file  Social History Narrative   Not on file   Social Drivers of Health   Financial Resource Strain: Low Risk  (09/14/2023)   Overall Financial Resource Strain (CARDIA)    Difficulty of Paying Living Expenses: Not very hard  Food Insecurity: No Food Insecurity (09/14/2023)   Hunger Vital Sign    Worried About Running Out of Food in the Last Year: Never true    Ran Out of Food in the Last Year: Never true  Transportation Needs: No Transportation Needs (09/14/2023)   PRAPARE - Administrator, Civil Service (Medical): No    Lack of Transportation (Non-Medical): No  Physical Activity: Insufficiently Active (09/14/2023)   Exercise Vital Sign    Days of Exercise per Week: 3 days    Minutes of  Exercise per Session: 20 min  Stress: Stress Concern Present (09/14/2023)   [employer] of Occupational Health - Occupational Stress Questionnaire    Feeling of Stress: Very much  Social Connections: Moderately Integrated (09/14/2023)   Social Connection and Isolation Panel    Frequency of Communication with Friends and Family: Three times a week    Frequency of Social Gatherings with Friends and Family: Once a week    Attends Religious Services: 1 to 4 times per year    Active Member of Clubs or Organizations: No    Attends Engineer, structural: Not on file    Marital Status: Married     Review of Systems Per HPI  Objective:  BP 124/78   Pulse (!) 117   Temp 97.7 F (36.5 C)   Ht 5' 5 (1.651 m)   Wt 241 lb 12.8 oz (109.7 kg)   LMP 04/30/2021 Comment: Negative HCG on 05/09/2021  SpO2 98%   BMI 40.24 kg/m      09/15/2023    8:18 AM 07/18/2023    1:17 PM 07/07/2023    8:29 AM  BP/Weight  Systolic BP 124 120 118  Diastolic BP 78 83 82  Wt. (Lbs) 241.8  248.6  BMI 40.24 kg/m2  41.37 kg/m2    Physical Exam Vitals and nursing note reviewed.  Constitutional:      General: She is not in acute distress.    Appearance: Normal appearance. She is obese.  HENT:     Head: Normocephalic and atraumatic.  Cardiovascular:     Rate and Rhythm: Normal rate and regular rhythm.  Pulmonary:     Effort: Pulmonary effort is normal.     Breath sounds: Normal breath sounds.  Neurological:     Mental Status: She is alert.  Psychiatric:     Comments: Flat affect.     Lab Results  Component Value Date   WBC 8.8 03/18/2023   HGB 14.6 03/18/2023   HCT 45.5 03/18/2023   PLT 311 03/18/2023   GLUCOSE 91 03/18/2023   CHOL 194 03/18/2023   TRIG 121 03/18/2023   HDL 48 03/18/2023   LDLCALC 124 (H) 03/18/2023   ALT 34 (H) 03/18/2023   AST 23 03/18/2023   NA 139 03/18/2023   K 4.9 03/18/2023   CL 102 03/18/2023   CREATININE 0.90 03/18/2023   BUN 9 03/18/2023   CO2 23 03/18/2023   TSH 1.730 07/05/2023   INR 1.1 10/27/2020   HGBA1C 5.5 03/18/2023     Assessment & Plan:  Primary hypertension Assessment & Plan: BP stable.  Needs refill on amlodipine .  Amlodipine  was refilled today.  Orders: -     amLODIPine  Besylate; Take 1 tablet (5 mg total) by mouth daily.  Dispense: 90 tablet; Refill: 3  Abnormal findings on diagnostic imaging of lung -     CT CHEST WO CONTRAST  Pulmonary nodule Assessment & Plan: Noncontrast CT for reevaluation.  Orders: -     CT CHEST WO CONTRAST  Generalized anxiety disorder with panic attacks Assessment &  Plan: Worsening/exacerbation.  Discussed additional treatment options.  Advised patient to consider imipramine and let me know if she would like to proceed.  Continue counseling.   Morbid obesity (HCC) Assessment & Plan: Increasing Zepbound .  Orders: -     Tirzepatide -Weight Management; Inject 10 mg into the skin once a week.  Dispense: 6 mL; Refill: 1    Follow-up:  6 months  Dominico Rod Bluford DO [facility]

## 2023-09-21 ENCOUNTER — Other Ambulatory Visit (HOSPITAL_COMMUNITY): Payer: Self-pay

## 2023-09-22 ENCOUNTER — Other Ambulatory Visit (HOSPITAL_COMMUNITY): Payer: Self-pay

## 2023-09-22 NOTE — Telephone Encounter (Signed)
 Pharmacy Patient Advocate Encounter  Received notification from [employer] that Prior Authorization for Zepbound  7.5MG /0.5ML pen-injectors  has been APPROVED from 08/15/2023 to 05/19/2024. Ran test claim, Copay is $24.99. This test claim was processed through [facility]- copay amounts may vary at other pharmacies due to pharmacy/plan contracts, or as the patient moves through the different stages of their insurance plan.   PA #/Case ID/Reference #: [id number]

## 2023-09-24 ENCOUNTER — Ambulatory Visit (HOSPITAL_COMMUNITY)
Admission: RE | Admit: 2023-09-24 | Discharge: 2023-09-24 | Disposition: A | Discharge status: Home / Self Care | Source: Ambulatory Visit | Attending: Family Medicine | Admitting: Family Medicine

## 2023-09-24 DIAGNOSIS — R918 Other nonspecific abnormal finding of lung field: Secondary | ICD-10-CM | POA: Insufficient documentation

## 2023-09-24 DIAGNOSIS — R911 Solitary pulmonary nodule: Secondary | ICD-10-CM | POA: Insufficient documentation

## 2023-10-02 ENCOUNTER — Ambulatory Visit: Payer: Self-pay | Admitting: Family Medicine

## 2023-10-14 ENCOUNTER — Other Ambulatory Visit: Payer: Self-pay

## 2023-10-18 ENCOUNTER — Encounter: Payer: Self-pay | Admitting: Family Medicine

## 2023-10-19 ENCOUNTER — Other Ambulatory Visit: Payer: Self-pay | Admitting: Family Medicine

## 2023-10-19 MED ORDER — TIRZEPATIDE-WEIGHT MANAGEMENT 12.5 MG/0.5ML ~~LOC~~ SOAJ: 12.5000 mg | SUBCUTANEOUS | 0 refills | Status: DC

## 2023-10-19 MED ORDER — IMIPRAMINE HCL 50 MG PO TABS: 50.0000 mg | ORAL_TABLET | Freq: Every day | ORAL | 1 refills | Status: DC

## 2023-11-06 LAB — T4, FREE: Free T4: 1.99 ng/dL — ABNORMAL HIGH (ref 0.82–1.77)

## 2023-11-06 LAB — TSH: TSH: 0.341 u[IU]/mL — ABNORMAL LOW (ref 0.450–4.500)

## 2023-11-10 NOTE — Patient Instructions (Signed)

## 2023-11-11 ENCOUNTER — Encounter: Payer: Self-pay | Admitting: Nurse Practitioner

## 2023-11-11 ENCOUNTER — Ambulatory Visit (INDEPENDENT_AMBULATORY_CARE_PROVIDER_SITE_OTHER): Admitting: Nurse Practitioner

## 2023-11-11 VITALS — BP 110/78 | HR 93 | Ht 65.0 in | Wt 230.2 lb

## 2023-11-11 DIAGNOSIS — E890 Postprocedural hypothyroidism: Secondary | ICD-10-CM

## 2023-11-11 MED ORDER — LEVOTHYROXINE SODIUM 150 MCG PO TABS: 150.0000 ug | ORAL_TABLET | Freq: Every day | ORAL | 1 refills | Status: DC

## 2023-11-11 NOTE — Progress Notes (Signed)
 11/11/2023     Endocrinology Follow Up Note    Subjective:    Patient ID: Paula Myers, female    DOB: 1985/03/13, PCP Cook, Jayce G, DO.   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   Depression    Dysrhythmia    skipping HR at beginning of pregnancy   Encounter for annual general medical examination with abnormal findings in adult 12/18/2021   Encounter for general adult medical examination with abnormal findings 10/07/2020   H/O cervical polypectomy    Heartburn during pregnancy    History of cesarean section 08/22/2014   Hypertension 2005   while in labor   Hyperthyroidism    Iron  deficiency anemia due to chronic blood loss 11/21/2020   Kidney stone    Need for immunization against influenza 12/18/2021   Palpitation    Screening due 10/07/2020   SVT (supraventricular tachycardia) (HCC)    SVT (supraventricular tachycardia) (HCC)    Tachycardia    Wound, open, nose 10/07/2020    Past Surgical History:  Procedure Laterality Date   ABDOMINAL HYSTERECTOMY N/A 05/13/2021   Procedure: HYSTERECTOMY ABDOMINAL;  Surgeon: Marilynn Nest, DO;  Location: [facility];  Service: Gynecology;  Laterality: N/A;   CESAREAN SECTION     06/11/09; 05/25/2003   CESAREAN SECTION WITH BILATERAL TUBAL LIGATION Bilateral 08/22/2014   Procedure: CESAREAN SECTION WITH BILATERAL TUBAL LIGATION;  Surgeon: Gigi Botts, MD;  Location: [facility];  Service: Obstetrics;  Laterality: Bilateral;   CYSTOSCOPY/URETEROSCOPY/HOLMIUM LASER/STENT PLACEMENT Bilateral 04/14/2023   Procedure: CYSTOSCOPY/BILATERAL RETROGRADE PYELOGRAM/URETEROSCOPY/HOLMIUM LASER/STENT PLACEMENT;  Surgeon: Devere Lonni Righter, MD;  Location: [facility];  Service: Urology;  Laterality: Bilateral;   DILATION AND CURETTAGE OF UTERUS  01/2008   ELBOW SURGERY     Left    Social History   Socioeconomic History   Marital status:  Married    Spouse name: Not on file   Number of children: 3   Years of education: Not on file   Highest education level: Associate degree: occupational, Scientist, product/process development, or vocational program  Occupational History   Occupation: [employer]    Comment: Dispatcher  Tobacco Use   Smoking status: Every Day    Types: E-cigarettes   Smokeless tobacco: Never   Tobacco comments:    Quit cigarettes in February 2024. Vape cartridge currently lasts 1 month at a time  Vaping Use   Vaping status: Every Day  Substance and Sexual Activity   Alcohol use: Not Currently    Comment: maybe 1-2 per year   Drug use: Never   Sexual activity: Yes    Birth control/protection: Surgical    Comment: hyst  Other Topics Concern   Not on file  Social History Narrative   Not on file   Social Drivers of Health   Financial Resource Strain: Low Risk  (09/14/2023)   Overall Financial Resource Strain (CARDIA)    Difficulty of Paying Living Expenses: Not very hard  Food Insecurity: No Food Insecurity (09/14/2023)   Hunger Vital Sign    Worried About Running Out of Food in the Last Year: Never true    Ran Out of Food in the Last Year: Never true  Transportation Needs: No Transportation Needs (09/14/2023)   PRAPARE - Administrator, Civil Service (Medical): No    Lack of Transportation (Non-Medical): No  Physical Activity: Insufficiently Active (09/14/2023)  Exercise Vital Sign    Days of Exercise per Week: 3 days    Minutes of Exercise per Session: 20 min  Stress: Stress Concern Present (09/14/2023)   [employer] of Occupational Health - Occupational Stress Questionnaire    Feeling of Stress: Very much  Social Connections: Moderately Integrated (09/14/2023)   Social Connection and Isolation Panel    Frequency of Communication with Friends and Family: Three times a week    Frequency of Social Gatherings with Friends and Family: Once a week    Attends Religious Services: 1 to 4 times per  year    Active Member of [employer] or Organizations: No    Attends Engineer, structural: Not on file    Marital Status: Married    Family History  Problem Relation Age of Onset   Cancer Paternal Grandfather        thyroid    Cancer Paternal Grandmother        ovarian   Diabetes Maternal Grandmother    Hypertension Maternal Grandmother    Heart disease Maternal Grandmother    Hypertension Mother    Diabetes Mother    Diabetes Brother     Outpatient Encounter Medications as of 11/11/2023  Medication Sig   acetaminophen  (TYLENOL ) 325 MG tablet Take 2 tablets (650 mg total) by mouth every 6 (six) hours as needed for moderate pain or mild pain.   amLODipine  (NORVASC ) 5 MG tablet Take 1 tablet (5 mg total) by mouth daily.   hydrochlorothiazide  (HYDRODIURIL ) 12.5 MG tablet TAKE 1 TABLET(12.5 MG) BY MOUTH DAILY   hydrOXYzine  (ATARAX ) 10 MG tablet Take 1 tablet (10 mg total) by mouth 2 (two) times daily as needed for anxiety (panic).   ibuprofen  (ADVIL ) 800 MG tablet Take 1 tablet (800 mg total) by mouth 3 (three) times daily.   imipramine  (TOFRANIL ) 50 MG tablet Take 1 tablet (50 mg total) by mouth at bedtime.   lamoTRIgine  (LAMICTAL ) 100 MG tablet Take 1 tablet (100 mg total) by mouth daily.   tirzepatide  (ZEPBOUND ) 12.5 MG/0.5ML Pen Inject 12.5 mg into the skin once a week.   tretinoin  (RETIN-A ) 0.025 % cream Apply topically at bedtime.   Vitamin D , Ergocalciferol , (DRISDOL ) 1.25 MG (50000 UNIT) CAPS capsule Take 1 capsule (50,000 Units total) by mouth every 7 (seven) days.   [DISCONTINUED] levothyroxine  (SYNTHROID ) 175 MCG tablet TAKE 1 TABLET(175 MCG) BY MOUTH DAILY BEFORE BREAKFAST   levothyroxine  (SYNTHROID ) 150 MCG tablet Take 1 tablet (150 mcg total) by mouth daily before breakfast.   No facility-administered encounter medications on file as of 11/11/2023.    ALLERGIES: Allergies  Allergen Reactions   Levaquin [Levofloxacin In D5w] Anaphylaxis   Levofloxacin Anaphylaxis    Pertussis Vaccines Swelling    T-Dap injection caused localized swelling of arm. Has had Tetanus before without problems.   Megace  [Megestrol ] Other (See Comments)    Cause Depression   Tetanus-Diphth-Acell Pertussis Swelling   Venofer  [Iron  Sucrose] Other (See Comments)    Chest Pain    VACCINATION STATUS: Immunization History  Administered Date(s) Administered   DTaP 09/09/1983, 11/18/1983, 02/03/1984, 01/25/1985, 10/29/1988   Hepatitis B 12/09/1994, 01/14/1995, 06/16/1995   IPV 09/09/1983, 11/18/1983, 02/03/1984, 01/25/1985   Influenza,inj,Quad PF,6+ Mos 11/07/2020, 12/18/2021   Influenza-Unspecified 12/18/2002, 01/23/2014   MMR 10/28/1984, 12/26/2004   Moderna Sars-Covid-2 Vaccination 12/19/2019, 01/16/2020   Tdap 12/26/2004     HPI  Paula Myers is 38 y.o. female who presents today with a medical history as above. she is being seen  in follow up after being seen in consultation for hyperthyroidism requested by Cook, Jayce G, DO.   she denies dysphagia, choking, shortness of breath, no recent voice change.    she does family history of thyroid  dysfunction in her grandmother (Graves disease) and mother (MNG requiring thyroidectomy), but denies family hx of thyroid  cancer. she denies personal history of goiter. she is not on any anti-thyroid  medications nor on any thyroid  hormone supplements. She does intermittently use of Biotin containing supplements but has not taken it in a few weeks.   She underwent RAI ablation for Graves disease on 11/15/20 and has since been started on thyroid  hormone replacement.  Review of systems  Constitutional: + decreasing body weight,  current Body mass index is 38.31 kg/m. , no fatigue, no subjective hyperthermia, no subjective hypothermia Eyes: no blurry vision, no xerophthalmia ENT: no sore throat, no nodules palpated in throat, no dysphagia/odynophagia, no hoarseness Cardiovascular: no chest pain, no shortness of breath, +  palpitations, no leg swelling Respiratory: no cough, no shortness of breath Gastrointestinal: no vomiting or diarrhea, + nausea and constipation (from Zepbound ) Musculoskeletal: no muscle/joint aches Skin: no rashes, no hyperemia, + draining wound to suprapubic area (hx of surgery at this point for hysterectomy about 2 years ago) Neurological: no tremors, no numbness, no tingling, no dizziness Psychiatric: no depression, + anxiety   Objective:    BP 110/78 (BP Location: Left Arm, Patient Position: Sitting, Cuff Size: Large)   Pulse 93   Ht 5' 5 (1.651 m)   Wt 230 lb 3.2 oz (104.4 kg)   LMP 04/30/2021 Comment: Negative HCG on 05/09/2021  BMI 38.31 kg/m   Wt Readings from Last 3 Encounters:  11/11/23 230 lb 3.2 oz (104.4 kg)  09/15/23 241 lb 12.8 oz (109.7 kg)  07/07/23 248 lb 9.6 oz (112.8 kg)     BP Readings from Last 3 Encounters:  11/11/23 110/78  09/15/23 124/78  07/18/23 120/83                        Physical Exam- Limited  Constitutional:  Body mass index is 38.31 kg/m. , not in acute distress, normal state of mind Eyes:  EOMI, no exophthalmos Musculoskeletal: no gross deformities, strength intact in all four extremities, no gross restriction of joint movements Skin:  open/draining wound to suprapubic area (site of previous hysterectomy) Neurological: no tremor with outstretched hands   CMP     Component Value Date/Time   NA 139 03/18/2023 0818   K 4.9 03/18/2023 0818   CL 102 03/18/2023 0818   CO2 23 03/18/2023 0818   GLUCOSE 91 03/18/2023 0818   GLUCOSE 94 11/18/2022 0941   BUN 9 03/18/2023 0818   CREATININE 0.90 03/18/2023 0818   CALCIUM 9.5 03/18/2023 0818   PROT 7.3 03/18/2023 0818   ALBUMIN 4.6 03/18/2023 0818   AST 23 03/18/2023 0818   ALT 34 (H) 03/18/2023 0818   ALKPHOS 78 03/18/2023 0818   BILITOT 0.5 03/18/2023 0818   GFRNONAA >60 11/18/2022 0941   GFRAA >60 09/22/2019 1938     CBC    Component Value Date/Time   WBC 8.8 03/18/2023  0818   WBC 9.0 11/18/2022 0941   RBC 5.10 03/18/2023 0818   RBC 5.30 (H) 11/18/2022 0941   HGB 14.6 03/18/2023 0818   HCT 45.5 03/18/2023 0818   PLT 311 03/18/2023 0818   MCV 89 03/18/2023 0818   MCH 28.6 03/18/2023 0818   MCH 28.5 11/18/2022  0941   MCHC 32.1 03/18/2023 0818   MCHC 32.8 11/18/2022 0941   RDW 12.6 03/18/2023 0818   LYMPHSABS 1.9 03/18/2023 0818   MONOABS 0.5 11/18/2022 0941   EOSABS 0.2 03/18/2023 0818   BASOSABS 0.1 03/18/2023 0818     Diabetic Labs (most recent): Lab Results  Component Value Date   HGBA1C 5.5 03/18/2023   HGBA1C 5.2 11/06/2022   HGBA1C 5.3 05/13/2022    Lipid Panel     Component Value Date/Time   CHOL 194 03/18/2023 0818   TRIG 121 03/18/2023 0818   HDL 48 03/18/2023 0818   CHOLHDL 4.0 03/18/2023 0818   LDLCALC 124 (H) 03/18/2023 0818   LABVLDL 22 03/18/2023 0818     Lab Results  Component Value Date   TSH 0.341 (L) 11/05/2023   TSH 1.730 07/05/2023   TSH 2.560 03/18/2023   TSH 2.370 03/18/2023   TSH 0.037 (L) 11/06/2022   TSH 0.025 (L) 08/05/2022   TSH 8.650 (H) 05/13/2022   TSH 6.940 (H) 03/19/2022   TSH 3.980 12/01/2021   TSH 4.890 (H) 08/05/2021   FREET4 1.99 (H) 11/05/2023   FREET4 1.13 07/05/2023   FREET4 1.38 03/18/2023   FREET4 1.36 03/18/2023   FREET4 1.69 11/06/2022   FREET4 2.40 (H) 08/05/2022   FREET4 1.37 05/13/2022   FREET4 1.37 03/19/2022   FREET4 1.63 12/01/2021   FREET4 1.30 08/05/2021     Uptake and Scan from 10/17/20 CLINICAL DATA:  Hyperthyroidism, TSH 0.450   EXAM: THYROID  SCAN AND UPTAKE - 4 AND 24 HOURS   TECHNIQUE: Following oral administration of I-123 capsule, anterior planar imaging was acquired at 24 hours. Thyroid  uptake was calculated with a thyroid  probe at 4-6 hours and 24 hours.   RADIOPHARMACEUTICALS:  436 uCi I-123 sodium iodide p.o.   COMPARISON:  None   FINDINGS: Homogeneous tracer distribution in both thyroid  lobes.   No focal areas of increased or decreased tracer  localization.   4 hour I-123 uptake = 14.1% (normal 5-20%)   24 hour I-123 uptake = 31.1% (normal 10-30%)   IMPRESSION: Normal thyroid  scan.   Minimally elevated 24 hour radio iodine  uptake.   In the setting of hyperthyroidism, findings suggest Graves disease     Electronically Signed   By: Oneil Kiss M.D.   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  -She underwent RAI ablation on 11/15/20.    Her previsit TFTs are consistent with over-replacement (she does have associated symptoms as well), likely related to weight loss from use of Zepbound .  She is advised to lower Levothyroxine  to 150 mcg po daily before breakfast.     - The correct intake of thyroid  hormone (Levothyroxine , Synthroid ), is on empty stomach first thing in the morning, with water , separated by at least 30 minutes from breakfast and other medications,  and separated by more than 4 hours from calcium, iron , multivitamins, acid reflux medications (PPIs).  - This medication is a life-long medication and will be needed to correct thyroid  hormone imbalances for the rest of your life.  The dose may change from time to time, based on thyroid  blood work.  - It is extremely important to be consistent taking this medication, near the same time each morning.  -AVOID TAKING PRODUCTS CONTAINING BIOTIN (commonly found in Hair, Skin, Nails vitamins) AS IT INTERFERES WITH THE VALIDITY OF THYROID  FUNCTION BLOOD TESTS.          -Patient is advised to maintain close follow up with Cook, Jayce G, DO for primary care needs.  I  advised her to reach out to Dr. Bluford regarding her wound.  Does not appear to be infected.    I spent  31  minutes in the care of the patient today including review of labs from Thyroid  Function, CMP, and other relevant labs ; imaging/biopsy records (current and previous including abstractions from other facilities); face-to-face time discussing  her lab results and symptoms, medications doses, her options of short and long term treatment based on the latest standards of care / guidelines;   and documenting the encounter.  Corean BIRCH Sevillano  participated in the discussions, expressed understanding, and voiced agreement with the above plans.  All questions were answered to her satisfaction. she is encouraged to contact clinic should she have any questions or concerns prior to her return visit.    Follow up plan: Return in about 4 months (around 03/12/2024) for Thyroid  follow up, Previsit labs.   Thank you for involving me in the care of this pleasant patient, and I will continue to update you with her progress.   Benton Rio, [facility] [address] [place], [place] [address] Phone: [phone] Fax: [phone]  11/11/2023, 8:37 AM

## 2023-12-06 ENCOUNTER — Encounter: Payer: Self-pay | Admitting: Family Medicine

## 2023-12-06 ENCOUNTER — Other Ambulatory Visit: Payer: Self-pay | Admitting: Family Medicine

## 2023-12-06 MED ORDER — ZEPBOUND 15 MG/0.5ML ~~LOC~~ SOAJ: 15.0000 mg | SUBCUTANEOUS | 3 refills | Status: AC

## 2024-01-03 ENCOUNTER — Ambulatory Visit
Admission: EM | Admit: 2024-01-03 | Discharge: 2024-01-03 | Disposition: A | Discharge status: Home / Self Care | Attending: Nurse Practitioner | Admitting: Nurse Practitioner

## 2024-01-03 DIAGNOSIS — J029 Acute pharyngitis, unspecified: Secondary | ICD-10-CM | POA: Insufficient documentation

## 2024-01-03 DIAGNOSIS — R509 Fever, unspecified: Secondary | ICD-10-CM | POA: Insufficient documentation

## 2024-01-03 DIAGNOSIS — J069 Acute upper respiratory infection, unspecified: Secondary | ICD-10-CM | POA: Insufficient documentation

## 2024-01-03 LAB — POC COVID19/FLU A&B COMBO
Covid Antigen, POC: NEGATIVE
Influenza A Antigen, POC: NEGATIVE
Influenza B Antigen, POC: NEGATIVE

## 2024-01-03 LAB — POCT RAPID STREP A (OFFICE): Rapid Strep A Screen: NEGATIVE

## 2024-01-03 MED ORDER — LIDOCAINE VISCOUS HCL 2 % MT SOLN: OROMUCOSAL | 0 refills | Status: DC

## 2024-01-03 MED ORDER — PROMETHAZINE-DM 6.25-15 MG/5ML PO SYRP: 5.0000 mL | ORAL_SOLUTION | Freq: Four times a day (QID) | ORAL | 0 refills | Status: DC | PRN

## 2024-01-03 NOTE — ED Provider Notes (Signed)
 [facility]    CSN: [id number] Arrival date & time: 01/03/24  1713      History   Chief Complaint Chief Complaint  Patient presents with   Cough   Sore Throat    HPI Paula Myers is a 38 y.o. female.   The history is provided by the patient.   Patient presents with a 1 day history of sore throat, fever, cough, and headache.  Tmax around 101.5.  Patient denies ear pain, ear drainage, runny nose, wheezing, difficulty breathing, abdominal pain, nausea, vomiting, diarrhea, or rash.  Patient states several of her coworkers are sick, also states that she did go to a Halloween activity where there were several people.  States she has been taking over-the-counter Tylenol  and ibuprofen  for her symptoms.  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   Depression    Dysrhythmia    skipping HR at beginning of pregnancy   Encounter for annual general medical examination with abnormal findings in adult 12/18/2021   Encounter for general adult medical examination with abnormal findings 10/07/2020   H/O cervical polypectomy    Heartburn during pregnancy    History of cesarean section 08/22/2014   Hypertension 2005   while in labor   Hyperthyroidism    Iron  deficiency anemia due to chronic blood loss 11/21/2020   Kidney stone    Need for immunization against influenza 12/18/2021   Palpitation    Screening due 10/07/2020   SVT (supraventricular tachycardia)    SVT (supraventricular tachycardia)    Tachycardia    Wound, open, nose 10/07/2020    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   PTSD (post-traumatic stress disorder) 09/15/2022   Vitamin D  deficiency 09/15/2022   Hypothyroidism 05/11/2022   Generalized anxiety disorder  with panic attacks 05/11/2022   Morbid obesity (HCC) 06/17/2021   Vapes nicotine containing substance with history of cigarette smoking 06/17/2021   Hypertension 04/10/2021   Iron  deficiency anemia due to chronic blood loss 11/21/2020   Cervical high risk HPV (human papillomavirus) test positive 07/31/2016   Major depressive disorder, recurrent episode, moderate (HCC) 10/02/2015    Past Surgical History:  Procedure Laterality Date   ABDOMINAL HYSTERECTOMY N/A 05/13/2021   Procedure: HYSTERECTOMY ABDOMINAL;  Surgeon: Ozan, Jennifer, DO;  Location: [facility];  Service: Gynecology;  Laterality: N/A;   CESAREAN SECTION     06/11/09; 05/25/2003   CESAREAN SECTION WITH BILATERAL TUBAL LIGATION Bilateral 08/22/2014   Procedure: CESAREAN SECTION WITH BILATERAL TUBAL LIGATION;  Surgeon: Gigi Botts, MD;  Location: [facility];  Service: Obstetrics;  Laterality: Bilateral;   CYSTOSCOPY/URETEROSCOPY/HOLMIUM LASER/STENT PLACEMENT Bilateral 04/14/2023   Procedure: CYSTOSCOPY/BILATERAL RETROGRADE PYELOGRAM/URETEROSCOPY/HOLMIUM LASER/STENT PLACEMENT;  Surgeon: Devere Lonni Righter, MD;  Location: [facility];  Service: Urology;  Laterality: Bilateral;   DILATION AND CURETTAGE OF UTERUS  01/2008   ELBOW SURGERY     Left    OB History     Gravida  4   Para  3   Term  3   Preterm      AB  1   Living  3      SAB  1   IAB      Ectopic      Multiple  0   Live Births  3            Home Medications    Prior to Admission medications   Medication Sig Start Date End Date Taking? Authorizing Provider  amLODipine  (NORVASC ) 5 MG tablet Take 1 tablet (5 mg total) by mouth daily. 09/15/23  Yes Cook, Jayce G, DO  hydrochlorothiazide  (HYDRODIURIL ) 12.5 MG tablet TAKE 1 TABLET(12.5 MG) BY MOUTH DAILY 04/06/23  Yes Bacchus, Gloria Z, FNP  imipramine  (TOFRANIL ) 50 MG tablet Take 1 tablet (50 mg total) by mouth at bedtime. 10/19/23  Yes Cook, Jayce G, DO  lamoTRIgine  (LAMICTAL ) 100 MG  tablet Take 1 tablet (100 mg total) by mouth daily. 07/13/23  Yes Barbra Jayson LABOR, MD  levothyroxine  (SYNTHROID ) 150 MCG tablet Take 1 tablet (150 mcg total) by mouth daily before breakfast. 11/11/23  Yes Therisa Benton PARAS, NP  lidocaine  (XYLOCAINE ) 2 % solution Gargle and spit 5 mL every 6 hours as needed for throat pain or discomfort. 01/03/24  Yes Leath-Warren, Etta PARAS, NP  promethazine-dextromethorphan (PROMETHAZINE-DM) 6.25-15 MG/5ML syrup Take 5 mLs by mouth 4 (four) times daily as needed. 01/03/24  Yes Leath-Warren, Etta PARAS, NP  tirzepatide  (ZEPBOUND ) 15 MG/0.5ML Pen Inject 15 mg into the skin once a week. 12/06/23  Yes Cook, Jayce G, DO  Vitamin D , Ergocalciferol , (DRISDOL ) 1.25 MG (50000 UNIT) CAPS capsule Take 1 capsule (50,000 Units total) by mouth every 7 (seven) days. 03/19/23  Yes Bacchus, Meade PEDLAR, FNP  acetaminophen  (TYLENOL ) 325 MG tablet Take 2 tablets (650 mg total) by mouth every 6 (six) hours as needed for moderate pain or mild pain. 05/13/21   Ozan, Jennifer, DO  hydrOXYzine  (ATARAX ) 10 MG tablet Take 1 tablet (10 mg total) by mouth 2 (two) times daily as needed for anxiety (panic). 07/13/23   Barbra Jayson LABOR, MD  ibuprofen  (ADVIL ) 800 MG tablet Take 1 tablet (800 mg total) by mouth 3 (three) times daily. 12/30/22   Leath-Warren, Etta PARAS, NP  tretinoin  (RETIN-A ) 0.025 % cream Apply topically at bedtime. 09/09/22   Bacchus, Meade PEDLAR, FNP    Family History Family History  Problem Relation Age of Onset   Cancer Paternal Grandfather        thyroid    Cancer Paternal Grandmother        ovarian   Diabetes Maternal Grandmother    Hypertension Maternal Grandmother    Heart disease Maternal Grandmother    Hypertension Mother    Diabetes Mother    Diabetes Brother     Social History Social History   Tobacco Use   Smoking status: Every Day    Types: E-cigarettes   Smokeless tobacco: Never   Tobacco comments:    Quit cigarettes in February 2024. Vape cartridge  currently lasts 1 month at a time  Vaping Use   Vaping status: Every Day  Substance Use Topics   Alcohol use: Not Currently    Comment: maybe 1-2 per year   Drug use: Never     Allergies   Levaquin [levofloxacin in d5w], Levofloxacin, Pertussis vaccines, Megace  [megestrol ], Tetanus-diphth-acell pertussis, and Venofer  [iron  sucrose]   Review of Systems Review of Systems Per HPI  Physical Exam Triage Vital Signs ED Triage Vitals  Encounter Vitals Group     BP 01/03/24 1735 131/87     Girls Systolic BP Percentile --      Girls Diastolic BP Percentile --      Boys Systolic BP Percentile --      Boys  Diastolic BP Percentile --      Pulse Rate 01/03/24 1735 89     Resp 01/03/24 1735 16     Temp 01/03/24 1735 98.5 F (36.9 C)     Temp Source 01/03/24 1735 Oral     SpO2 01/03/24 1735 98 %     Weight --      Height --      Head Circumference --      Peak Flow --      Pain Score 01/03/24 1736 5     Pain Loc --      Pain Education --      Exclude from Growth Chart --    No data found.  Updated Vital Signs BP 131/87 (BP Location: Right Arm)   Pulse 89   Temp 98.5 F (36.9 C) (Oral)   Resp 16   LMP 04/30/2021 Comment: Negative HCG on 05/09/2021  SpO2 98%   Visual Acuity Right Eye Distance:   Left Eye Distance:   Bilateral Distance:    Right Eye Near:   Left Eye Near:    Bilateral Near:     Physical Exam Vitals and nursing note reviewed.  Constitutional:      General: She is not in acute distress.    Appearance: Normal appearance. She is well-developed.  HENT:     Head: Normocephalic and atraumatic.     Right Ear: Tympanic membrane, ear canal and external ear normal.     Left Ear: Tympanic membrane, ear canal and external ear normal.     Nose: Rhinorrhea present.     Right Turbinates: Enlarged and swollen.     Left Turbinates: Enlarged and swollen.     Right Sinus: No maxillary sinus tenderness or frontal sinus tenderness.     Left Sinus: No maxillary  sinus tenderness or frontal sinus tenderness.     Mouth/Throat:     Lips: Pink.     Mouth: Mucous membranes are moist.     Pharynx: Uvula midline. Posterior oropharyngeal erythema and postnasal drip present. No pharyngeal swelling, oropharyngeal exudate or uvula swelling.     Comments: Cobblestoning present to posterior oropharynx  Eyes:     Extraocular Movements: Extraocular movements intact.     Conjunctiva/sclera: Conjunctivae normal.     Pupils: Pupils are equal, round, and reactive to light.  Neck:     Thyroid : No thyromegaly.     Trachea: No tracheal deviation.  Cardiovascular:     Rate and Rhythm: Normal rate and regular rhythm.     Pulses: Normal pulses.     Heart sounds: Normal heart sounds.  Pulmonary:     Effort: Pulmonary effort is normal. No respiratory distress.     Breath sounds: Normal breath sounds. No stridor. No wheezing, rhonchi or rales.  Abdominal:     General: Bowel sounds are normal.     Palpations: Abdomen is soft.     Tenderness: There is no abdominal tenderness.  Musculoskeletal:     Cervical back: Normal range of motion and neck supple.  Skin:    General: Skin is warm and dry.  Neurological:     General: No focal deficit present.     Mental Status: She is alert and oriented to person, place, and time.  Psychiatric:        Mood and Affect: Mood normal.        Behavior: Behavior normal.        Thought Content: Thought content normal.  Judgment: Judgment normal.      UC Treatments / Results  Labs (all labs ordered are listed, but only abnormal results are displayed) Labs Reviewed  POC COVID19/FLU A&B COMBO - Normal  POCT RAPID STREP A (OFFICE) - Normal  CULTURE, GROUP A STREP [facility])    EKG   Radiology No results found.  Procedures Procedures (including critical care time)  Medications Ordered in UC Medications - No data to display  Initial Impression / Assessment and Plan / UC Course  I have reviewed the triage vital signs  and the nursing notes.  Pertinent labs & imaging results that were available during my care of the patient were reviewed by me and considered in my medical decision making (see chart for details).  The COVID/flu test and rapid strep test were negative.  A throat culture is pending.  On exam, the patient's lung sounds are clear throughout, room air sats are at 98%.  Her vital signs are currently stable.  Symptoms consistent with viral etiology.  Symptomatic treatment provided with Promethazine DM for the cough, and viscous lidocaine  2% for throat pain or discomfort.  Supportive care recommendations were provided and discussed with the patient to include fluids, rest, continuing over-the-counter analgesics, warm salt water  gargles, and use of a humidifier during sleep.  Discussed indications with patient regarding follow-up.  Patient was in agreement with this plan of care and verbalizes understanding.  All questions were answered.  Patient stable for discharge.  Work note was provided.  Final Clinical Impressions(s) / UC Diagnoses   Final diagnoses:  Fever, unspecified  Sore throat  Viral URI with cough     Discharge Instructions      The rapid strep test and COVID/flu test were negative.  A throat culture is pending.  You will be contacted if the pending test result is abnormal.  You will also have access to the results via MyChart. Take medication as prescribed. Increase fluids and allow for plenty of rest. You may continue Tylenol  or ibuprofen  as needed for pain, fever, or general discomfort. Recommend warm salt water  gargles 3-4 times daily as needed for throat pain or discomfort.  You may also use over-the-counter Chloraseptic throat spray or throat lozenges while symptoms persist. Recommend the use of a humidifier in your bedroom at nighttime during sleep and sleeping elevated on pillows while cough symptoms persist. You should remain home until you have been fever free for 24 hours with  no medication. Symptoms should begin to improve over the next 5 to 7 days.  If your symptoms fail to improve, or begin to worsen, you may follow-up in this clinic or with your primary care physician for further evaluation. Follow-up as needed.     ED Prescriptions     Medication Sig Dispense Auth. Provider   promethazine-dextromethorphan (PROMETHAZINE-DM) 6.25-15 MG/5ML syrup Take 5 mLs by mouth 4 (four) times daily as needed. 118 mL Leath-Warren, Etta PARAS, NP   lidocaine  (XYLOCAINE ) 2 % solution Gargle and spit 5 mL every 6 hours as needed for throat pain or discomfort. 100 mL Leath-Warren, Etta PARAS, NP      PDMP not reviewed this encounter.   Gilmer Etta PARAS, NP 01/03/24 1805

## 2024-01-03 NOTE — ED Triage Notes (Signed)
 Sore throat, cough, headache, fever 101 x 1 day. Taking tylenol  and ibuprofen .

## 2024-01-03 NOTE — Discharge Instructions (Addendum)
 The rapid strep test and COVID/flu test were negative.  A throat culture is pending.  You will be contacted if the pending test result is abnormal.  You will also have access to the results via MyChart. Take medication as prescribed. Increase fluids and allow for plenty of rest. You may continue Tylenol  or ibuprofen  as needed for pain, fever, or general discomfort. Recommend warm salt water  gargles 3-4 times daily as needed for throat pain or discomfort.  You may also use over-the-counter Chloraseptic throat spray or throat lozenges while symptoms persist. Recommend the use of a humidifier in your bedroom at nighttime during sleep and sleeping elevated on pillows while cough symptoms persist. You should remain home until you have been fever free for 24 hours with no medication. Symptoms should begin to improve over the next 5 to 7 days.  If your symptoms fail to improve, or begin to worsen, you may follow-up in this clinic or with your primary care physician for further evaluation. Follow-up as needed.

## 2024-01-06 LAB — CULTURE, GROUP A STREP (THRC)

## 2024-01-07 ENCOUNTER — Ambulatory Visit (HOSPITAL_COMMUNITY): Payer: Self-pay

## 2024-01-29 ENCOUNTER — Other Ambulatory Visit: Payer: Self-pay | Admitting: Family Medicine

## 2024-01-29 DIAGNOSIS — E559 Vitamin D deficiency, unspecified: Secondary | ICD-10-CM

## 2024-03-07 LAB — T4, FREE: Free T4: 1.66 ng/dL (ref 0.82–1.77)

## 2024-03-07 LAB — TSH: TSH: 0.58 u[IU]/mL (ref 0.450–4.500)

## 2024-03-10 ENCOUNTER — Ambulatory Visit
Admission: EM | Admit: 2024-03-10 | Discharge: 2024-03-10 | Disposition: A | Discharge status: Home / Self Care | Attending: Physician Assistant | Admitting: Physician Assistant

## 2024-03-10 ENCOUNTER — Other Ambulatory Visit: Payer: Self-pay

## 2024-03-10 ENCOUNTER — Emergency Department (HOSPITAL_COMMUNITY)
Admission: EM | Admit: 2024-03-10 | Discharge: 2024-03-10 | Disposition: A | Discharge status: Home / Self Care | Attending: Emergency Medicine | Admitting: Emergency Medicine

## 2024-03-10 ENCOUNTER — Encounter (HOSPITAL_COMMUNITY): Payer: Self-pay

## 2024-03-10 ENCOUNTER — Emergency Department (HOSPITAL_COMMUNITY)

## 2024-03-10 DIAGNOSIS — R0789 Other chest pain: Secondary | ICD-10-CM | POA: Insufficient documentation

## 2024-03-10 DIAGNOSIS — Z79899 Other long term (current) drug therapy: Secondary | ICD-10-CM | POA: Diagnosis not present

## 2024-03-10 DIAGNOSIS — R079 Chest pain, unspecified: Secondary | ICD-10-CM

## 2024-03-10 DIAGNOSIS — R519 Headache, unspecified: Secondary | ICD-10-CM | POA: Diagnosis present

## 2024-03-10 DIAGNOSIS — G4489 Other headache syndrome: Secondary | ICD-10-CM | POA: Insufficient documentation

## 2024-03-10 LAB — COMPREHENSIVE METABOLIC PANEL WITH GFR
ALT: 21 U/L (ref 0–44)
AST: 20 U/L (ref 15–41)
Albumin: 4.6 g/dL (ref 3.5–5.0)
Alkaline Phosphatase: 72 U/L (ref 38–126)
Anion gap: 15 (ref 5–15)
BUN: 14 mg/dL (ref 6–20)
CO2: 21 mmol/L — ABNORMAL LOW (ref 22–32)
Calcium: 9.4 mg/dL (ref 8.9–10.3)
Chloride: 107 mmol/L (ref 98–111)
Creatinine, Ser: 0.92 mg/dL (ref 0.44–1.00)
GFR, Estimated: >60 mL/min
Glucose, Bld: 82 mg/dL (ref 70–99)
Potassium: 3.9 mmol/L (ref 3.5–5.1)
Sodium: 143 mmol/L (ref 135–145)
Total Bilirubin: 0.4 mg/dL (ref 0.0–1.2)
Total Protein: 7.9 g/dL (ref 6.5–8.1)

## 2024-03-10 LAB — CBC WITH DIFFERENTIAL/PLATELET
Abs Immature Granulocytes: 0.03 K/uL (ref 0.00–0.07)
Basophils Absolute: 0.1 K/uL (ref 0.0–0.1)
Basophils Relative: 1 %
Eosinophils Absolute: 0.1 K/uL (ref 0.0–0.5)
Eosinophils Relative: 1 %
HCT: 43.4 % (ref 36.0–46.0)
Hemoglobin: 15 g/dL (ref 12.0–15.0)
Immature Granulocytes: 0 %
Lymphocytes Relative: 26 %
Lymphs Abs: 2.5 K/uL (ref 0.7–4.0)
MCH: 29.4 pg (ref 26.0–34.0)
MCHC: 34.6 g/dL (ref 30.0–36.0)
MCV: 84.9 fL (ref 80.0–100.0)
Monocytes Absolute: 0.6 K/uL (ref 0.1–1.0)
Monocytes Relative: 6 %
Neutro Abs: 6.5 K/uL (ref 1.7–7.7)
Neutrophils Relative %: 66 %
Platelets: 303 K/uL (ref 150–400)
RBC: 5.11 MIL/uL (ref 3.87–5.11)
RDW: 12.5 % (ref 11.5–15.5)
WBC: 9.7 K/uL (ref 4.0–10.5)
nRBC: 0 % (ref 0.0–0.2)

## 2024-03-10 LAB — TROPONIN T, HIGH SENSITIVITY
Troponin T High Sensitivity: <15 ng/L (ref 0–19)
Troponin T High Sensitivity: <15 ng/L (ref 0–19)

## 2024-03-10 MED ORDER — HYDROCODONE-ACETAMINOPHEN 5-325 MG PO TABS: ORAL_TABLET | ORAL | 0 refills | Status: DC

## 2024-03-10 MED ORDER — MORPHINE SULFATE (PF) 2 MG/ML IV SOLN
2.0000 mg | Freq: Once | INTRAVENOUS | Status: AC
  Administered 2024-03-10: 2 mg via INTRAVENOUS
  Filled 2024-03-10: qty 1

## 2024-03-10 MED ORDER — DIPHENHYDRAMINE HCL 50 MG/ML IJ SOLN
25.0000 mg | Freq: Once | INTRAMUSCULAR | Status: AC
  Administered 2024-03-10: 25 mg via INTRAVENOUS
  Filled 2024-03-10: qty 1

## 2024-03-10 MED ORDER — KETOROLAC TROMETHAMINE 30 MG/ML IJ SOLN
30.0000 mg | Freq: Once | INTRAMUSCULAR | Status: AC
  Administered 2024-03-10: 30 mg via INTRAVENOUS
  Filled 2024-03-10: qty 1

## 2024-03-10 MED ORDER — METOCLOPRAMIDE HCL 5 MG/ML IJ SOLN
10.0000 mg | Freq: Once | INTRAMUSCULAR | Status: AC
  Administered 2024-03-10: 10 mg via INTRAVENOUS
  Filled 2024-03-10: qty 2

## 2024-03-10 NOTE — ED Triage Notes (Signed)
 Pt sent from UC with left side chest, back and neck pain x 2 days and a headache x 3 days.

## 2024-03-10 NOTE — ED Triage Notes (Signed)
 Pt reports she has left side chest pain, radiating up to left side of nec and making left side jaw hurt x 1 day    Took ibuprofen 

## 2024-03-10 NOTE — ED Notes (Signed)
 Pt husband  is not coming to get pt. Pt will drive herself to the ED.

## 2024-03-10 NOTE — ED Notes (Signed)
 Husband at bedside.

## 2024-03-10 NOTE — ED Provider Notes (Signed)
 " [facility]    CSN: [id number] Arrival date & time: 03/10/24  1749      History   Chief Complaint Chief Complaint  Patient presents with   Chest Pain    HPI Paula Myers is a 39 y.o. female.   Patient presents today with a 12-hour history of left-sided chest pain.  She reports that her symptoms began yesterday while she was at work but she assumed it was nothing to worry about and went home.  She took some ibuprofen  and a Goody powder and her symptoms improved and she was able to sleep through the night.  When she woke up this morning she again had some chest discomfort but this has been worsening prompting evaluation.  She reports that the pain is rated 4/5 on a 0-10 pain scale, localized to her left chest with radiation into her neck and jaw, described as tightness/heaviness, worse with ambulation or activity, improved with rest.  She did have some shortness of breath when up and active but denies any nausea, vomiting.  She is experiencing headache and reports that she just feels poorly.  She denies personal history of CAD but does have a family history of heart disease.  She has a history of hypertension and hyperlipidemia but denies diabetes.  She vapes but is not smoke.    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   Depression    Dysrhythmia    skipping HR at beginning of pregnancy   Encounter for annual general medical examination with abnormal findings in adult 12/18/2021   Encounter for general adult medical examination with abnormal findings 10/07/2020   H/O cervical polypectomy    Heartburn during pregnancy    History of cesarean section 08/22/2014   Hypertension 2005   while in labor   Hyperthyroidism    Iron  deficiency anemia due to chronic blood loss 11/21/2020   Kidney stone    Need for immunization against influenza 12/18/2021   Palpitation    Screening due 10/07/2020    SVT (supraventricular tachycardia)    SVT (supraventricular tachycardia)    Tachycardia    Wound, open, nose 10/07/2020    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   PTSD (post-traumatic stress disorder) 09/15/2022   Vitamin D  deficiency 09/15/2022   Hypothyroidism 05/11/2022   Generalized anxiety disorder with panic attacks 05/11/2022   Morbid obesity (HCC) 06/17/2021   Vapes nicotine containing substance with history of cigarette smoking 06/17/2021   Hypertension 04/10/2021   Iron  deficiency anemia due to chronic blood loss 11/21/2020   Cervical high risk HPV (human papillomavirus) test positive 07/31/2016   Major depressive disorder, recurrent episode, moderate (HCC) 10/02/2015    Past Surgical History:  Procedure Laterality Date   ABDOMINAL HYSTERECTOMY N/A 05/13/2021   Procedure: HYSTERECTOMY ABDOMINAL;  Surgeon: Ozan, Jennifer, DO;  Location: [facility];  Service: Gynecology;  Laterality: N/A;   CESAREAN SECTION     06/11/09; 05/25/2003   CESAREAN SECTION WITH BILATERAL TUBAL LIGATION Bilateral 08/22/2014   Procedure: CESAREAN SECTION WITH BILATERAL TUBAL LIGATION;  Surgeon: Gigi Botts, MD;  Location: [facility];  Service: Obstetrics;  Laterality: Bilateral;   CYSTOSCOPY/URETEROSCOPY/HOLMIUM LASER/STENT PLACEMENT Bilateral 04/14/2023   Procedure: CYSTOSCOPY/BILATERAL RETROGRADE PYELOGRAM/URETEROSCOPY/HOLMIUM LASER/STENT PLACEMENT;  Surgeon:  Devere Lonni Righter, MD;  Location: [facility];  Service: Urology;  Laterality: Bilateral;   DILATION AND CURETTAGE OF UTERUS  01/2008   ELBOW SURGERY     Left    OB History     Gravida  4   Para  3   Term  3   Preterm      AB  1   Living  3      SAB  1   IAB      Ectopic      Multiple  0   Live Births  3            Home  Medications    Prior to Admission medications  Medication Sig Start Date End Date Taking? Authorizing Provider  acetaminophen  (TYLENOL ) 325 MG tablet Take 2 tablets (650 mg total) by mouth every 6 (six) hours as needed for moderate pain or mild pain. 05/13/21   Ozan, Jennifer, DO  amLODipine  (NORVASC ) 5 MG tablet Take 1 tablet (5 mg total) by mouth daily. 09/15/23   Cook, Jayce G, DO  hydrochlorothiazide  (HYDRODIURIL ) 12.5 MG tablet TAKE 1 TABLET(12.5 MG) BY MOUTH DAILY 04/06/23   Bacchus, Meade PEDLAR, FNP  hydrOXYzine  (ATARAX ) 10 MG tablet Take 1 tablet (10 mg total) by mouth 2 (two) times daily as needed for anxiety (panic). 07/13/23   Barbra Jayson LABOR, MD  ibuprofen  (ADVIL ) 800 MG tablet Take 1 tablet (800 mg total) by mouth 3 (three) times daily. 12/30/22   Leath-Warren, Etta PARAS, NP  imipramine  (TOFRANIL ) 50 MG tablet Take 1 tablet (50 mg total) by mouth at bedtime. 10/19/23   Cook, Jayce G, DO  lamoTRIgine  (LAMICTAL ) 100 MG tablet Take 1 tablet (100 mg total) by mouth daily. 07/13/23   Stinson, Samuel A, MD  levothyroxine  (SYNTHROID ) 150 MCG tablet Take 1 tablet (150 mcg total) by mouth daily before breakfast. 11/11/23   Therisa Benton PARAS, NP  lidocaine  (XYLOCAINE ) 2 % solution Gargle and spit 5 mL every 6 hours as needed for throat pain or discomfort. 01/03/24   Leath-Warren, Etta PARAS, NP  promethazine -dextromethorphan (PROMETHAZINE -DM) 6.25-15 MG/5ML syrup Take 5 mLs by mouth 4 (four) times daily as needed. 01/03/24   Leath-Warren, Etta PARAS, NP  tirzepatide  (ZEPBOUND ) 15 MG/0.5ML Pen Inject 15 mg into the skin once a week. 12/06/23   Cook, Jayce G, DO  tretinoin  (RETIN-A ) 0.025 % cream Apply topically at bedtime. 09/09/22   Bacchus, Meade PEDLAR, FNP  Vitamin D , Ergocalciferol , (DRISDOL ) 1.25 MG (50000 UNIT) CAPS capsule Take 1 capsule (50,000 Units total) by mouth every 7 (seven) days. 03/19/23   Bacchus, Meade PEDLAR, FNP    Family History Family History  Problem Relation Age of Onset   Cancer  Paternal Grandfather        thyroid    Cancer Paternal Grandmother        ovarian   Diabetes Maternal Grandmother    Hypertension Maternal Grandmother    Heart disease Maternal Grandmother    Hypertension Mother    Diabetes Mother    Diabetes Brother     Social History Social History[1]   Allergies   Levaquin [levofloxacin in d5w], Levofloxacin, Pertussis vaccines, Megace  [megestrol ], Tetanus-diphth-acell pertussis, and Venofer  [iron  sucrose]   Review of Systems Review of Systems  Constitutional:  Positive for activity change and fatigue. Negative for appetite change and fever.  Respiratory:  Positive for shortness of breath.   Cardiovascular:  Positive for chest pain. Negative for palpitations and leg swelling.  Gastrointestinal:  Negative for diarrhea, nausea and vomiting.  Neurological:  Positive for headaches. Negative for dizziness, weakness and light-headedness.     Physical Exam Triage Vital Signs ED Triage Vitals  Encounter Vitals Group     BP 03/10/24 1755 (!) 134/98     Girls Systolic BP Percentile --      Girls Diastolic BP Percentile --      Boys Systolic BP Percentile --      Boys Diastolic BP Percentile --      Pulse Rate 03/10/24 1755 85     Resp 03/10/24 1755 (!) 22     Temp 03/10/24 1755 98.4 F (36.9 C)     Temp Source 03/10/24 1755 Oral     SpO2 03/10/24 1755 98 %     Weight --      Height --      Head Circumference --      Peak Flow --      Pain Score 03/10/24 1754 4     Pain Loc --      Pain Education --      Exclude from Growth Chart --    No data found.  Updated Vital Signs BP (!) 134/98 (BP Location: Right Arm)   Pulse 85   Temp 98.4 F (36.9 C) (Oral)   Resp (!) 22   LMP 04/30/2021   SpO2 98%   Visual Acuity Right Eye Distance:   Left Eye Distance:   Bilateral Distance:    Right Eye Near:   Left Eye Near:    Bilateral Near:     Physical Exam Vitals reviewed.  Constitutional:      General: She is awake. She is not in  acute distress.    Appearance: Normal appearance. She is well-developed. She is not ill-appearing.     Comments: Very pleasant female appears stated age in no acute distress sitting comfortably in exam room  HENT:     Head: Normocephalic and atraumatic.  Cardiovascular:     Rate and Rhythm: Normal rate and regular rhythm.     Heart sounds: Normal heart sounds, S1 normal and S2 normal. No murmur heard. Pulmonary:     Effort: Pulmonary effort is normal.     Breath sounds: Normal breath sounds. No wheezing, rhonchi or rales.     Comments: Clear to auscultation bilaterally Chest:     Chest wall: No deformity, swelling or tenderness.     Comments: No tenderness to palpation of chest wall.  Pain is not reproducible on exam. Abdominal:     General: Bowel sounds are normal.     Palpations: Abdomen is soft.     Tenderness: There is no abdominal tenderness. There is no right CVA tenderness, left CVA tenderness, guarding or rebound.  Psychiatric:        Behavior: Behavior is cooperative.      UC Treatments / Results  Labs (all labs ordered are listed, but only abnormal results are displayed) Labs Reviewed - No data to display  EKG   Radiology No results found.  Procedures Procedures (including critical care time)  Medications Ordered in UC Medications - No data to display  Initial Impression / Assessment and Plan / UC Course  I have reviewed the triage vital signs and the nursing notes.  Pertinent labs & imaging results that were available during my care of the patient were reviewed by me and considered in my medical decision making (see chart for details).     Patient is  well-appearing, afebrile, nontoxic, nontachycardic.  EKG was obtained that showed normal sinus rhythm with ventricular rate of 81 bpm and without ischemic changes; compared to 08/10/2022 tracing normal sinus rhythm replaces sinus tachycardia otherwise no significant change.  She does report worsening symptoms  with activity with improvement at rest and the distribution of her pain is concerning for cardiac etiology.  She has an INTERCHEST score of 2 indicating she is not low risk.  Discussed that given her risk factors and clinical presentation the safe thing to do would be go to the emergency room.  She is agreeable and will go directly to the ER.  She declined EMS transport and was stable at the time of discharge.  Final Clinical Impressions(s) / UC Diagnoses   Final diagnoses:  Chest pain, unspecified type     Discharge Instructions      Please go to the emergency room for further evaluation and management as we discussed.    ED Prescriptions   None    PDMP not reviewed this encounter.    [1]  Social History Tobacco Use   Smoking status: Every Day    Types: E-cigarettes   Smokeless tobacco: Never   Tobacco comments:    Quit cigarettes in February 2024. Vape cartridge currently lasts 1 month at a time  Vaping Use   Vaping status: Every Day  Substance Use Topics   Alcohol use: Not Currently    Comment: maybe 1-2 per year   Drug use: Never     Sherrell Rocky POUR, PA-C 03/10/24 1832  "

## 2024-03-10 NOTE — Discharge Instructions (Signed)
Please go to the emergency room for further evaluation and management as we discussed. 

## 2024-03-10 NOTE — ED Notes (Signed)
 Per provider, pt is waiting for husband to pick her up. Pt is being discharged to the ED.

## 2024-03-10 NOTE — ED Notes (Signed)
 ED Provider at bedside.

## 2024-03-10 NOTE — ED Notes (Signed)
 Pt has warm blanket And water 

## 2024-03-10 NOTE — ED Notes (Signed)
 Patient is being discharged from the Urgent Care and sent to the Emergency Department via POV . Per provider, patient is in need of higher level of care due to chest pain. Patient is aware and verbalizes understanding of plan of care.  Vitals:   03/10/24 1755  BP: (!) 134/98  Pulse: 85  Resp: (!) 22  Temp: 98.4 F (36.9 C)  SpO2: 98%

## 2024-03-10 NOTE — Discharge Instructions (Addendum)
 Follow-up with your family doctor as planned in a couple weeks.  Return if any problems.  Make an appointment for cardiology in the next few weeks

## 2024-03-12 NOTE — ED Provider Notes (Signed)
 "  EMERGENCY DEPARTMENT AT [facility] Provider Note   CSN: [id number] Arrival date & time: 03/10/24  1831     Patient presents with: Chest Pain and Headache   Paula Myers is a 39 y.o. female.   Patient with some left-sided chest pain and left jaw pain earlier today.  No shortness of breath no sweating  The history is provided by the patient and medical records. No language interpreter was used.  Chest Pain Pain location:  L chest Pain quality: aching   Pain radiates to:  Does not radiate Pain severity:  Mild Onset quality:  Sudden Timing:  Intermittent Progression:  Waxing and waning Chronicity:  New Context: not breathing   Associated symptoms: headache   Associated symptoms: no abdominal pain, no back pain, no cough and no fatigue   Headache Associated symptoms: no abdominal pain, no back pain, no congestion, no cough, no diarrhea, no fatigue, no seizures and no sinus pressure        Prior to Admission medications  Medication Sig Start Date End Date Taking? Authorizing Provider  HYDROcodone -acetaminophen  (NORCO/VICODIN) 5-325 MG tablet Take 1 every 6-8 hours for pain is not relieved by Tylenol  or Motrin  alone 03/10/24  Yes Joycelynn Fritsche, MD  acetaminophen  (TYLENOL ) 325 MG tablet Take 2 tablets (650 mg total) by mouth every 6 (six) hours as needed for moderate pain or mild pain. 05/13/21   Ozan, Jennifer, DO  amLODipine  (NORVASC ) 5 MG tablet Take 1 tablet (5 mg total) by mouth daily. 09/15/23   Cook, Jayce G, DO  hydrochlorothiazide  (HYDRODIURIL ) 12.5 MG tablet TAKE 1 TABLET(12.5 MG) BY MOUTH DAILY 04/06/23   Bacchus, Gloria Z, FNP  hydrOXYzine  (ATARAX ) 10 MG tablet Take 1 tablet (10 mg total) by mouth 2 (two) times daily as needed for anxiety (panic). 07/13/23   Barbra Jayson LABOR, MD  ibuprofen  (ADVIL ) 800 MG tablet Take 1 tablet (800 mg total) by mouth 3 (three) times daily. 12/30/22   Leath-Warren, Etta PARAS, NP  imipramine  (TOFRANIL ) 50 MG tablet  Take 1 tablet (50 mg total) by mouth at bedtime. 10/19/23   Cook, Jayce G, DO  lamoTRIgine  (LAMICTAL ) 100 MG tablet Take 1 tablet (100 mg total) by mouth daily. 07/13/23   Barbra Jayson LABOR, MD  levothyroxine  (SYNTHROID ) 150 MCG tablet Take 1 tablet (150 mcg total) by mouth daily before breakfast. 11/11/23   Therisa Benton PARAS, NP  lidocaine  (XYLOCAINE ) 2 % solution Gargle and spit 5 mL every 6 hours as needed for throat pain or discomfort. 01/03/24   Leath-Warren, Etta PARAS, NP  promethazine -dextromethorphan (PROMETHAZINE -DM) 6.25-15 MG/5ML syrup Take 5 mLs by mouth 4 (four) times daily as needed. 01/03/24   Leath-Warren, Etta PARAS, NP  tirzepatide  (ZEPBOUND ) 15 MG/0.5ML Pen Inject 15 mg into the skin once a week. 12/06/23   Cook, Jayce G, DO  tretinoin  (RETIN-A ) 0.025 % cream Apply topically at bedtime. 09/09/22   Bacchus, Meade PEDLAR, FNP  Vitamin D , Ergocalciferol , (DRISDOL ) 1.25 MG (50000 UNIT) CAPS capsule Take 1 capsule (50,000 Units total) by mouth every 7 (seven) days. 03/19/23   Bacchus, Meade PEDLAR, FNP    Allergies: Levaquin [levofloxacin in d5w], Levofloxacin, Pertussis vaccines, Megace  [megestrol ], Tetanus-diphth-acell pertussis, and Venofer  [iron  sucrose]    Review of Systems  Constitutional:  Negative for appetite change and fatigue.  HENT:  Negative for congestion, ear discharge and sinus pressure.   Eyes:  Negative for discharge.  Respiratory:  Negative for cough.   Cardiovascular:  Positive for chest  pain.  Gastrointestinal:  Negative for abdominal pain and diarrhea.  Genitourinary:  Negative for frequency and hematuria.  Musculoskeletal:  Negative for back pain.  Skin:  Negative for rash.  Neurological:  Positive for headaches. Negative for seizures.  Psychiatric/Behavioral:  Negative for hallucinations.     Updated Vital Signs BP 125/89   Pulse 72   Resp 15   Wt 93 kg   LMP 04/30/2021   SpO2 97%   BMI 34.11 kg/m   Physical Exam Vitals and nursing note reviewed.   Constitutional:      Appearance: She is well-developed.  HENT:     Head: Normocephalic.     Nose: Nose normal.  Eyes:     General: No scleral icterus.    Conjunctiva/sclera: Conjunctivae normal.  Neck:     Thyroid : No thyromegaly.  Cardiovascular:     Rate and Rhythm: Normal rate and regular rhythm.     Heart sounds: No murmur heard.    No friction rub. No gallop.  Pulmonary:     Breath sounds: No stridor. No wheezing or rales.  Chest:     Chest wall: No tenderness.  Abdominal:     General: There is no distension.     Tenderness: There is no abdominal tenderness. There is no rebound.  Musculoskeletal:        General: Normal range of motion.     Cervical back: Neck supple.  Lymphadenopathy:     Cervical: No cervical adenopathy.  Skin:    Findings: No erythema or rash.  Neurological:     Mental Status: She is alert and oriented to person, place, and time.     Motor: No abnormal muscle tone.     Coordination: Coordination normal.  Psychiatric:        Behavior: Behavior normal.     (all labs ordered are listed, but only abnormal results are displayed) Labs Reviewed  COMPREHENSIVE METABOLIC PANEL WITH GFR - Abnormal; Notable for the following components:      Result Value   CO2 21 (*)    All other components within normal limits  CBC WITH DIFFERENTIAL/PLATELET  TROPONIN T, HIGH SENSITIVITY  TROPONIN T, HIGH SENSITIVITY    EKG: EKG Interpretation Date/Time:  Friday March 10 2024 18:47:36 EST Ventricular Rate:  86 PR Interval:  142 QRS Duration:  92 QT Interval:  359 QTC Calculation: 430 R Axis:   69  Text Interpretation: Sinus rhythm Confirmed by Suzette Pac [phone]) on 03/10/2024 10:31:08 PM  Radiology: [facility] Chest Port 1 View Result Date: 03/10/2024 CLINICAL DATA:  Left-sided chest pain and back pain. EXAM: PORTABLE CHEST 1 VIEW COMPARISON:  August 08, 2022 FINDINGS: The heart size and mediastinal contours are within normal limits. Both lungs are clear. The  visualized skeletal structures are unremarkable. IMPRESSION: No active disease. Electronically Signed   By: Suzen Dials M.D.   On: 03/10/2024 19:11     Procedures   Medications Ordered in the ED  morphine  (PF) 2 MG/ML injection 2 mg (2 mg Intravenous Given 03/10/24 1900)  ketorolac  (TORADOL ) 30 MG/ML injection 30 mg (30 mg Intravenous Given 03/10/24 2309)  metoCLOPramide  (REGLAN ) injection 10 mg (10 mg Intravenous Given 03/10/24 2311)  diphenhydrAMINE  (BENADRYL ) injection 25 mg (25 mg Intravenous Given 03/10/24 2312)                                    Medical Decision Making Amount and/or  Complexity of Data Reviewed Labs: ordered. Radiology: ordered.  Risk Prescription drug management.  Patient with a chief of chest pain and normal EKG and troponins.  She is referred to cardiology and will follow back up with her family doctor     Final diagnoses:  Atypical chest pain  Other headache syndrome    ED Discharge Orders          Ordered    Ambulatory referral to Cardiology  Status:  Canceled       Comments: If you have not heard from the Cardiology office within the next 72 hours please call [phone].   03/10/24 2316    HYDROcodone -acetaminophen  (NORCO/VICODIN) 5-325 MG tablet        03/10/24 2324    Ambulatory referral to Cardiology       Comments: If you have not heard from the Cardiology office within the next 72 hours please call [phone].   03/10/24 2324               Suzette Pac, MD 03/12/24 1050  "

## 2024-03-13 NOTE — Patient Instructions (Signed)

## 2024-03-15 ENCOUNTER — Encounter: Payer: Self-pay | Admitting: Nurse Practitioner

## 2024-03-15 ENCOUNTER — Ambulatory Visit: Admitting: Nurse Practitioner

## 2024-03-15 VITALS — BP 108/74 | HR 94 | Ht 65.0 in | Wt 206.8 lb

## 2024-03-15 DIAGNOSIS — E890 Postprocedural hypothyroidism: Secondary | ICD-10-CM

## 2024-03-15 MED ORDER — LEVOTHYROXINE SODIUM 137 MCG PO TABS: 137.0000 ug | ORAL_TABLET | Freq: Every day | ORAL | 1 refills | Status: AC

## 2024-03-15 NOTE — Progress Notes (Signed)
 "         03/15/2024     Endocrinology Follow Up Note    Subjective:    Patient ID: Paula Myers, female    DOB: 04-29-1985, PCP Cook, Jayce G, DO.   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   Depression    Dysrhythmia    skipping HR at beginning of pregnancy   Encounter for annual general medical examination with abnormal findings in adult 12/18/2021   Encounter for general adult medical examination with abnormal findings 10/07/2020   H/O cervical polypectomy    Heartburn during pregnancy    History of cesarean section 08/22/2014   Hypertension 2005   while in labor   Hyperthyroidism    Iron  deficiency anemia due to chronic blood loss 11/21/2020   Kidney stone    Need for immunization against influenza 12/18/2021   Palpitation    Screening due 10/07/2020   SVT (supraventricular tachycardia)    SVT (supraventricular tachycardia)    Tachycardia    Wound, open, nose 10/07/2020    Past Surgical History:  Procedure Laterality Date   ABDOMINAL HYSTERECTOMY N/A 05/13/2021   Procedure: HYSTERECTOMY ABDOMINAL;  Surgeon: Marilynn Nest, DO;  Location: [facility];  Service: Gynecology;  Laterality: N/A;   CESAREAN SECTION     06/11/09; 05/25/2003   CESAREAN SECTION WITH BILATERAL TUBAL LIGATION Bilateral 08/22/2014   Procedure: CESAREAN SECTION WITH BILATERAL TUBAL LIGATION;  Surgeon: Gigi Botts, MD;  Location: [facility];  Service: Obstetrics;  Laterality: Bilateral;   CYSTOSCOPY/URETEROSCOPY/HOLMIUM LASER/STENT PLACEMENT Bilateral 04/14/2023   Procedure: CYSTOSCOPY/BILATERAL RETROGRADE PYELOGRAM/URETEROSCOPY/HOLMIUM LASER/STENT PLACEMENT;  Surgeon: Devere Lonni Righter, MD;  Location: [facility];  Service: Urology;  Laterality: Bilateral;   DILATION AND CURETTAGE OF UTERUS  01/2008   ELBOW SURGERY     Left    Social History   Socioeconomic History   Marital status: Married     Spouse name: Not on file   Number of children: 3   Years of education: Not on file   Highest education level: Associate degree: occupational, scientist, product/process development, or vocational program  Occupational History   Occupation: [employer]    Comment: Dispatcher  Tobacco Use   Smoking status: Every Day    Types: E-cigarettes   Smokeless tobacco: Never   Tobacco comments:    Quit cigarettes in February 2024. Vape cartridge currently lasts 1 month at a time  Vaping Use   Vaping status: Every Day  Substance and Sexual Activity   Alcohol use: Not Currently    Comment: maybe 1-2 per year   Drug use: Never   Sexual activity: Yes    Birth control/protection: Surgical    Comment: hyst  Other Topics Concern   Not on file  Social History Narrative   Not on file   Social Drivers of Health   Tobacco Use: High Risk (03/15/2024)   Patient History    Smoking Tobacco Use: Every Day    Smokeless Tobacco Use: Never    Passive Exposure: Not on file  Financial Resource Strain: Low Risk (09/14/2023)   Overall Financial Resource Strain (CARDIA)    Difficulty of Paying Living Expenses: Not very hard  Food Insecurity: No Food Insecurity (09/14/2023)   Epic    Worried About Radiation Protection Practitioner of Food in the Last Year: Never true    Ran Out of Food in the Last Year: Never true  Transportation Needs: No Transportation Needs (09/14/2023)   Epic    Lack of Transportation (Medical): No    Lack of Transportation (Non-Medical): No  Physical Activity: Insufficiently Active (09/14/2023)   Exercise Vital Sign    Days of Exercise per Week: 3 days    Minutes of Exercise per Session: 20 min  Stress: Stress Concern Present (09/14/2023)   [employer] of Occupational Health - Occupational Stress Questionnaire    Feeling of Stress: Very much  Social Connections: Moderately Integrated (09/14/2023)   Social Connection and Isolation Panel    Frequency of Communication with Friends and Family: Three times a week     Frequency of Social Gatherings with Friends and Family: Once a week    Attends Religious Services: 1 to 4 times per year    Active Member of [employer] or Organizations: No    Attends Engineer, Structural: Not on file    Marital Status: Married  Depression (PHQ2-9): Medium Risk (09/15/2023)   Depression (PHQ2-9)    PHQ-2 Score: 7  Alcohol Screen: Low Risk (09/14/2023)   Alcohol Screen    Last Alcohol Screening Score (AUDIT): 1  Housing: Low Risk (09/14/2023)   Epic    Unable to Pay for Housing in the Last Year: No    Number of Times Moved in the Last Year: 0    Homeless in the Last Year: No  Utilities: Not on file  Health Literacy: Not on file    Family History  Problem Relation Age of Onset   Cancer Paternal Grandfather        thyroid    Cancer Paternal Grandmother        ovarian   Diabetes Maternal Grandmother    Hypertension Maternal Grandmother    Heart disease Maternal Grandmother    Hypertension Mother    Diabetes Mother    Diabetes Brother     Outpatient Encounter Medications as of 03/15/2024  Medication Sig   acetaminophen  (TYLENOL ) 325 MG tablet Take 2 tablets (650 mg total) by mouth every 6 (six) hours as needed for moderate pain or mild pain.   amLODipine  (NORVASC ) 5 MG tablet Take 1 tablet (5 mg total) by mouth daily.   HYDROcodone -acetaminophen  (NORCO/VICODIN) 5-325 MG tablet Take 1 every 6-8 hours for pain is not relieved by Tylenol  or Motrin  alone   hydrOXYzine  (ATARAX ) 10 MG tablet Take 1 tablet (10 mg total) by mouth 2 (two) times daily as needed for anxiety (panic).   ibuprofen  (ADVIL ) 800 MG tablet Take 1 tablet (800 mg total) by mouth 3 (three) times daily. (Patient taking differently: Take 800 mg by mouth 3 (three) times daily. As needed)   lamoTRIgine  (LAMICTAL ) 100 MG tablet Take 1 tablet (100 mg total) by mouth daily.   tirzepatide  (ZEPBOUND ) 15 MG/0.5ML Pen Inject 15 mg into the skin once a week.   tretinoin  (RETIN-A ) 0.025 % cream Apply topically at  bedtime. (Patient taking differently: Apply topically at bedtime. As needed)   Vitamin D , Ergocalciferol , (DRISDOL ) 1.25 MG (50000 UNIT) CAPS capsule Take 1 capsule (50,000 Units total) by mouth every 7 (seven) days.   [DISCONTINUED] levothyroxine  (SYNTHROID ) 150 MCG tablet Take 1 tablet (150 mcg total) by mouth daily before breakfast.   levothyroxine  (SYNTHROID ) 137 MCG tablet Take 1 tablet (137 mcg total) by mouth daily before breakfast.   [DISCONTINUED] hydrochlorothiazide  (HYDRODIURIL ) 12.5 MG tablet TAKE 1 TABLET(12.5 MG) BY MOUTH DAILY (Patient not taking: Reported on 03/15/2024)   [DISCONTINUED] imipramine  (TOFRANIL ) 50 MG tablet Take 1  tablet (50 mg total) by mouth at bedtime. (Patient not taking: Reported on 03/15/2024)   [DISCONTINUED] lidocaine  (XYLOCAINE ) 2 % solution Gargle and spit 5 mL every 6 hours as needed for throat pain or discomfort. (Patient not taking: Reported on 03/15/2024)   [DISCONTINUED] promethazine -dextromethorphan (PROMETHAZINE -DM) 6.25-15 MG/5ML syrup Take 5 mLs by mouth 4 (four) times daily as needed. (Patient not taking: Reported on 03/15/2024)   No facility-administered encounter medications on file as of 03/15/2024.    ALLERGIES: Allergies  Allergen Reactions   Levaquin [Levofloxacin In D5w] Anaphylaxis   Levofloxacin Anaphylaxis   Pertussis Vaccines Swelling    T-Dap injection caused localized swelling of arm. Has had Tetanus before without problems.   Megace  [Megestrol ] Other (See Comments)    Cause Depression   Tetanus-Diphth-Acell Pertussis Swelling   Venofer  [Iron  Sucrose] Other (See Comments)    Chest Pain    VACCINATION STATUS: Immunization History  Administered Date(s) Administered   DTaP 09/09/1983, 11/18/1983, 02/03/1984, 01/25/1985, 10/29/1988   Hepatitis B 12/09/1994, 01/14/1995, 06/16/1995   IPV 09/09/1983, 11/18/1983, 02/03/1984, 01/25/1985   Influenza,inj,Quad PF,6+ Mos 11/07/2020, 12/18/2021   Influenza-Unspecified 12/18/2002, 01/23/2014    MMR 10/28/1984, 12/26/2004   Moderna Sars-Covid-2 Vaccination 12/19/2019, 01/16/2020   Tdap 12/26/2004     HPI  Judi Jaffe Handa is 39 y.o. female who presents today with a medical history as above. she is being seen in follow up after being seen in consultation for hyperthyroidism requested by Bluford Jacqulyn MATSU, DO.   she denies dysphagia, choking, shortness of breath, no recent voice change.    she does family history of thyroid  dysfunction in her grandmother (Graves disease) and mother (MNG requiring thyroidectomy), but denies family hx of thyroid  cancer. she denies personal history of goiter. she is not on any anti-thyroid  medications nor on any thyroid  hormone supplements. She does intermittently use of Biotin containing supplements but has not taken it in a few weeks.   She underwent RAI ablation for Graves disease on 11/15/20 and has since been started on thyroid  hormone replacement.  Review of systems  Constitutional: + decreasing body weight,  current Body mass index is 34.41 kg/m. , no fatigue, no subjective hyperthermia, no subjective hypothermia Eyes: no blurry vision, no xerophthalmia ENT: no sore throat, no nodules palpated in throat, no dysphagia/odynophagia, no hoarseness Cardiovascular: no chest pain, no shortness of breath, + palpitations-worsening recently, no leg swelling Respiratory: no cough, no shortness of breath Gastrointestinal: no vomiting or diarrhea, + nausea and constipation (from Zepbound )- stable Musculoskeletal: no muscle/joint aches Skin: no rashes, no hyperemia Neurological: no tremors, no numbness, no tingling, no dizziness Psychiatric: no depression, + anxiety   Objective:    BP 108/74 (BP Location: Left Arm, Patient Position: Sitting, Cuff Size: Large)   Pulse 94   Ht 5' 5 (1.651 m)   Wt 206 lb 12.8 oz (93.8 kg)   LMP 04/30/2021   BMI 34.41 kg/m   Wt Readings from Last 3 Encounters:  03/15/24 206 lb 12.8 oz (93.8 kg)  03/10/24 205 lb  (93 kg)  11/11/23 230 lb 3.2 oz (104.4 kg)     BP Readings from Last 3 Encounters:  03/15/24 108/74  03/10/24 125/89  03/10/24 (!) 134/98                        Physical Exam- Limited  Constitutional:  Body mass index is 34.41 kg/m. , not in acute distress, normal state of mind Eyes:  EOMI, no exophthalmos Musculoskeletal: no  gross deformities, strength intact in all four extremities, no gross restriction of joint movements Skin:  no rashes, no hyperemia Neurological: no tremor with outstretched hands   CMP     Component Value Date/Time   NA 143 03/10/2024 1831   NA 139 03/18/2023 0818   K 3.9 03/10/2024 1831   CL 107 03/10/2024 1831   CO2 21 (L) 03/10/2024 1831   GLUCOSE 82 03/10/2024 1831   BUN 14 03/10/2024 1831   BUN 9 03/18/2023 0818   CREATININE 0.92 03/10/2024 1831   CALCIUM 9.4 03/10/2024 1831   PROT 7.9 03/10/2024 1831   PROT 7.3 03/18/2023 0818   ALBUMIN 4.6 03/10/2024 1831   ALBUMIN 4.6 03/18/2023 0818   AST 20 03/10/2024 1831   ALT 21 03/10/2024 1831   ALKPHOS 72 03/10/2024 1831   BILITOT 0.4 03/10/2024 1831   BILITOT 0.5 03/18/2023 0818   GFRNONAA >60 03/10/2024 1831   GFRAA >60 09/22/2019 1938     CBC    Component Value Date/Time   WBC 9.7 03/10/2024 1831   RBC 5.11 03/10/2024 1831   HGB 15.0 03/10/2024 1831   HGB 14.6 03/18/2023 0818   HCT 43.4 03/10/2024 1831   HCT 45.5 03/18/2023 0818   PLT 303 03/10/2024 1831   PLT 311 03/18/2023 0818   MCV 84.9 03/10/2024 1831   MCV 89 03/18/2023 0818   MCH 29.4 03/10/2024 1831   MCHC 34.6 03/10/2024 1831   RDW 12.5 03/10/2024 1831   RDW 12.6 03/18/2023 0818   LYMPHSABS 2.5 03/10/2024 1831   LYMPHSABS 1.9 03/18/2023 0818   MONOABS 0.6 03/10/2024 1831   EOSABS 0.1 03/10/2024 1831   EOSABS 0.2 03/18/2023 0818   BASOSABS 0.1 03/10/2024 1831   BASOSABS 0.1 03/18/2023 0818     Diabetic Labs (most recent): Lab Results  Component Value Date   HGBA1C 5.5 03/18/2023   HGBA1C 5.2 11/06/2022    HGBA1C 5.3 05/13/2022    Lipid Panel     Component Value Date/Time   CHOL 194 03/18/2023 0818   TRIG 121 03/18/2023 0818   HDL 48 03/18/2023 0818   CHOLHDL 4.0 03/18/2023 0818   LDLCALC 124 (H) 03/18/2023 0818   LABVLDL 22 03/18/2023 0818     Lab Results  Component Value Date   TSH 0.580 03/06/2024   TSH 0.341 (L) 11/05/2023   TSH 1.730 07/05/2023   TSH 2.560 03/18/2023   TSH 2.370 03/18/2023   TSH 0.037 (L) 11/06/2022   TSH 0.025 (L) 08/05/2022   TSH 8.650 (H) 05/13/2022   TSH 6.940 (H) 03/19/2022   TSH 3.980 12/01/2021   FREET4 1.66 03/06/2024   FREET4 1.99 (H) 11/05/2023   FREET4 1.13 07/05/2023   FREET4 1.38 03/18/2023   FREET4 1.36 03/18/2023   FREET4 1.69 11/06/2022   FREET4 2.40 (H) 08/05/2022   FREET4 1.37 05/13/2022   FREET4 1.37 03/19/2022   FREET4 1.63 12/01/2021     Uptake and Scan from 10/17/20 CLINICAL DATA:  Hyperthyroidism, TSH 0.450   EXAM: THYROID  SCAN AND UPTAKE - 4 AND 24 HOURS   TECHNIQUE: Following oral administration of I-123 capsule, anterior planar imaging was acquired at 24 hours. Thyroid  uptake was calculated with a thyroid  probe at 4-6 hours and 24 hours.   RADIOPHARMACEUTICALS:  436 uCi I-123 sodium iodide p.o.   COMPARISON:  None   FINDINGS: Homogeneous tracer distribution in both thyroid  lobes.   No focal areas of increased or decreased tracer localization.   4 hour I-123 uptake = 14.1% (normal 5-20%)  24 hour I-123 uptake = 31.1% (normal 10-30%)   IMPRESSION: Normal thyroid  scan.   Minimally elevated 24 hour radio iodine  uptake.   In the setting of hyperthyroidism, findings suggest Graves disease     Electronically Signed   By: Oneil Kiss M.D.   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  -She underwent RAI ablation on 11/15/20.    Her previsit TFTs are consistent with over-replacement (she does have associated symptoms as well), likely related to weight loss from use of Zepbound .  She is advised to lower Levothyroxine  to 137 mcg po daily before breakfast.  Will recheck labs in 4 months and adjust dose accordingly.   - The correct intake of thyroid  hormone (Levothyroxine , Synthroid ), is on empty stomach first thing in the morning, with water , separated by at least 30 minutes from breakfast and other medications,  and separated by more than 4 hours from calcium, iron , multivitamins, acid reflux medications (PPIs).  - This medication is a life-long medication and will be needed to correct thyroid  hormone imbalances for the rest of your life.  The dose may change from time to time, based on thyroid  blood work.  - It is extremely important to be consistent taking this medication, near the same time each morning.  -AVOID TAKING PRODUCTS CONTAINING BIOTIN (commonly found in Hair, Skin, Nails vitamins) AS IT INTERFERES WITH THE VALIDITY OF THYROID  FUNCTION BLOOD TESTS.          -Patient is advised to maintain close follow up with Cook, Jayce G, DO for primary care needs.  I advised her to reach out to Dr. Bluford regarding her wound.  Does not appear to be infected.     I spent  26  minutes in the care of the patient today including review of labs from Thyroid  Function, CMP, and other relevant labs ; imaging/biopsy records (current and previous including abstractions from other facilities); face-to-face time discussing  her lab results and symptoms, medications doses, her options of short and long term treatment based on the latest standards of care / guidelines;   and documenting  the encounter.  Corean BIRCH Childers  participated in the discussions, expressed understanding, and voiced agreement with the above plans.  All questions were answered to her satisfaction. she is encouraged to contact clinic should she have any questions or concerns prior to her return visit.    Follow up plan: Return in about 4 months (around 07/13/2024) for Thyroid  follow up, Previsit labs.   Thank you for involving me in the care of this pleasant patient, and I will continue to update you with her progress.   Benton Rio, [facility] [address] [place], [place] [address] Phone: [phone] Fax: [phone]  03/15/2024, 8:47 AM "

## 2024-03-15 NOTE — Telephone Encounter (Signed)
 See patient message about potentially running a few minutes late

## 2024-03-20 ENCOUNTER — Ambulatory Visit: Admitting: Family Medicine

## 2024-03-20 VITALS — BP 127/82 | HR 92 | Temp 94.8°F | Ht 65.0 in | Wt 207.0 lb

## 2024-03-20 DIAGNOSIS — E039 Hypothyroidism, unspecified: Secondary | ICD-10-CM

## 2024-03-20 DIAGNOSIS — G43109 Migraine with aura, not intractable, without status migrainosus: Secondary | ICD-10-CM

## 2024-03-20 DIAGNOSIS — G43909 Migraine, unspecified, not intractable, without status migrainosus: Secondary | ICD-10-CM | POA: Insufficient documentation

## 2024-03-20 DIAGNOSIS — E66811 Obesity, class 1: Secondary | ICD-10-CM

## 2024-03-20 DIAGNOSIS — I10 Essential (primary) hypertension: Secondary | ICD-10-CM

## 2024-03-20 MED ORDER — RIZATRIPTAN BENZOATE 10 MG PO TABS: 10.0000 mg | ORAL_TABLET | ORAL | 3 refills | Status: AC | PRN

## 2024-03-20 NOTE — Patient Instructions (Signed)
 Medication as directed for migraine. If continue to be problematic, we can try and get Nurtec approved.  Continue your other medications.  Follow up in 6 months.  Take care  Dr. Bluford

## 2024-03-20 NOTE — Progress Notes (Signed)
 "  Subjective:  Patient ID: Paula Myers, female    DOB: 1985-08-27  Age: 39 y.o. MRN: [id number]  CC:   Chief Complaint  Patient presents with   Follow-up    No C/O with zepbound  medications.  PT c/o of migraines that have got worse over the past year, went to ER for it and chest pain, cleared but referred to cardiology in march.    HPI:  39 year old female presents for follow-up.  Recent urgent care visit and subsequent ER visit for chest pain.  Had normal EKG and normal troponins.  Patient has also had ongoing headaches.  She states that about every week to 2 weeks she has a migraine.  Located in the frontal region and behind the eyes.  Associated nausea and photophobia.  She is taking NSAIDs, Goody powder, and Tylenol .   Hypertension stable on amlodipine .  Patient has had significant weight loss with tours appetite.  Doing well.  Hypothyroidism stable.  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   PTSD (post-traumatic stress disorder) 09/15/2022   Vitamin D  deficiency 09/15/2022   Hypothyroidism 05/11/2022   Generalized anxiety disorder with panic attacks 05/11/2022   Vapes nicotine containing substance with history of cigarette smoking 06/17/2021   Hypertension 04/10/2021   Cervical high risk HPV (human papillomavirus) test positive 07/31/2016   Major depressive disorder, recurrent episode, moderate (HCC) 10/02/2015    Social Hx   Social History   Socioeconomic History   Marital status: Married    Spouse name: Not on file   Number of children: 3   Years of education: Not on file   Highest education level: Associate degree: academic program  Occupational History   Occupation: [employer]    Comment: Dispatcher  Tobacco  Use   Smoking status: Every Day    Types: E-cigarettes   Smokeless tobacco: Never   Tobacco comments:    Quit cigarettes in February 2024. Vape cartridge currently lasts 1 month at a time  Vaping Use   Vaping status: Every Day  Substance and Sexual Activity   Alcohol use: Not Currently    Comment: maybe 1-2 per year   Drug use: Never   Sexual activity: Yes    Birth control/protection: Surgical    Comment: hyst  Other Topics Concern   Not on file  Social History Narrative   Not on file   Social Drivers of Health   Tobacco Use: High Risk (03/20/2024)   Patient History    Smoking Tobacco Use: Every Day    Smokeless Tobacco Use: Never    Passive Exposure: Not on file  Financial Resource Strain: Low Risk (03/20/2024)   Overall Financial Resource Strain (CARDIA)    Difficulty of Paying Living Expenses: Not very hard  Food Insecurity: No Food Insecurity (03/20/2024)   Epic    Worried About Programme Researcher, Broadcasting/film/video in the Last Year: Never true    Ran Out of Food in the Last Year: Never true  Transportation Needs: No Transportation Needs (03/20/2024)   Epic    Lack of Transportation (Medical): No    Lack of Transportation (Non-Medical): No  Physical Activity: Sufficiently Active (03/20/2024)   Exercise Vital Sign    Days of Exercise per Week: 4 days    Minutes  of Exercise per Session: 60 min  Stress: Stress Concern Present (03/20/2024)   [employer] of Occupational Health - Occupational Stress Questionnaire    Feeling of Stress: To some extent  Social Connections: Moderately Integrated (03/20/2024)   Social Connection and Isolation Panel    Frequency of Communication with Friends and Family: Three times a week    Frequency of Social Gatherings with Friends and Family: Once a week    Attends Religious Services: 1 to 4 times per year    Active Member of Clubs or Organizations: No    Attends Banker Meetings: Not on file    Marital Status: Married  Depression (PHQ2-9):  Medium Risk (03/20/2024)   Depression (PHQ2-9)    PHQ-2 Score: 7  Alcohol Screen: Low Risk (09/14/2023)   Alcohol Screen    Last Alcohol Screening Score (AUDIT): 1  Housing: Low Risk (03/20/2024)   Epic    Unable to Pay for Housing in the Last Year: No    Number of Times Moved in the Last Year: 0    Homeless in the Last Year: No  Utilities: Not on file  Health Literacy: Not on file    Review of Systems Per HPI  Objective:  BP 127/82   Pulse 92   Temp (!) 94.8 F (34.9 C)   Ht 5' 5 (1.651 m)   Wt 207 lb (93.9 kg)   LMP 04/30/2021   SpO2 100%   BMI 34.45 kg/m      03/20/2024    8:32 AM 03/15/2024    8:08 AM 03/10/2024   11:15 PM  BP/Weight  Systolic BP 127 108 125  Diastolic BP 82 74 89  Wt. (Lbs) 207 206.8   BMI 34.45 kg/m2 34.41 kg/m2     Physical Exam Vitals and nursing note reviewed.  Constitutional:      General: She is not in acute distress.    Appearance: Normal appearance.  HENT:     Head: Normocephalic and atraumatic.  Eyes:     General:        Right eye: No discharge.        Left eye: No discharge.     Conjunctiva/sclera: Conjunctivae normal.  Cardiovascular:     Rate and Rhythm: Normal rate and regular rhythm.  Pulmonary:     Effort: Pulmonary effort is normal.     Breath sounds: Normal breath sounds. No wheezing, rhonchi or rales.  Neurological:     Mental Status: She is alert.  Psychiatric:        Mood and Affect: Mood normal.        Behavior: Behavior normal.     Lab Results  Component Value Date   WBC 9.7 03/10/2024   HGB 15.0 03/10/2024   HCT 43.4 03/10/2024   PLT 303 03/10/2024   GLUCOSE 82 03/10/2024   CHOL 194 03/18/2023   TRIG 121 03/18/2023   HDL 48 03/18/2023   LDLCALC 124 (H) 03/18/2023   ALT 21 03/10/2024   AST 20 03/10/2024   NA 143 03/10/2024   K 3.9 03/10/2024   CL 107 03/10/2024   CREATININE 0.92 03/10/2024   BUN 14 03/10/2024   CO2 21 (L) 03/10/2024   TSH 0.580 03/06/2024   INR 1.1 10/27/2020   HGBA1C 5.5  03/18/2023     Assessment & Plan:  Hypertension, unspecified type Assessment & Plan: Stable.  Continue amlodipine .   Hypothyroidism, unspecified type Assessment & Plan: Stable.  Continue current dosing levothyroxine .   Migraine  with aura and without status migrainosus, not intractable Assessment & Plan: Maxalt  as directed.  If continues to be frequent, we will see if we can get prior authorization for Nurtec.  Orders: -     Rizatriptan  Benzoate; Take 1 tablet (10 mg total) by mouth as needed for migraine. May repeat in 2 hours if needed  Dispense: 10 tablet; Refill: 3  Obesity (BMI 30.0-34.9) Assessment & Plan: Has lost a significant amount of weight Mounjaro .  Continue.     Follow-up:  Return in about 6 months (around 09/17/2024).  Jacqulyn Ahle DO [facility] "

## 2024-03-20 NOTE — Assessment & Plan Note (Signed)
Stable. °-Continue amlodipine °

## 2024-03-20 NOTE — Assessment & Plan Note (Signed)
 Stable.  Continue current dosing levothyroxine.

## 2024-03-20 NOTE — Assessment & Plan Note (Signed)
 Has lost a significant amount of weight Mounjaro .  Continue.

## 2024-03-20 NOTE — Assessment & Plan Note (Signed)
 Maxalt  as directed.  If continues to be frequent, we will see if we can get prior authorization for Nurtec.

## 2024-05-03 ENCOUNTER — Ambulatory Visit: Admitting: Cardiology

## 2024-07-19 ENCOUNTER — Ambulatory Visit: Admitting: Nurse Practitioner

## 2024-09-18 ENCOUNTER — Ambulatory Visit: Admitting: Family Medicine
